# Patient Record
Sex: Male | Born: 1945
Health system: Southern US, Community
[De-identification: ages and names within clinical notes are randomized; demographics above are authoritative.]

## PROBLEM LIST (undated history)

## (undated) DIAGNOSIS — I2510 Atherosclerotic heart disease of native coronary artery without angina pectoris: Secondary | ICD-10-CM

## (undated) DIAGNOSIS — I10 Essential (primary) hypertension: Secondary | ICD-10-CM

## (undated) DIAGNOSIS — R079 Chest pain, unspecified: Secondary | ICD-10-CM

## (undated) DIAGNOSIS — N200 Calculus of kidney: Secondary | ICD-10-CM

## (undated) DIAGNOSIS — K760 Fatty (change of) liver, not elsewhere classified: Secondary | ICD-10-CM

## (undated) DIAGNOSIS — G629 Polyneuropathy, unspecified: Secondary | ICD-10-CM

## (undated) DIAGNOSIS — E785 Hyperlipidemia, unspecified: Secondary | ICD-10-CM

## (undated) HISTORY — DX: Essential (primary) hypertension: I10

## (undated) HISTORY — DX: Chest pain, unspecified: R07.9

## (undated) HISTORY — PX: CATARACT EXTRACTION: SUR2

## (undated) HISTORY — DX: Polyneuropathy, unspecified: G62.9

## (undated) HISTORY — PX: TONSILLECTOMY: SUR1361

## (undated) HISTORY — DX: Atherosclerotic heart disease of native coronary artery without angina pectoris: I25.10

## (undated) HISTORY — DX: Hyperlipidemia, unspecified: E78.5

## (undated) HISTORY — DX: Calculus of kidney: N20.0

---

## 2005-11-26 ENCOUNTER — Ambulatory Visit: Payer: Self-pay | Admitting: Gastroenterology

## 2007-03-16 DIAGNOSIS — G4733 Obstructive sleep apnea (adult) (pediatric): Secondary | ICD-10-CM | POA: Insufficient documentation

## 2007-03-16 HISTORY — DX: Obstructive sleep apnea (adult) (pediatric): G47.33

## 2010-02-12 HISTORY — PX: LEG SURGERY: SHX1003

## 2010-02-12 HISTORY — PX: FOOT SURGERY: SHX648

## 2019-03-07 DIAGNOSIS — Z20822 Contact with and (suspected) exposure to covid-19: Secondary | ICD-10-CM | POA: Diagnosis not present

## 2019-03-07 DIAGNOSIS — I10 Essential (primary) hypertension: Secondary | ICD-10-CM | POA: Diagnosis not present

## 2019-05-19 DIAGNOSIS — R7301 Impaired fasting glucose: Secondary | ICD-10-CM | POA: Diagnosis not present

## 2019-05-19 DIAGNOSIS — G4733 Obstructive sleep apnea (adult) (pediatric): Secondary | ICD-10-CM | POA: Diagnosis not present

## 2019-05-19 DIAGNOSIS — Z9989 Dependence on other enabling machines and devices: Secondary | ICD-10-CM | POA: Diagnosis not present

## 2019-05-19 DIAGNOSIS — I10 Essential (primary) hypertension: Secondary | ICD-10-CM | POA: Diagnosis not present

## 2019-05-19 DIAGNOSIS — Z125 Encounter for screening for malignant neoplasm of prostate: Secondary | ICD-10-CM | POA: Diagnosis not present

## 2019-05-19 DIAGNOSIS — E785 Hyperlipidemia, unspecified: Secondary | ICD-10-CM | POA: Diagnosis not present

## 2019-05-22 DIAGNOSIS — C44319 Basal cell carcinoma of skin of other parts of face: Secondary | ICD-10-CM | POA: Diagnosis not present

## 2019-06-23 DIAGNOSIS — M109 Gout, unspecified: Secondary | ICD-10-CM | POA: Diagnosis not present

## 2019-07-14 DIAGNOSIS — J301 Allergic rhinitis due to pollen: Secondary | ICD-10-CM | POA: Diagnosis not present

## 2019-08-06 DIAGNOSIS — N200 Calculus of kidney: Secondary | ICD-10-CM | POA: Diagnosis not present

## 2019-08-06 DIAGNOSIS — Z79899 Other long term (current) drug therapy: Secondary | ICD-10-CM | POA: Diagnosis not present

## 2019-08-06 DIAGNOSIS — I10 Essential (primary) hypertension: Secondary | ICD-10-CM | POA: Diagnosis not present

## 2019-08-06 DIAGNOSIS — R109 Unspecified abdominal pain: Secondary | ICD-10-CM | POA: Diagnosis not present

## 2019-08-06 DIAGNOSIS — E7800 Pure hypercholesterolemia, unspecified: Secondary | ICD-10-CM | POA: Diagnosis not present

## 2019-08-06 DIAGNOSIS — R5383 Other fatigue: Secondary | ICD-10-CM | POA: Diagnosis not present

## 2019-08-07 DIAGNOSIS — E860 Dehydration: Secondary | ICD-10-CM | POA: Diagnosis not present

## 2019-08-07 DIAGNOSIS — N201 Calculus of ureter: Secondary | ICD-10-CM | POA: Diagnosis not present

## 2019-08-07 DIAGNOSIS — R109 Unspecified abdominal pain: Secondary | ICD-10-CM | POA: Diagnosis not present

## 2019-08-07 DIAGNOSIS — R0600 Dyspnea, unspecified: Secondary | ICD-10-CM | POA: Diagnosis not present

## 2019-08-20 ENCOUNTER — Ambulatory Visit: Payer: Self-pay | Admitting: Cardiology

## 2019-08-25 ENCOUNTER — Telehealth: Payer: Self-pay | Admitting: Cardiology

## 2019-08-25 DIAGNOSIS — R079 Chest pain, unspecified: Secondary | ICD-10-CM | POA: Diagnosis not present

## 2019-08-25 DIAGNOSIS — I10 Essential (primary) hypertension: Secondary | ICD-10-CM | POA: Diagnosis not present

## 2019-08-25 DIAGNOSIS — N179 Acute kidney failure, unspecified: Secondary | ICD-10-CM | POA: Diagnosis not present

## 2019-08-25 DIAGNOSIS — E785 Hyperlipidemia, unspecified: Secondary | ICD-10-CM | POA: Diagnosis not present

## 2019-08-25 DIAGNOSIS — R0602 Shortness of breath: Secondary | ICD-10-CM | POA: Diagnosis not present

## 2019-08-25 DIAGNOSIS — Z7982 Long term (current) use of aspirin: Secondary | ICD-10-CM | POA: Diagnosis not present

## 2019-08-25 DIAGNOSIS — Z79899 Other long term (current) drug therapy: Secondary | ICD-10-CM | POA: Diagnosis not present

## 2019-08-25 DIAGNOSIS — I361 Nonrheumatic tricuspid (valve) insufficiency: Secondary | ICD-10-CM | POA: Diagnosis not present

## 2019-08-25 DIAGNOSIS — J8410 Pulmonary fibrosis, unspecified: Secondary | ICD-10-CM | POA: Diagnosis not present

## 2019-08-25 NOTE — Progress Notes (Deleted)
Cardiology Office Note:    Date:  08/25/2019   ID:  Phillip Terry, DOB 1945-04-28, MRN [id number]  PCP:  No primary care provider on file.  Cardiologist:  Norman Herrlich, MD   Referring MD: Paulina Fusi, MD  ASSESSMENT:    No diagnosis found. PLAN:    In order of problems listed above:  1. ***  Next appointment   Medication Adjustments/Labs and Tests Ordered: Current medicines are reviewed at length with the patient today.  Concerns regarding medicines are outlined above.  No orders of the defined types were placed in this encounter.  No orders of the defined types were placed in this encounter.    No chief complaint on file. ***  History of Present Illness:    Phillip Terry is a 74 y.o. male who is being seen today for the evaluation of shortness of breath at the request of Paulina Fusi, MD.   No past medical history on file.  *** The histories are not reviewed yet. Please review them in the "History" navigator section and refresh this SmartLink.  Current Medications: No outpatient medications have been marked as taking for the 08/26/19 encounter (Appointment) with Baldo Daub, MD.     Allergies:   Patient has no allergy information on record.   Social History   Socioeconomic History  . Marital status: Married    Spouse name: Not on file  . Number of children: Not on file  . Years of education: Not on file  . Highest education level: Not on file  Occupational History  . Not on file  Tobacco Use  . Smoking status: Not on file  Substance and Sexual Activity  . Alcohol use: Not on file  . Drug use: Not on file  . Sexual activity: Not on file  Other Topics Concern  . Not on file  Social History Narrative  . Not on file   Social Determinants of Health   Financial Resource Strain:   . Difficulty of Paying Living Expenses:   Food Insecurity:   . Worried About Programme researcher, broadcasting/film/video in the Last Year:   . Barista in the Last Year:     Transportation Needs:   . Freight forwarder (Medical):   [name] Lack of Transportation (Non-Medical):   Physical Activity:   . Days of Exercise per Week:   . Minutes of Exercise per Session:   Stress:   . Feeling of Stress :   Social Connections:   . Frequency of Communication with Friends and Family:   . Frequency of Social Gatherings with Friends and Family:   . Attends Religious Services:   . Active Member of Clubs or Organizations:   . Attends Banker Meetings:   [name] Marital Status:      Family History: The patient's ***family history is not on file.  ROS:   ROS Please see the history of present illness.    *** All other systems reviewed and are negative.  EKGs/Labs/Other Studies Reviewed:    The following studies were reviewed today: ***  EKG:  EKG is *** ordered today.  The ekg ordered today is personally reviewed and demonstrates ***  Recent Labs: No results found for requested labs within last 8760 hours.  Recent Lipid Panel No results found for: CHOL, TRIG, HDL, CHOLHDL, VLDL, LDLCALC, LDLDIRECT  Physical Exam:    VS:  There were no vitals taken for this visit.    Wt Readings from Last  3 Encounters:  No data found for Wt     GEN: *** Well nourished, well developed in no acute distress HEENT: Normal NECK: No JVD; No carotid bruits LYMPHATICS: No lymphadenopathy CARDIAC: ***RRR, no murmurs, rubs, gallops RESPIRATORY:  Clear to auscultation without rales, wheezing or rhonchi  ABDOMEN: Soft, non-tender, non-distended MUSCULOSKELETAL:  No edema; No deformity  SKIN: Warm and dry NEUROLOGIC:  Alert and oriented x 3 PSYCHIATRIC:  Normal affect     Signed, Norman Herrlich, MD  08/25/2019 10:07 AM    [facility]

## 2019-08-25 NOTE — Telephone Encounter (Signed)
New Message:     Wife called and wanted Dr Dulce Sellar know that pt have been admitted to [facility]. They are keeping him over night and will do a Stress Test tomorrow morning. She did not cancel his appt for tomorrow, in case he is discharged.

## 2019-08-26 ENCOUNTER — Ambulatory Visit: Payer: Self-pay | Admitting: Cardiology

## 2019-08-26 DIAGNOSIS — E785 Hyperlipidemia, unspecified: Secondary | ICD-10-CM | POA: Diagnosis not present

## 2019-08-26 DIAGNOSIS — I361 Nonrheumatic tricuspid (valve) insufficiency: Secondary | ICD-10-CM | POA: Diagnosis not present

## 2019-08-26 DIAGNOSIS — I10 Essential (primary) hypertension: Secondary | ICD-10-CM | POA: Diagnosis not present

## 2019-08-26 DIAGNOSIS — R0602 Shortness of breath: Secondary | ICD-10-CM | POA: Diagnosis not present

## 2019-08-26 DIAGNOSIS — R079 Chest pain, unspecified: Secondary | ICD-10-CM | POA: Diagnosis not present

## 2019-08-27 DIAGNOSIS — R079 Chest pain, unspecified: Secondary | ICD-10-CM | POA: Diagnosis not present

## 2019-08-27 DIAGNOSIS — R0600 Dyspnea, unspecified: Secondary | ICD-10-CM | POA: Diagnosis not present

## 2019-08-27 DIAGNOSIS — J8410 Pulmonary fibrosis, unspecified: Secondary | ICD-10-CM | POA: Diagnosis not present

## 2019-08-28 ENCOUNTER — Encounter: Payer: Self-pay | Admitting: *Deleted

## 2019-08-28 ENCOUNTER — Encounter: Payer: Self-pay | Admitting: Cardiology

## 2019-08-28 DIAGNOSIS — I10 Essential (primary) hypertension: Secondary | ICD-10-CM | POA: Insufficient documentation

## 2019-08-28 DIAGNOSIS — G629 Polyneuropathy, unspecified: Secondary | ICD-10-CM | POA: Insufficient documentation

## 2019-08-28 DIAGNOSIS — N200 Calculus of kidney: Secondary | ICD-10-CM | POA: Insufficient documentation

## 2019-08-28 DIAGNOSIS — C44311 Basal cell carcinoma of skin of nose: Secondary | ICD-10-CM | POA: Diagnosis not present

## 2019-08-28 DIAGNOSIS — E785 Hyperlipidemia, unspecified: Secondary | ICD-10-CM | POA: Insufficient documentation

## 2019-08-28 DIAGNOSIS — D171 Benign lipomatous neoplasm of skin and subcutaneous tissue of trunk: Secondary | ICD-10-CM | POA: Diagnosis not present

## 2019-09-03 DIAGNOSIS — N401 Enlarged prostate with lower urinary tract symptoms: Secondary | ICD-10-CM | POA: Diagnosis not present

## 2019-09-03 DIAGNOSIS — N200 Calculus of kidney: Secondary | ICD-10-CM | POA: Diagnosis not present

## 2019-09-15 NOTE — Progress Notes (Signed)
Cardiology Office Note:    Date:  09/16/2019   ID:  Phillip Terry, DOB 06/19/1945, MRN [id number]  PCP:  Paulina Fusi, MD  Cardiologist:  Norman Herrlich, MD   Referring MD: Paulina Fusi, MD  ASSESSMENT:    1. Pulmonary fibrosis (HCC)   2. Hypertension, unspecified type   3. Hyperlipidemia, unspecified hyperlipidemia type    PLAN:    In order of problems listed above:  1. He remains symptomatic with exertional shortness of breath evaluation showed pulmonary fibrosis strong exposure to asbestos in his life awaiting pulmonary evaluation and referred to pulmonary rehab to improve the quality of his life 2. Stable BP at target continue current ARB calcium channel blocker beta-blocker 3. Stable continue nonstatin  Next appointment I will see back in the office as needed   Medication Adjustments/Labs and Tests Ordered: Current medicines are reviewed at length with the patient today.  Concerns regarding medicines are outlined above.  Orders Placed This Encounter  Procedures  . EKG 12-Lead   No orders of the defined types were placed in this encounter.    Chief Complaint  Patient presents with  . Shortness of Breath    He had been at [facility] was seen by me had an evaluation showing no findings of heart failure and a normal myocardial perfusion test    History of Present Illness:    Phillip Terry is a 74 y.o. male who is being seen today for the evaluation of shortness of breath at the request of Paulina Fusi, MD.  Review of office records from his primary care physician 08/06/2009 had recent visit to [facility] ED leg pain.  At that time he is also complained of exertional shortness of breath.  It was noted that his proBNP level was normal and EKG in the physician's office was also read as normal.  There is also notation that he is being seen [facility] pulmonary consultation with a diagnosis of pulmonary fibrosis.  He has a history of obstructive  sleep apnea on CPAP.  He was seen by me at [facility] during admission 08/26/2019.  His problem was progressive severe exertional shortness of breath proBNP level was quite low at 62 echo showed normal left and right ventricular function no evidence of elevated pressures or pulmonary artery hypertension underwent a myocardial perfusion study showed an EF of 56% normal perfusion.  D-dimer was low.  CT of the chest showed pulmonary fibrosis also showed coronary artery calcification.  He is disturbed by his hospital experience and just is unsure what it is and what is not wrong.  I told him his cardiac testing was reassuring no evidence of heart failure or CAD.  He has an appointment to be seen by pulmonary at [facility] and when I take the opportunity refer him to pulmonary rehab to improve the quality of his life.  I do not think he had full PFTs at the hospital.  Past Medical History:  Diagnosis Date  . Calculus of kidney   . Hyperlipidemia   . Hypertension   . OSA on CPAP 03/16/2007   RDI 39 Sat 89%  . Peripheral neuropathy     Past Surgical History:  Procedure Laterality Date  . CATARACT EXTRACTION Bilateral   . FOOT SURGERY Left 2012   gastronemius slide procedure  . LEG SURGERY Left 2012   Gastronemius slide procedure  . TONSILLECTOMY      Current Medications: No outpatient medications have been marked  as taking for the 09/16/19 encounter (Office Visit) with Baldo Daub, MD.     Allergies:   Patient has no known allergies.   Social History   Socioeconomic History  . Marital status: Married    Spouse name: Not on file  . Number of children: Not on file  . Years of education: Not on file  . Highest education level: Not on file  Occupational History  . Not on file  Tobacco Use  . Smoking status: Never Smoker  . Smokeless tobacco: Never Used  Substance and Sexual Activity  . Alcohol use: Never  . Drug use: Never  . Sexual activity: Not on file  Other  Topics Concern  . Not on file  Social History Narrative  . Not on file   Social Determinants of Health   Financial Resource Strain:   . Difficulty of Paying Living Expenses:   Food Insecurity:   . Worried About Programme researcher, broadcasting/film/video in the Last Year:   . Barista in the Last Year:   Transportation Needs:   . Freight forwarder (Medical):   [name] Lack of Transportation (Non-Medical):   Physical Activity:   . Days of Exercise per Week:   . Minutes of Exercise per Session:   Stress:   . Feeling of Stress :   Social Connections:   . Frequency of Communication with Friends and Family:   . Frequency of Social Gatherings with Friends and Family:   . Attends Religious Services:   . Active Member of Clubs or Organizations:   . Attends Banker Meetings:   [name] Marital Status:      Family History: The patient's family history includes Breast cancer in his mother; Fibromyalgia in his sister.  ROS:   ROS Please see the history of present illness.     All other systems reviewed and are negative.  EKGs/Labs/Other Studies Reviewed:    The following studies were reviewed today:   EKG:  EKG is  ordered today.  The ekg ordered today is personally reviewed and demonstrates sinus rhythm normal    Physical Exam:    VS:  BP 122/72 (BP Location: Left Arm, Patient Position: Sitting, Cuff Size: Normal)   Pulse (!) 58   Ht 6\' 5"  (1.956 m)   Wt 249 lb (112.9 kg)   SpO2 95%   BMI 29.53 kg/m     Wt Readings from Last 3 Encounters:  09/16/19 249 lb (112.9 kg)  08/07/19 236 lb (107 kg)     GEN:  Well nourished, well developed in no acute distress HEENT: Normal NECK: No JVD; No carotid bruits LYMPHATICS: No lymphadenopathy CARDIAC: RRR, no murmurs, rubs, gallops RESPIRATORY:  Clear to auscultation without rales, wheezing or rhonchi  ABDOMEN: Soft, non-tender, non-distended MUSCULOSKELETAL:  No edema; No deformity  SKIN: Warm and dry NEUROLOGIC:  Alert and oriented x  3 PSYCHIATRIC:  Normal affect     Signed, 08/09/19, MD  09/16/2019 10:38 AM     [facility]

## 2019-09-16 ENCOUNTER — Encounter: Payer: Self-pay | Admitting: Cardiology

## 2019-09-16 ENCOUNTER — Ambulatory Visit: Payer: Medicare PPO | Admitting: Cardiology

## 2019-09-16 ENCOUNTER — Other Ambulatory Visit: Payer: Self-pay

## 2019-09-16 VITALS — BP 122/72 | HR 58 | Ht 77.0 in | Wt 249.0 lb

## 2019-09-16 DIAGNOSIS — J8410 Pulmonary fibrosis, unspecified: Secondary | ICD-10-CM | POA: Diagnosis not present

## 2019-09-16 DIAGNOSIS — E785 Hyperlipidemia, unspecified: Secondary | ICD-10-CM | POA: Diagnosis not present

## 2019-09-16 DIAGNOSIS — I10 Essential (primary) hypertension: Secondary | ICD-10-CM

## 2019-09-16 NOTE — Patient Instructions (Signed)

## 2019-09-29 DIAGNOSIS — J8410 Pulmonary fibrosis, unspecified: Secondary | ICD-10-CM | POA: Diagnosis not present

## 2019-09-30 DIAGNOSIS — J8410 Pulmonary fibrosis, unspecified: Secondary | ICD-10-CM | POA: Diagnosis not present

## 2019-10-02 DIAGNOSIS — J8410 Pulmonary fibrosis, unspecified: Secondary | ICD-10-CM | POA: Diagnosis not present

## 2019-10-05 DIAGNOSIS — J8410 Pulmonary fibrosis, unspecified: Secondary | ICD-10-CM | POA: Diagnosis not present

## 2019-10-07 DIAGNOSIS — J8410 Pulmonary fibrosis, unspecified: Secondary | ICD-10-CM | POA: Diagnosis not present

## 2019-10-09 DIAGNOSIS — J8410 Pulmonary fibrosis, unspecified: Secondary | ICD-10-CM | POA: Diagnosis not present

## 2019-10-12 DIAGNOSIS — J8410 Pulmonary fibrosis, unspecified: Secondary | ICD-10-CM | POA: Diagnosis not present

## 2019-10-14 DIAGNOSIS — J8410 Pulmonary fibrosis, unspecified: Secondary | ICD-10-CM | POA: Diagnosis not present

## 2019-10-16 DIAGNOSIS — J8410 Pulmonary fibrosis, unspecified: Secondary | ICD-10-CM | POA: Diagnosis not present

## 2019-10-20 DIAGNOSIS — J8410 Pulmonary fibrosis, unspecified: Secondary | ICD-10-CM | POA: Diagnosis not present

## 2019-10-21 DIAGNOSIS — J8410 Pulmonary fibrosis, unspecified: Secondary | ICD-10-CM | POA: Diagnosis not present

## 2019-10-23 DIAGNOSIS — J8410 Pulmonary fibrosis, unspecified: Secondary | ICD-10-CM | POA: Diagnosis not present

## 2019-10-26 DIAGNOSIS — J8410 Pulmonary fibrosis, unspecified: Secondary | ICD-10-CM | POA: Diagnosis not present

## 2019-10-27 DIAGNOSIS — Z9989 Dependence on other enabling machines and devices: Secondary | ICD-10-CM | POA: Diagnosis not present

## 2019-10-27 DIAGNOSIS — J8410 Pulmonary fibrosis, unspecified: Secondary | ICD-10-CM | POA: Diagnosis not present

## 2019-10-27 DIAGNOSIS — R0609 Other forms of dyspnea: Secondary | ICD-10-CM | POA: Insufficient documentation

## 2019-10-27 DIAGNOSIS — G4733 Obstructive sleep apnea (adult) (pediatric): Secondary | ICD-10-CM | POA: Diagnosis not present

## 2019-10-27 DIAGNOSIS — R0602 Shortness of breath: Secondary | ICD-10-CM | POA: Diagnosis not present

## 2019-10-27 HISTORY — DX: Other forms of dyspnea: R06.09

## 2019-10-27 HISTORY — DX: Pulmonary fibrosis, unspecified: J84.10

## 2019-10-28 DIAGNOSIS — J8410 Pulmonary fibrosis, unspecified: Secondary | ICD-10-CM | POA: Diagnosis not present

## 2019-10-30 DIAGNOSIS — J8410 Pulmonary fibrosis, unspecified: Secondary | ICD-10-CM | POA: Diagnosis not present

## 2019-11-02 DIAGNOSIS — J8410 Pulmonary fibrosis, unspecified: Secondary | ICD-10-CM | POA: Diagnosis not present

## 2019-11-03 DIAGNOSIS — K760 Fatty (change of) liver, not elsewhere classified: Secondary | ICD-10-CM | POA: Diagnosis not present

## 2019-11-03 DIAGNOSIS — J479 Bronchiectasis, uncomplicated: Secondary | ICD-10-CM | POA: Diagnosis not present

## 2019-11-03 DIAGNOSIS — R0600 Dyspnea, unspecified: Secondary | ICD-10-CM | POA: Diagnosis not present

## 2019-11-03 DIAGNOSIS — J8410 Pulmonary fibrosis, unspecified: Secondary | ICD-10-CM | POA: Diagnosis not present

## 2019-11-03 DIAGNOSIS — I700 Atherosclerosis of aorta: Secondary | ICD-10-CM | POA: Diagnosis not present

## 2019-11-03 DIAGNOSIS — I2510 Atherosclerotic heart disease of native coronary artery without angina pectoris: Secondary | ICD-10-CM | POA: Diagnosis not present

## 2019-11-03 DIAGNOSIS — J84112 Idiopathic pulmonary fibrosis: Secondary | ICD-10-CM | POA: Diagnosis not present

## 2019-11-04 DIAGNOSIS — J8410 Pulmonary fibrosis, unspecified: Secondary | ICD-10-CM | POA: Diagnosis not present

## 2019-11-09 DIAGNOSIS — J8410 Pulmonary fibrosis, unspecified: Secondary | ICD-10-CM | POA: Diagnosis not present

## 2019-11-11 DIAGNOSIS — J8410 Pulmonary fibrosis, unspecified: Secondary | ICD-10-CM | POA: Diagnosis not present

## 2019-11-13 DIAGNOSIS — J8410 Pulmonary fibrosis, unspecified: Secondary | ICD-10-CM | POA: Diagnosis not present

## 2019-11-16 DIAGNOSIS — J8410 Pulmonary fibrosis, unspecified: Secondary | ICD-10-CM | POA: Diagnosis not present

## 2019-11-18 DIAGNOSIS — J8410 Pulmonary fibrosis, unspecified: Secondary | ICD-10-CM | POA: Diagnosis not present

## 2019-11-20 DIAGNOSIS — J8410 Pulmonary fibrosis, unspecified: Secondary | ICD-10-CM | POA: Diagnosis not present

## 2019-11-23 DIAGNOSIS — J8410 Pulmonary fibrosis, unspecified: Secondary | ICD-10-CM | POA: Diagnosis not present

## 2019-11-25 DIAGNOSIS — J8410 Pulmonary fibrosis, unspecified: Secondary | ICD-10-CM | POA: Diagnosis not present

## 2019-11-27 DIAGNOSIS — J8410 Pulmonary fibrosis, unspecified: Secondary | ICD-10-CM | POA: Diagnosis not present

## 2019-12-07 DIAGNOSIS — J8410 Pulmonary fibrosis, unspecified: Secondary | ICD-10-CM | POA: Diagnosis not present

## 2019-12-09 DIAGNOSIS — J8410 Pulmonary fibrosis, unspecified: Secondary | ICD-10-CM | POA: Diagnosis not present

## 2019-12-11 DIAGNOSIS — J8410 Pulmonary fibrosis, unspecified: Secondary | ICD-10-CM | POA: Diagnosis not present

## 2019-12-14 DIAGNOSIS — J8410 Pulmonary fibrosis, unspecified: Secondary | ICD-10-CM | POA: Diagnosis not present

## 2019-12-16 DIAGNOSIS — J8410 Pulmonary fibrosis, unspecified: Secondary | ICD-10-CM | POA: Diagnosis not present

## 2019-12-18 DIAGNOSIS — J8410 Pulmonary fibrosis, unspecified: Secondary | ICD-10-CM | POA: Diagnosis not present

## 2019-12-20 NOTE — Progress Notes (Signed)
Cardiology Office Note:    Date:  12/21/2019   ID:  Phillip Terry, DOB June 07, 1945, MRN [id number]  PCP:  Paulina Fusi, MD  Cardiologist:  Norman Herrlich, MD    Referring MD: Paulina Fusi, MD    ASSESSMENT:    1. Pulmonary fibrosis (HCC)   2. Hyperlipidemia, unspecified hyperlipidemia type   3. Essential hypertension    PLAN:    In order of problems listed above:  1. He is improved after pulmonary rehab has pulmonary fibrosis on CT scan he had a marked exposure including time in the [employer] in the [employer].  I strongly encouraged him to continue his pulmonary rehab exercise which has had a marked increase in his quality of life 2. Stable BP at target continue treatment calcium channel blocker ARB beta-blocker 3. Continue fenofibrate recheck lipid profile CMP   Next appointment: 1 year   Medication Adjustments/Labs and Tests Ordered: Current medicines are reviewed at length with the patient today.  Concerns regarding medicines are outlined above.  Orders Placed This Encounter  Procedures   Comprehensive metabolic panel   Lipid panel   No orders of the defined types were placed in this encounter.   Chief Complaint  Patient presents with   Follow-up    After pulmonary rehab    History of Present Illness:    Phillip Terry is a 74 y.o. male with a hx of shortness of breath with pulmonary fibrosis hypertension and hyperlipidemia.  He was last seen 09/16/2019.. The patient called and requested this office visit.    He was seen by me at [facility] during admission 08/26/2019.  His problem was progressive severe exertional shortness of breath proBNP level was quite low at 62 echo showed normal left and right ventricular function no evidence of elevated pressures or pulmonary artery hypertension underwent a myocardial perfusion study showed an EF of 56% normal perfusion.  D-dimer was low.  CT of the chest showed pulmonary fibrosis also showed coronary artery  calcification.  He has been seen by pulmonary [facility] for his pulmonary fibrosis and obstructive sleep apnea.  He had noncontrast CT of the chest 11/03/2019 continued pulmonary fibrosis pattern of UIP coronary and aortic atherosclerosis and hepatic steatosis.  Compliance with diet, lifestyle and medications: Yes  He has come back and see me when he finished pulmonary rehab that there has been a marked improvement in quality of his life better exercise tolerance and less exertional shortness of breath think plans to continue supervised exercise.  No chest pain edema orthopnea palpitation.  Overdue for labs last performed 0 05/19/2019 cholesterol 141 LDL 77 triglycerides 177 LDL 34 A1c normal 5.9%. Past Medical History:  Diagnosis Date   Calculus of kidney    Hyperlipidemia    Hypertension    OSA on CPAP 03/16/2007   RDI 39 Sat 89%   Peripheral neuropathy     Past Surgical History:  Procedure Laterality Date   CATARACT EXTRACTION Bilateral    FOOT SURGERY Left 2012   gastronemius slide procedure   LEG SURGERY Left 2012   Gastronemius slide procedure   TONSILLECTOMY      Current Medications: Current Meds  Medication Sig   amLODipine-valsartan (EXFORGE) 10-320 MG tablet Take 1 tablet by mouth at bedtime.   aspirin 81 MG chewable tablet Chew 81 mg by mouth daily.   fenofibrate 160 MG tablet 160 mg daily.   metoprolol tartrate (LOPRESSOR) 50 MG tablet 50 mg in the morning and at bedtime.  Allergies:   Patient has no known allergies.   Social History   Socioeconomic History   Marital status: Married    Spouse name: Not on file   Number of children: Not on file   Years of education: Not on file   Highest education level: Not on file  Occupational History   Not on file  Tobacco Use   Smoking status: Never Smoker   Smokeless tobacco: Never Used  Substance and Sexual Activity   Alcohol use: Never   Drug use: Never   Sexual activity: Not  on file  Other Topics Concern   Not on file  Social History Narrative   Not on file   Social Determinants of Health   Financial Resource Strain:    Difficulty of Paying Living Expenses: Not on file  Food Insecurity:    Worried About Running Out of Food in the Last Year: Not on file   Ran Out of Food in the Last Year: Not on file  Transportation Needs:    Lack of Transportation (Medical): Not on file   Lack of Transportation (Non-Medical): Not on file  Physical Activity:    Days of Exercise per Week: Not on file   Minutes of Exercise per Session: Not on file  Stress:    Feeling of Stress : Not on file  Social Connections:    Frequency of Communication with Friends and Family: Not on file   Frequency of Social Gatherings with Friends and Family: Not on file   Attends Religious Services: Not on file   Active Member of Clubs or Organizations: Not on file   Attends Banker Meetings: Not on file   Marital Status: Not on file     Family History: The patient's family history includes Breast cancer in his mother; Fibromyalgia in his sister. ROS:   Please see the history of present illness.    All other systems reviewed and are negative.  EKGs/Labs/Other Studies Reviewed:    The following studies were reviewed today:   Physical Exam:    VS:  BP 135/74    Pulse 76    Ht 6\' 5"  (1.956 m)    Wt 249 lb 3.2 oz (113 kg)    SpO2 96%    BMI 29.55 kg/m     Wt Readings from Last 3 Encounters:  12/21/19 249 lb 3.2 oz (113 kg)  09/16/19 249 lb (112.9 kg)  08/07/19 236 lb (107 kg)     GEN: He appears much healthier well nourished, well developed in no acute distress HEENT: Normal NECK: No JVD; No carotid bruits LYMPHATICS: No lymphadenopathy CARDIAC: RRR, no murmurs, rubs, gallops RESPIRATORY:  Clear to auscultation without rales, wheezing or rhonchi  ABDOMEN: Soft, non-tender, non-distended MUSCULOSKELETAL:  No edema; No deformity  SKIN: Warm and  dry NEUROLOGIC:  Alert and oriented x 3 PSYCHIATRIC:  Normal affect    Signed, 08/09/19, MD  12/21/2019 2:38 PM    [facility]

## 2019-12-21 ENCOUNTER — Other Ambulatory Visit: Payer: Self-pay

## 2019-12-21 ENCOUNTER — Ambulatory Visit: Payer: Medicare PPO | Admitting: Cardiology

## 2019-12-21 ENCOUNTER — Encounter: Payer: Self-pay | Admitting: Cardiology

## 2019-12-21 VITALS — BP 135/74 | HR 76 | Ht 77.0 in | Wt 249.2 lb

## 2019-12-21 DIAGNOSIS — J8410 Pulmonary fibrosis, unspecified: Secondary | ICD-10-CM | POA: Diagnosis not present

## 2019-12-21 DIAGNOSIS — E785 Hyperlipidemia, unspecified: Secondary | ICD-10-CM

## 2019-12-21 DIAGNOSIS — I10 Essential (primary) hypertension: Secondary | ICD-10-CM

## 2019-12-21 NOTE — Patient Instructions (Signed)

## 2019-12-22 LAB — COMPREHENSIVE METABOLIC PANEL
ALT: 18 IU/L (ref 0–44)
AST: 20 IU/L (ref 0–40)
Albumin/Globulin Ratio: 1.8 (ref 1.2–2.2)
Albumin: 4.5 g/dL (ref 3.7–4.7)
Alkaline Phosphatase: 85 IU/L (ref 44–121)
BUN/Creatinine Ratio: 17 (ref 10–24)
BUN: 19 mg/dL (ref 8–27)
Bilirubin Total: 0.3 mg/dL (ref 0.0–1.2)
CO2: 21 mmol/L (ref 20–29)
Calcium: 9.6 mg/dL (ref 8.6–10.2)
Chloride: 101 mmol/L (ref 96–106)
Creatinine, Ser: 1.13 mg/dL (ref 0.76–1.27)
GFR calc Af Amer: 75 mL/min/{1.73_m2} (ref >59)
GFR calc non Af Amer: 65 mL/min/{1.73_m2} (ref >59)
Globulin, Total: 2.5 g/dL (ref 1.5–4.5)
Glucose: 138 mg/dL — ABNORMAL HIGH (ref 65–99)
Potassium: 4.4 mmol/L (ref 3.5–5.2)
Sodium: 137 mmol/L (ref 134–144)
Total Protein: 7 g/dL (ref 6.0–8.5)

## 2019-12-22 LAB — LIPID PANEL
Chol/HDL Ratio: 6.2 ratio — ABNORMAL HIGH (ref 0.0–5.0)
Cholesterol, Total: 156 mg/dL (ref 100–199)
HDL: 25 mg/dL — ABNORMAL LOW (ref >39)
LDL Chol Calc (NIH): 60 mg/dL (ref 0–99)
Triglycerides: 466 mg/dL — ABNORMAL HIGH (ref 0–149)
VLDL Cholesterol Cal: 71 mg/dL — ABNORMAL HIGH (ref 5–40)

## 2019-12-23 ENCOUNTER — Telehealth: Payer: Self-pay

## 2019-12-23 DIAGNOSIS — J8410 Pulmonary fibrosis, unspecified: Secondary | ICD-10-CM | POA: Diagnosis not present

## 2019-12-23 DIAGNOSIS — E785 Hyperlipidemia, unspecified: Secondary | ICD-10-CM

## 2019-12-23 MED ORDER — OMEGA-3-ACID ETHYL ESTERS 1 G PO CAPS
2.0000 g | ORAL_CAPSULE | Freq: Two times a day (BID) | ORAL | 3 refills | Status: DC
Start: 2019-12-23 — End: 2020-01-28

## 2019-12-23 NOTE — Telephone Encounter (Signed)
Left message on patients voicemail to please return our call.   

## 2019-12-23 NOTE — Telephone Encounter (Signed)
Spoke with patient regarding results and recommendation.  Patient verbalizes understanding and is agreeable to plan of care. Advised patient to call back with any issues or concerns.  

## 2019-12-23 NOTE — Telephone Encounter (Signed)
Corrie Dandy is returning [employer].

## 2019-12-23 NOTE — Addendum Note (Signed)
Addended by: Delorse Limber I on: 12/23/2019 04:17 PM   Modules accepted: Orders

## 2019-12-23 NOTE — Telephone Encounter (Signed)
-----   Message from Baldo Daub, MD sent at 12/22/2019  4:44 PM EST ----- His triglycerides are quite high  Asked him to take Lovaza 2 g twice daily and will recheck in about 6 weeks

## 2019-12-25 DIAGNOSIS — J8410 Pulmonary fibrosis, unspecified: Secondary | ICD-10-CM | POA: Diagnosis not present

## 2019-12-28 DIAGNOSIS — J8410 Pulmonary fibrosis, unspecified: Secondary | ICD-10-CM | POA: Diagnosis not present

## 2020-01-21 DIAGNOSIS — M109 Gout, unspecified: Secondary | ICD-10-CM | POA: Diagnosis not present

## 2020-01-28 ENCOUNTER — Other Ambulatory Visit: Payer: Self-pay | Admitting: Cardiology

## 2020-02-26 ENCOUNTER — Other Ambulatory Visit: Payer: Self-pay

## 2020-02-26 DIAGNOSIS — L821 Other seborrheic keratosis: Secondary | ICD-10-CM | POA: Diagnosis not present

## 2020-02-26 DIAGNOSIS — E785 Hyperlipidemia, unspecified: Secondary | ICD-10-CM | POA: Diagnosis not present

## 2020-02-26 DIAGNOSIS — C44319 Basal cell carcinoma of skin of other parts of face: Secondary | ICD-10-CM | POA: Diagnosis not present

## 2020-02-27 LAB — LIPID PANEL
Chol/HDL Ratio: 4 ratio (ref 0.0–5.0)
Cholesterol, Total: 155 mg/dL (ref 100–199)
HDL: 39 mg/dL — ABNORMAL LOW (ref >39)
LDL Chol Calc (NIH): 91 mg/dL (ref 0–99)
Triglycerides: 143 mg/dL (ref 0–149)
VLDL Cholesterol Cal: 25 mg/dL (ref 5–40)

## 2020-02-29 ENCOUNTER — Other Ambulatory Visit: Payer: Self-pay | Admitting: Cardiology

## 2020-02-29 ENCOUNTER — Telehealth: Payer: Self-pay

## 2020-02-29 NOTE — Telephone Encounter (Signed)
Left message on patients voicemail to please return our call.   

## 2020-02-29 NOTE — Telephone Encounter (Signed)
-----   Message from Baldo Daub, MD sent at 02/27/2020  1:39 PM EST ----- Randie Heinz result his lipids are ideal no change in treatment

## 2020-03-02 ENCOUNTER — Other Ambulatory Visit: Payer: Self-pay | Admitting: Cardiology

## 2020-03-02 NOTE — Telephone Encounter (Signed)
Refill sent to pharmacy.   

## 2020-04-14 DIAGNOSIS — G4733 Obstructive sleep apnea (adult) (pediatric): Secondary | ICD-10-CM | POA: Diagnosis not present

## 2020-05-14 DIAGNOSIS — R7301 Impaired fasting glucose: Secondary | ICD-10-CM | POA: Diagnosis not present

## 2020-05-14 DIAGNOSIS — E785 Hyperlipidemia, unspecified: Secondary | ICD-10-CM | POA: Diagnosis not present

## 2020-05-14 DIAGNOSIS — G4733 Obstructive sleep apnea (adult) (pediatric): Secondary | ICD-10-CM | POA: Diagnosis not present

## 2020-05-14 DIAGNOSIS — I10 Essential (primary) hypertension: Secondary | ICD-10-CM | POA: Diagnosis not present

## 2020-05-14 DIAGNOSIS — Z0000 Encounter for general adult medical examination without abnormal findings: Secondary | ICD-10-CM | POA: Diagnosis not present

## 2020-05-14 DIAGNOSIS — Z9989 Dependence on other enabling machines and devices: Secondary | ICD-10-CM | POA: Diagnosis not present

## 2020-05-14 DIAGNOSIS — Z125 Encounter for screening for malignant neoplasm of prostate: Secondary | ICD-10-CM | POA: Diagnosis not present

## 2020-05-14 DIAGNOSIS — J61 Pneumoconiosis due to asbestos and other mineral fibers: Secondary | ICD-10-CM | POA: Diagnosis not present

## 2020-05-23 ENCOUNTER — Telehealth: Payer: Self-pay | Admitting: Cardiology

## 2020-05-23 NOTE — Telephone Encounter (Signed)
Paitent called in requesting that Dr. Dulce Sellar can send a letter to [facility] stating that at least 50% of the patient medical issues come from the service

## 2020-05-23 NOTE — Telephone Encounter (Signed)
Left message for patient to return call.

## 2020-05-23 NOTE — Telephone Encounter (Signed)
Pt wants to know if Dr. Dulce Sellar thinks his Cystic Fibrosis was caused from being on a ship... please call to advise  [phone]  Thank you!

## 2020-05-23 NOTE — Telephone Encounter (Signed)
I prefer not to do this expert opinion I think it be best delivered by the pulmonary physician with seen him as an outpatient.

## 2020-05-24 NOTE — Telephone Encounter (Signed)
Called patient informed him of Dr. Hulen Shouts message below. He understood. No further questions.

## 2020-05-24 NOTE — Telephone Encounter (Signed)
Patient called and informed this should come from pulmonary doctor. No further questions.

## 2020-05-24 NOTE — Telephone Encounter (Signed)
Patient was on a ship for 15 months in 1970. He wants to know if Dr. Dulce Sellar thinks this caused his cystic fibrosis. Will check with Dr. Dulce Sellar.

## 2020-05-24 NOTE — Telephone Encounter (Signed)
This is a 2 part answer  First, I do not do expert witness reports with patients in my practice  The second is the issue if not cardiac and I am uncomfortable with what was asked of me from my field of practice  He has been seen by pulmonary physician I agree for him back I know that he has lung scarring has a long history lifelong asbestos exposure.

## 2020-05-30 ENCOUNTER — Other Ambulatory Visit: Payer: Self-pay | Admitting: Cardiology

## 2020-05-30 NOTE — Telephone Encounter (Signed)
Omega-3 acid ethyl esters ( Lovaza ) 1 g capsules # 360 x 3 additional refills to pharmacy

## 2020-07-22 DIAGNOSIS — M109 Gout, unspecified: Secondary | ICD-10-CM | POA: Diagnosis not present

## 2020-07-22 DIAGNOSIS — R079 Chest pain, unspecified: Secondary | ICD-10-CM | POA: Diagnosis not present

## 2020-07-25 ENCOUNTER — Encounter: Payer: Self-pay | Admitting: *Deleted

## 2020-07-25 ENCOUNTER — Encounter: Payer: Self-pay | Admitting: Cardiology

## 2020-08-16 DIAGNOSIS — M109 Gout, unspecified: Secondary | ICD-10-CM | POA: Diagnosis not present

## 2020-08-21 NOTE — Progress Notes (Signed)
Cardiology Office Note:    Date:  08/22/2020   ID:  Phillip Terry, DOB Jun 29, 1945, MRN [id number]  PCP:  Paulina Fusi, MD  Cardiologist:  Norman Herrlich, MD    Referring MD: Paulina Fusi, MD    ASSESSMENT:    1. Precordial pain   2. Pulmonary fibrosis (HCC)   3. Essential hypertension   4. Hyperlipidemia, unspecified hyperlipidemia type    PLAN:    In order of problems listed above:  He is having symptoms consistent with angina occurring frequently he has had a previous normal perfusion study and I think he is best served for further evaluation especially with his coronary calcification by cardiac CTA and be set up in a facilitated fashion.  He has no dye allergy and is resting heart rate is 58 bpm Stable he is functioning well has been evaluated by pulmonary and I do not think chest pain is related to his chronic lung disease Hypertension is stable blood pressure at target continue treatment calcium channel blocker ARB and beta-blocker Continue nonstatin lipids at target   Next appointment: 6 weeks   Medication Adjustments/Labs and Tests Ordered: Current medicines are reviewed at length with the patient today.  Concerns regarding medicines are outlined above.  No orders of the defined types were placed in this encounter.  No orders of the defined types were placed in this encounter.   No chief complaint on file.   History of Present Illness:    Phillip Terry is a 75 y.o. male with a hx of idiopathic pulmonary fibrosis hypertension and hyperlipidemia last seen 12/21/2019.He was seen by me at [facility] during admission 08/26/2019.  His problem was progressive severe exertional shortness of breath proBNP level was quite low at 62 echo showed normal left and right ventricular function no evidence of elevated pressures or pulmonary artery hypertension underwent a myocardial perfusion study showed an EF of 56% normal perfusion.  D-dimer was low.  CT of the chest showed  pulmonary fibrosis and also showed coronary artery calcification.  He has been seen by pulmonary [facility] for his sleep apnea and pulmonary fibrosis and noncontrast CT of the chest September 2021 showed continue pulmonary fibrosis pattern of UIP with coronary and aortic atherosclerosis and hepatic steatosis.  Compliance with diet, lifestyle and medications: Yes  He continues to farm. Recently has been having frequent episodes several times a week of substernal chest pain pressure radiates into both shoulders and arms and is relieved in a few minutes with rest.  He was seen by his PCP and referred back to my office.  He was given a prescription for nitroglycerin but has not taken it yet.  He had fortunately has not had episodes at night or at rest.  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  He is not coughing or wheezing no edema orthopnea or syncope.  He was seen by his PCP 07/22/2020 with exertional chest pain. Past Medical History:  Diagnosis Date   Calculus of kidney    Chest pain    DOE (dyspnea on exertion) 10/27/2019   Essential hypertension    Hyperlipidemia    OSA on CPAP 03/16/2007   RDI 39 Sat 89%   Peripheral neuropathy    Pulmonary fibrosis (HCC) 10/27/2019    Past Surgical History:  Procedure Laterality Date   CATARACT EXTRACTION Bilateral    FOOT SURGERY Left 2012  gastronemius slide procedure   LEG SURGERY Left 2012   Gastronemius slide procedure   TONSILLECTOMY      Current Medications: Current Meds  Medication Sig   albuterol (VENTOLIN HFA) 108 (90 Base) MCG/ACT inhaler Inhale 2 puffs into the lungs every 4 (four) hours as needed for shortness of breath.   amLODipine-valsartan (EXFORGE) 10-320 MG tablet Take 1 tablet by mouth at bedtime.   aspirin 81 MG chewable tablet Chew 81 mg by mouth daily.   febuxostat (ULORIC) 40 MG tablet Take 40 mg by mouth daily.    fenofibrate 160 MG tablet Take 160 mg by mouth daily.   metoprolol tartrate (LOPRESSOR) 50 MG tablet Take 50 mg by mouth in the morning and at bedtime.   omega-3 acid ethyl esters (LOVAZA) 1 g capsule Take 2 g by mouth 2 (two) times daily.     Allergies:   Patient has no known allergies.   Social History   Socioeconomic History   Marital status: Married    Spouse name: Not on file   Number of children: Not on file   Years of education: Not on file   Highest education level: Not on file  Occupational History   Not on file  Tobacco Use   Smoking status: Never   Smokeless tobacco: Never  Vaping Use   Vaping Use: Never used  Substance and Sexual Activity   Alcohol use: Never   Drug use: Never   Sexual activity: Not on file  Other Topics Concern   Not on file  Social History Narrative   Not on file   Social Determinants of Health   Financial Resource Strain: Not on file  Food Insecurity: Not on file  Transportation Needs: Not on file  Physical Activity: Not on file  Stress: Not on file  Social Connections: Not on file     Family History: The patient's family history includes Breast cancer in his mother; CAD in his father; Fibromyalgia in his sister. ROS:   Please see the history of present illness.    All other systems reviewed and are negative.  EKGs/Labs/Other Studies Reviewed:    The following studies were reviewed today:  EKG:  EKG ordered today and personally reviewed.  The ekg ordered today demonstrates sinus rhythm left anterior hemiblock 58 bpm  Recent Labs:  Most recent labs PCP 05/14/2020 Cholesterol 140 LDL 80 triglycerides 173 HDL 30 A1c 6.4% hemoglobin 16.2 creatinine 1.15  12/21/2019: ALT 18; BUN 19; Creatinine, Ser 1.13; Potassium 4.4; Sodium 137  Recent Lipid Panel    Component Value Date/Time   CHOL 155 02/26/2020 0808   TRIG 143 02/26/2020 0808   HDL 39 (L) 02/26/2020 0808   CHOLHDL 4.0 02/26/2020 0808   LDLCALC 91 02/26/2020 0808     Physical Exam:    VS:  BP 130/70 (BP Location: Right Arm, Patient Position: Sitting)   Pulse (!) 58   Ht 6' (1.829 m)   Wt 246 lb 6.4 oz (111.8 kg)   SpO2 97%   BMI 33.42 kg/m     Wt Readings from Last 3 Encounters:  08/22/20 246 lb 6.4 oz (111.8 kg)  07/25/20 256 lb (116.1 kg)  12/21/19 249 lb 3.2 oz (113 kg)     GEN:  Well nourished, well developed in no acute distress he does not look chronically ill or debilitated HEENT: Normal NECK: No JVD; No carotid bruits LYMPHATICS: No lymphadenopathy CARDIAC: RRR, no murmurs, rubs, gallops RESPIRATORY:  Clear to auscultation without rales, wheezing  or rhonchi  ABDOMEN: Soft, non-tender, non-distended MUSCULOSKELETAL:  No edema; No deformity  SKIN: Warm and dry NEUROLOGIC:  Alert and oriented x 3 PSYCHIATRIC:  Normal affect    Signed, Norman Herrlich, MD  08/22/2020 10:19 AM    [facility]

## 2020-08-22 ENCOUNTER — Ambulatory Visit: Payer: Medicare PPO | Admitting: Cardiology

## 2020-08-22 ENCOUNTER — Encounter: Payer: Self-pay | Admitting: Cardiology

## 2020-08-22 ENCOUNTER — Other Ambulatory Visit: Payer: Self-pay

## 2020-08-22 VITALS — BP 130/70 | HR 58 | Ht 72.0 in | Wt 246.4 lb

## 2020-08-22 DIAGNOSIS — R072 Precordial pain: Secondary | ICD-10-CM | POA: Diagnosis not present

## 2020-08-22 DIAGNOSIS — J8410 Pulmonary fibrosis, unspecified: Secondary | ICD-10-CM | POA: Diagnosis not present

## 2020-08-22 DIAGNOSIS — I10 Essential (primary) hypertension: Secondary | ICD-10-CM

## 2020-08-22 DIAGNOSIS — E785 Hyperlipidemia, unspecified: Secondary | ICD-10-CM | POA: Diagnosis not present

## 2020-08-22 DIAGNOSIS — R079 Chest pain, unspecified: Secondary | ICD-10-CM | POA: Insufficient documentation

## 2020-08-22 HISTORY — DX: Chest pain, unspecified: R07.9

## 2020-08-22 NOTE — Patient Instructions (Signed)
Medication Instructions:  No medication changes. *If you need a refill on your cardiac medications before your next appointment, please call your pharmacy*   Lab Work: Your physician recommends that you have a BMET done today in the office.  If you have labs (blood work) drawn today and your tests are completely normal, you will receive your results only by: [place] (if you have MyChart) OR A paper copy in the mail If you have any lab test that is abnormal or we need to change your treatment, we will call you to review the results.   Testing/Procedures: Your cardiac CT will be scheduled at:   [facility] [place], [place] [address] [phone]   If scheduled at [facility], please arrive at the [facility] main entrance of [facility] 30 minutes prior to test start time. Proceed to the [facility] Radiology Department (first floor) to check-in and test prep.  Please follow these instructions carefully (unless otherwise directed):   On the Night Before the Test: Be sure to Drink plenty of water. Do not consume any caffeinated/decaffeinated beverages or chocolate 12 hours prior to your test. Do not take any antihistamines 12 hours prior to your test.   On the Day of the Test: Drink plenty of water. Do not drink any water within one hour of the test. Do not eat any food 4 hours prior to the test. You may take your regular medications prior to the test.  Take metoprolol (Lopressor) two hours prior to test.      After the Test: Drink plenty of water. After receiving IV contrast, you may experience a mild flushed feeling. This is normal. On occasion, you may experience a mild rash up to 24 hours after the test. This is not dangerous. If this occurs, you can take Benadryl 25 mg and increase your fluid intake. If you experience trouble breathing, this can be serious. If it is severe call 911 IMMEDIATELY. If it is mild, please  call our office. If you take any of these medications: Glipizide/Metformin, Avandament, Glucavance, please do not take 48 hours after completing test unless otherwise instructed.   Once we have confirmed authorization from your insurance company, we will call you to set up a date and time for your test. Based on how quickly your insurance processes prior authorizations requests, please allow up to 4 weeks to be contacted for scheduling your Cardiac CT appointment. Be advised that routine Cardiac CT appointments could be scheduled as many as 8 weeks after your provider has ordered it.  For non-scheduling related questions, please contact the cardiac imaging nurse navigator should you have any questions/concerns: Marchia Bond, Cardiac Imaging Nurse Navigator Burley Saver, Interim Cardiac Imaging Nurse [place] [facility] Direct Office Dial: [phone]   For scheduling needs, including cancellations and rescheduling, please call Vivien Rota at [phone].     Follow-Up: At [facility], you and your health needs are our priority.  As part of our continuing mission to provide you with exceptional heart care, we have created designated Provider Care Teams.  These Care Teams include your primary Cardiologist (physician) and Advanced Practice Providers (APPs -  Physician Assistants and Nurse Practitioners) who all work together to provide you with the care you need, when you need it.  We recommend signing up for the patient portal called "MyChart".  Sign up information is provided on this After Visit Summary.  MyChart is used to connect with  patients for Virtual Visits (Telemedicine).  Patients are able to view lab/test results, encounter notes, upcoming appointments, etc.  Non-urgent messages can be sent to your provider as well.   To learn more about what you can do with MyChart, go to [email or web address].    Your next appointment:   6 week(s)  The format for your next  appointment:   In Person  Provider:   Shirlee More, MD   Other Instructions Cardiac CT Angiogram A cardiac CT angiogram is a procedure to look at the heart and the area around the heart. It may be done to help find the cause of chest pains or other symptoms of heart disease. During this procedure, a substance called contrast dye is injected into the blood vessels in the area to be checked. A large X-ray machine, called a CT scanner, then takes detailed pictures of the heart and the surrounding area. The procedure is also sometimes called a coronary CT angiogram, coronary artery scanning, or CTA. A cardiac CT angiogram allows the health care provider to see how well blood is flowing to and from the heart. The health care provider will be able to see if there are any problems, such as: Blockage or narrowing of the coronary arteries in the heart. Fluid around the heart. Signs of weakness or disease in the muscles, valves, and tissues of the heart. Tell a health care provider about: Any allergies you have. This is especially important if you have had a previous allergic reaction to contrast dye. All medicines you are taking, including vitamins, herbs, eye drops, creams, and over-the-counter medicines. Any blood disorders you have. Any surgeries you have had. Any medical conditions you have. Whether you are pregnant or may be pregnant. Any anxiety disorders, chronic pain, or other conditions you have that may increase your stress or prevent you from lying still. What are the risks? Generally, this is a safe procedure. However, problems may occur, including: Bleeding. Infection. Allergic reactions to medicines or dyes. Damage to other structures or organs. Kidney damage from the contrast dye that is used. Increased risk of cancer from radiation exposure. This risk is low. Talk with your health care provider about: The risks and benefits of testing. How you can receive the lowest dose of  radiation. What happens before the procedure? Wear comfortable clothing and remove any jewelry, glasses, dentures, and hearing aids. Follow instructions from your health care provider about eating and drinking. This may include: For 12 hours before the procedure -- avoid caffeine. This includes tea, coffee, soda, energy drinks, and diet pills. Drink plenty of water or other fluids that do not have caffeine in them. Being well hydrated can prevent complications. For 4-6 hours before the procedure -- stop eating and drinking. The contrast dye can cause nausea, but this is less likely if your stomach is empty. Ask your health care provider about changing or stopping your regular medicines. This is especially important if you are taking diabetes medicines, blood thinners, or medicines to treat problems with erections (erectile dysfunction). What happens during the procedure?  Hair on your chest may need to be removed so that small sticky patches called electrodes can be placed on your chest. These will transmit information that helps to monitor your heart during the procedure. An IV will be inserted into one of your veins. You might be given a medicine to control your heart rate during the procedure. This will help to ensure that good images are obtained. You will be asked to  lie on an exam table. This table will slide in and out of the CT machine during the procedure. Contrast dye will be injected into the IV. You might feel warm, or you may get a metallic taste in your mouth. You will be given a medicine called nitroglycerin. This will relax or dilate the arteries in your heart. The table that you are lying on will move into the CT machine tunnel for the scan. The person running the machine will give you instructions while the scans are being done. You may be asked to: Keep your arms above your head. Hold your breath. Stay very still, even if the table is moving. When the scanning is complete, you  will be moved out of the machine. The IV will be removed. The procedure may vary among health care providers and hospitals. What can I expect after the procedure? After your procedure, it is common to have: A metallic taste in your mouth from the contrast dye. A feeling of warmth. A headache from the nitroglycerin. Follow these instructions at home: Take over-the-counter and prescription medicines only as told by your health care provider. If you are told, drink enough fluid to keep your urine pale yellow. This will help to flush the contrast dye out of your body. Most people can return to their normal activities right after the procedure. Ask your health care provider what activities are safe for you. It is up to you to get the results of your procedure. Ask your health care provider, or the department that is doing the procedure, when your results will be ready. Keep all follow-up visits as told by your health care provider. This is important. Contact a health care provider if: You have any symptoms of allergy to the contrast dye. These include: Shortness of breath. Rash or hives. A racing heartbeat. Summary A cardiac CT angiogram is a procedure to look at the heart and the area around the heart. It may be done to help find the cause of chest pains or other symptoms of heart disease. During this procedure, a large X-ray machine, called a CT scanner, takes detailed pictures of the heart and the surrounding area after a contrast dye has been injected into blood vessels in the area. Ask your health care provider about changing or stopping your regular medicines before the procedure. This is especially important if you are taking diabetes medicines, blood thinners, or medicines to treat erectile dysfunction. If you are told, drink enough fluid to keep your urine pale yellow. This will help to flush the contrast dye out of your body. This information is not intended to replace advice given to  you by your health care provider. Make sure you discuss any questions you have with your health care provider. Document Revised: 09/24/2018 Document Reviewed: 09/24/2018 Elsevier Patient Education  [place].

## 2020-08-23 LAB — BASIC METABOLIC PANEL
BUN/Creatinine Ratio: 19 (ref 10–24)
BUN: 22 mg/dL (ref 8–27)
CO2: 19 mmol/L — ABNORMAL LOW (ref 20–29)
Calcium: 9.5 mg/dL (ref 8.6–10.2)
Chloride: 100 mmol/L (ref 96–106)
Creatinine, Ser: 1.14 mg/dL (ref 0.76–1.27)
Glucose: 90 mg/dL (ref 65–99)
Potassium: 4.5 mmol/L (ref 3.5–5.2)
Sodium: 134 mmol/L (ref 134–144)
eGFR: 68 mL/min/{1.73_m2} (ref >59)

## 2020-08-25 DIAGNOSIS — D125 Benign neoplasm of sigmoid colon: Secondary | ICD-10-CM | POA: Diagnosis not present

## 2020-08-25 DIAGNOSIS — K635 Polyp of colon: Secondary | ICD-10-CM | POA: Diagnosis not present

## 2020-08-25 DIAGNOSIS — D122 Benign neoplasm of ascending colon: Secondary | ICD-10-CM | POA: Diagnosis not present

## 2020-08-25 DIAGNOSIS — Z86010 Personal history of colonic polyps: Secondary | ICD-10-CM | POA: Diagnosis not present

## 2020-08-25 DIAGNOSIS — G4730 Sleep apnea, unspecified: Secondary | ICD-10-CM | POA: Diagnosis not present

## 2020-08-25 DIAGNOSIS — D123 Benign neoplasm of transverse colon: Secondary | ICD-10-CM | POA: Diagnosis not present

## 2020-08-25 DIAGNOSIS — K5730 Diverticulosis of large intestine without perforation or abscess without bleeding: Secondary | ICD-10-CM | POA: Diagnosis not present

## 2020-08-25 DIAGNOSIS — Z1211 Encounter for screening for malignant neoplasm of colon: Secondary | ICD-10-CM | POA: Diagnosis not present

## 2020-08-25 DIAGNOSIS — D124 Benign neoplasm of descending colon: Secondary | ICD-10-CM | POA: Diagnosis not present

## 2020-09-03 ENCOUNTER — Other Ambulatory Visit: Payer: Self-pay | Admitting: Cardiology

## 2020-09-09 ENCOUNTER — Telehealth (HOSPITAL_COMMUNITY): Payer: Self-pay | Admitting: Emergency Medicine

## 2020-09-09 NOTE — Telephone Encounter (Signed)
Reaching out to patient to offer assistance regarding upcoming cardiac imaging study; pt verbalizes understanding of appt date/time, parking situation and where to check in, pre-test NPO status and medications ordered, and verified current allergies; name and call back number provided for further questions should they arise Phillip Alexandria RN Navigator Cardiac Imaging [facility] [phone] office [phone] cell  Difficult IV start  Denies claustro 50mg  metoprolol tart BID

## 2020-09-13 ENCOUNTER — Ambulatory Visit (HOSPITAL_COMMUNITY)
Admission: RE | Admit: 2020-09-13 | Discharge: 2020-09-13 | Disposition: A | Discharge status: Home / Self Care | Payer: Medicare PPO | Source: Ambulatory Visit | Attending: Cardiology | Admitting: Cardiology

## 2020-09-13 ENCOUNTER — Other Ambulatory Visit: Payer: Self-pay

## 2020-09-13 ENCOUNTER — Telehealth: Payer: Self-pay

## 2020-09-13 DIAGNOSIS — R072 Precordial pain: Secondary | ICD-10-CM | POA: Insufficient documentation

## 2020-09-13 MED ORDER — NITROGLYCERIN 0.4 MG SL SUBL
SUBLINGUAL_TABLET | SUBLINGUAL | Status: AC
  Filled 2020-09-13: qty 2

## 2020-09-13 MED ORDER — IOHEXOL 300 MG/ML  SOLN
95.0000 mL | Freq: Once | INTRAMUSCULAR | Status: AC | PRN
  Administered 2020-09-13: 95 mL via INTRAVENOUS

## 2020-09-13 MED ORDER — NITROGLYCERIN 0.4 MG SL SUBL
0.8000 mg | SUBLINGUAL_TABLET | Freq: Once | SUBLINGUAL | Status: AC
  Administered 2020-09-13: 0.8 mg via SUBLINGUAL

## 2020-09-13 NOTE — Telephone Encounter (Signed)
-----   Message from Brian J Munley, MD sent at 09/13/2020  2:41 PM EDT ----- Cardiac CTA is very abnormal see if we get him in the office to see me this week or come in as I think you need further testing heart catheterization would like to talk to him about it. 

## 2020-09-13 NOTE — Telephone Encounter (Signed)
Left message on patients voicemail to please return our call.   

## 2020-09-14 ENCOUNTER — Telehealth: Payer: Self-pay

## 2020-09-14 NOTE — Telephone Encounter (Signed)
Spoke with patient regarding results and recommendation.  Patient verbalizes understanding and is agreeable to plan of care. Advised patient to call back with any issues or concerns.    Patient coming in on Friday to see Dr. Dulce Sellar

## 2020-09-14 NOTE — Telephone Encounter (Signed)
-----   Message from Baldo Daub, MD sent at 09/13/2020  2:41 PM EDT ----- Cardiac CTA is very abnormal see if we get him in the office to see me this week or come in as I think you need further testing heart catheterization would like to talk to him about it.

## 2020-09-16 ENCOUNTER — Other Ambulatory Visit: Payer: Self-pay

## 2020-09-16 ENCOUNTER — Encounter: Payer: Self-pay | Admitting: Cardiology

## 2020-09-16 ENCOUNTER — Ambulatory Visit: Payer: Medicare PPO | Admitting: Cardiology

## 2020-09-16 VITALS — BP 130/70 | HR 64 | Ht 72.0 in | Wt 243.0 lb

## 2020-09-16 DIAGNOSIS — E785 Hyperlipidemia, unspecified: Secondary | ICD-10-CM

## 2020-09-16 DIAGNOSIS — R931 Abnormal findings on diagnostic imaging of heart and coronary circulation: Secondary | ICD-10-CM | POA: Diagnosis not present

## 2020-09-16 DIAGNOSIS — I10 Essential (primary) hypertension: Secondary | ICD-10-CM

## 2020-09-16 DIAGNOSIS — J8410 Pulmonary fibrosis, unspecified: Secondary | ICD-10-CM | POA: Diagnosis not present

## 2020-09-16 DIAGNOSIS — I25118 Atherosclerotic heart disease of native coronary artery with other forms of angina pectoris: Secondary | ICD-10-CM

## 2020-09-16 MED ORDER — ROSUVASTATIN CALCIUM 20 MG PO TABS: 20.0000 mg | ORAL_TABLET | Freq: Every day | ORAL | 3 refills | Status: DC

## 2020-09-16 NOTE — H&P (View-Only) (Signed)
As Cardiology Office Note:    Date:  09/16/2020   ID:  Phillip Terry, DOB 1945/11/17, MRN [id number]  PCP:  Paulina Fusi, MD  Cardiologist:  Norman Herrlich, MD    Referring MD: Paulina Fusi, MD    ASSESSMENT:    1. Coronary artery disease of native artery of native heart with stable angina pectoris (HCC)   2. High coronary artery calcium score   3. Essential hypertension   4. Hyperlipidemia, unspecified hyperlipidemia type   5. Pulmonary fibrosis (HCC)    PLAN:    In order of problems listed above:  Is a very abnormal high risk cardiac CTA with calcium score exceeding 2000 and multivessel CAD he is having a pattern typical exertional as well as recumbent angina on appropriate medical therapy referred to coronary angiography and likely need revascularization. 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  Concerns regarding medicines are outlined above.  No orders of the defined types were placed in this encounter.  No orders of the defined types were placed in this encounter.   Chief Complaint  Patient presents with   Follow-up   Coronary Artery Disease    History of Present Illness:    Phillip Terry is a 75 y.o. male with a hx of idiopathic pulmonary fibrosis hypertension and hyperlipidemia last seen 08/22/2020 with complaints of exertional anginal discomfort substernal chest pain and pressure rating to both shoulders and arms.He was seen by me at [facility] during admission 08/26/2019.  His problem was progressive severe exertional shortness of breath proBNP level was quite low at 62 echo showed normal left and right ventricular function no evidence of elevated pressures or pulmonary artery hypertension underwent a myocardial  perfusion study showed an EF of 56% normal perfusion.  D-dimer was low.  CT of the chest showed pulmonary fibrosis and also showed coronary artery calcification.  He has been seen by pulmonary [facility] for his sleep apnea and pulmonary fibrosis and noncontrast CT of the chest September 2021 showed continue pulmonary fibrosis pattern of UIP with coronary and aortic atherosclerosis and hepatic steatosis.   Compliance with diet, lifestyle and medications: Yes  His daughter occupational therapist is here today to participate in evaluation decision making. He has had typical angina both with physical activity and occurs when he first goes to bed at night recumbent angina.  He is only needed to use nitroglycerin on 1 occasion. He is not on a statin and does not think he has intolerance and will start lipid-lowering therapy.  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 I reviewed the high risk markers of a very high calcium score greater than 2000 and multivessel CAD and advised him to undergo coronary angiography options risk and benefits detailed consent obtained and will be arranged for next week.  He obviously has no dye allergy.  He underwent cardiac CTA reported 09/13/2020 with a very high calcium score 2605 and multivessel coronary calcification and plaque unable to evaluate the lumen and degree of stenosis and recommended to undergo coronary angiography. Past Medical History:  Diagnosis Date   Calculus of kidney    Chest pain    DOE (dyspnea on exertion) 10/27/2019   Essential hypertension    Hyperlipidemia  OSA on CPAP 03/16/2007   RDI 39 Sat 89%   Peripheral neuropathy    Pulmonary fibrosis (HCC) 10/27/2019    Past Surgical History:  Procedure Laterality Date   CATARACT EXTRACTION Bilateral    FOOT SURGERY Left 2012   gastronemius slide procedure   LEG SURGERY Left 2012   Gastronemius slide procedure    TONSILLECTOMY      Current Medications: Current Meds  Medication Sig   albuterol (VENTOLIN HFA) 108 (90 Base) MCG/ACT inhaler Inhale 2 puffs into the lungs every 4 (four) hours as needed for shortness of breath.   amLODipine-valsartan (EXFORGE) 10-320 MG tablet Take 1 tablet by mouth at bedtime.   aspirin 81 MG chewable tablet Chew 81 mg by mouth daily.   febuxostat (ULORIC) 40 MG tablet Take 40 mg by mouth daily.   fenofibrate 160 MG tablet Take 160 mg by mouth daily.   metoprolol tartrate (LOPRESSOR) 50 MG tablet Take 50 mg by mouth in the morning and at bedtime.   omega-3 acid ethyl esters (LOVAZA) 1 g capsule TAKE 2 CAPSULES BY MOUTH TWICE DAILY     Allergies:   Patient has no known allergies.   Social History   Socioeconomic History   Marital status: Married    Spouse name: Not on file   Number of children: Not on file   Years of education: Not on file   Highest education level: Not on file  Occupational History   Not on file  Tobacco Use   Smoking status: Never   Smokeless tobacco: Never  Vaping Use   Vaping Use: Never used  Substance and Sexual Activity   Alcohol use: Never   Drug use: Never   Sexual activity: Not on file  Other Topics Concern   Not on file  Social History Narrative   Not on file   Social Determinants of Health   Financial Resource Strain: Not on file  Food Insecurity: Not on file  Transportation Needs: Not on file  Physical Activity: Not on file  Stress: Not on file  Social Connections: Not on file     Family History: The patient's family history includes Breast cancer in his mother; CAD in his father; Fibromyalgia in his sister. ROS:   Please see the history of present illness.    All other systems reviewed and are negative.  EKGs/Labs/Other Studies Reviewed:    The following studies were reviewed today:   Recent Labs: 12/21/2019: ALT 18 08/22/2020: BUN 22; Creatinine, Ser 1.14; Potassium 4.5; Sodium 134  Recent Lipid Panel     Component Value Date/Time   CHOL 155 02/26/2020 0808   TRIG 143 02/26/2020 0808   HDL 39 (L) 02/26/2020 0808   CHOLHDL 4.0 02/26/2020 0808   LDLCALC 91 02/26/2020 0808    Physical Exam:    VS:  BP 130/70 (BP Location: Right Arm, Patient Position: Sitting, Cuff Size: Normal)   Pulse 64   Ht 6' (1.829 m)   Wt 243 lb (110.2 kg)   SpO2 95%   BMI 32.96 kg/m     Wt Readings from Last 3 Encounters:  09/16/20 243 lb (110.2 kg)  08/22/20 246 lb 6.4 oz (111.8 kg)  07/25/20 256 lb (116.1 kg)     GEN:  Well nourished, well developed in no acute distress HEENT: Normal NECK: No JVD; No carotid bruits LYMPHATICS: No lymphadenopathy CARDIAC: RRR, no murmurs, rubs, gallops RESPIRATORY:  Clear to auscultation without rales, wheezing or rhonchi  ABDOMEN: Soft, non-tender, non-distended MUSCULOSKELETAL:    No edema; No deformity  SKIN: Warm and dry NEUROLOGIC:  Alert and oriented x 3 PSYCHIATRIC:  Normal affect    Signed, Norman Herrlich, MD  09/16/2020 2:32 PM    [facility]

## 2020-09-16 NOTE — Patient Instructions (Signed)
Medication Instructions:  Your physician has recommended you make the following change in your medication:  START: Rosuvastatin 20 mg take one tablet by mouth daily.  *If you need a refill on your cardiac medications before your next appointment, please call your pharmacy*   Lab Work: Your physician recommends that you return for lab work in: TODAY BMP, CBC If you have labs (blood work) drawn today and your tests are completely normal, you will receive your results only by: MyChart Message (if you have MyChart) OR A paper copy in the mail If you have any lab test that is abnormal or we need to change your treatment, we will call you to review the results.   Testing/Procedures: [facility]ARDIOVASC[facility] DIVISION [facility] AT [place] [address] [place] [address] Dept: [phone] Loc: [phone]  Phillip Terry  09/16/2020  You are scheduled for a Cardiac Catheterization on Friday, August 12 with Dr. Lance Muss.  1. Please arrive at the [facility] (Main Entrance A) at [facility]: [address] [place], [place] [address] at 5:30 AM (This time is two hours before your procedure to ensure your preparation). Free valet parking service is available.   Special note: Every effort is made to have your procedure done on time. Please understand that emergencies sometimes delay scheduled procedures.  2. Diet: Do not eat solid foods after midnight.  The patient may have clear liquids until 5am upon the day of the procedure.  3. Labs: YOU HAD YOUR LABS DRAWN TODAY 09/16/2020  4. Medication instructions in preparation for your procedure:   Contrast Allergy: No  On the morning of your procedure, take your Aspirin and any morning medicines NOT listed above.  You may use sips of water.  5. Plan for one night stay--bring personal belongings. 6. Bring a current list of your medications and current insurance cards. 7. You MUST have a  responsible person to drive you home. 8. Someone MUST be with you the first 24 hours after you arrive home or your discharge will be delayed. 9. Please wear clothes that are easy to get on and off and wear slip-on shoes.  Thank you for allowing [place] to care for you!   -- [name]    Follow-Up: At [facility], you and your health needs are our priority.  As part of our continuing mission to provide you with exceptional heart care, we have created designated Provider Care Teams.  These Care Teams include your primary Cardiologist (physician) and Advanced Practice Providers (APPs -  Physician Assistants and Nurse Practitioners) who all work together to provide you with the care you need, when you need it.  We recommend signing up for the patient portal called "MyChart".  Sign up information is provided on this After Visit Summary.  MyChart is used to connect with patients for Virtual Visits (Telemedicine).  Patients are able to view lab/test results, encounter notes, upcoming appointments, etc.  Non-urgent messages can be sent to your provider as well.   To learn more about what you can do with MyChart, go to [email or web address].    Your next appointment:   8 week(s)  The format for your next appointment:   In Person  Provider:   Norman Herrlich, MD   Other Instructions

## 2020-09-16 NOTE — Addendum Note (Signed)
Addended by: Delorse Limber I on: 09/16/2020 02:50 PM   Modules accepted: Orders

## 2020-09-16 NOTE — Progress Notes (Signed)
As Cardiology Office Note:    Date:  09/16/2020   ID:  Phillip Terry, DOB 1945/11/17, MRN [id number]  PCP:  Paulina Fusi, MD  Cardiologist:  Norman Herrlich, MD    Referring MD: Paulina Fusi, MD    ASSESSMENT:    1. Coronary artery disease of native artery of native heart with stable angina pectoris (HCC)   2. High coronary artery calcium score   3. Essential hypertension   4. Hyperlipidemia, unspecified hyperlipidemia type   5. Pulmonary fibrosis (HCC)    PLAN:    In order of problems listed above:  Is a very abnormal high risk cardiac CTA with calcium score exceeding 2000 and multivessel CAD he is having a pattern typical exertional as well as recumbent angina on appropriate medical therapy referred to coronary angiography and likely need revascularization. 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  Concerns regarding medicines are outlined above.  No orders of the defined types were placed in this encounter.  No orders of the defined types were placed in this encounter.   Chief Complaint  Patient presents with   Follow-up   Coronary Artery Disease    History of Present Illness:    Phillip Terry is a 75 y.o. male with a hx of idiopathic pulmonary fibrosis hypertension and hyperlipidemia last seen 08/22/2020 with complaints of exertional anginal discomfort substernal chest pain and pressure rating to both shoulders and arms.He was seen by me at [facility] during admission 08/26/2019.  His problem was progressive severe exertional shortness of breath proBNP level was quite low at 62 echo showed normal left and right ventricular function no evidence of elevated pressures or pulmonary artery hypertension underwent a myocardial  perfusion study showed an EF of 56% normal perfusion.  D-dimer was low.  CT of the chest showed pulmonary fibrosis and also showed coronary artery calcification.  He has been seen by pulmonary [facility] for his sleep apnea and pulmonary fibrosis and noncontrast CT of the chest September 2021 showed continue pulmonary fibrosis pattern of UIP with coronary and aortic atherosclerosis and hepatic steatosis.   Compliance with diet, lifestyle and medications: Yes  His daughter occupational therapist is here today to participate in evaluation decision making. He has had typical angina both with physical activity and occurs when he first goes to bed at night recumbent angina.  He is only needed to use nitroglycerin on 1 occasion. He is not on a statin and does not think he has intolerance and will start lipid-lowering therapy.  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 I reviewed the high risk markers of a very high calcium score greater than 2000 and multivessel CAD and advised him to undergo coronary angiography options risk and benefits detailed consent obtained and will be arranged for next week.  He obviously has no dye allergy.  He underwent cardiac CTA reported 09/13/2020 with a very high calcium score 2605 and multivessel coronary calcification and plaque unable to evaluate the lumen and degree of stenosis and recommended to undergo coronary angiography. Past Medical History:  Diagnosis Date   Calculus of kidney    Chest pain    DOE (dyspnea on exertion) 10/27/2019   Essential hypertension    Hyperlipidemia  OSA on CPAP 03/16/2007   RDI 39 Sat 89%   Peripheral neuropathy    Pulmonary fibrosis (HCC) 10/27/2019    Past Surgical History:  Procedure Laterality Date   CATARACT EXTRACTION Bilateral    FOOT SURGERY Left 2012   gastronemius slide procedure   LEG SURGERY Left 2012   Gastronemius slide procedure    TONSILLECTOMY      Current Medications: Current Meds  Medication Sig   albuterol (VENTOLIN HFA) 108 (90 Base) MCG/ACT inhaler Inhale 2 puffs into the lungs every 4 (four) hours as needed for shortness of breath.   amLODipine-valsartan (EXFORGE) 10-320 MG tablet Take 1 tablet by mouth at bedtime.   aspirin 81 MG chewable tablet Chew 81 mg by mouth daily.   febuxostat (ULORIC) 40 MG tablet Take 40 mg by mouth daily.   fenofibrate 160 MG tablet Take 160 mg by mouth daily.   metoprolol tartrate (LOPRESSOR) 50 MG tablet Take 50 mg by mouth in the morning and at bedtime.   omega-3 acid ethyl esters (LOVAZA) 1 g capsule TAKE 2 CAPSULES BY MOUTH TWICE DAILY     Allergies:   Patient has no known allergies.   Social History   Socioeconomic History   Marital status: Married    Spouse name: Not on file   Number of children: Not on file   Years of education: Not on file   Highest education level: Not on file  Occupational History   Not on file  Tobacco Use   Smoking status: Never   Smokeless tobacco: Never  Vaping Use   Vaping Use: Never used  Substance and Sexual Activity   Alcohol use: Never   Drug use: Never   Sexual activity: Not on file  Other Topics Concern   Not on file  Social History Narrative   Not on file   Social Determinants of Health   Financial Resource Strain: Not on file  Food Insecurity: Not on file  Transportation Needs: Not on file  Physical Activity: Not on file  Stress: Not on file  Social Connections: Not on file     Family History: The patient's family history includes Breast cancer in his mother; CAD in his father; Fibromyalgia in his sister. ROS:   Please see the history of present illness.    All other systems reviewed and are negative.  EKGs/Labs/Other Studies Reviewed:    The following studies were reviewed today:   Recent Labs: 12/21/2019: ALT 18 08/22/2020: BUN 22; Creatinine, Ser 1.14; Potassium 4.5; Sodium 134  Recent Lipid Panel     Component Value Date/Time   CHOL 155 02/26/2020 0808   TRIG 143 02/26/2020 0808   HDL 39 (L) 02/26/2020 0808   CHOLHDL 4.0 02/26/2020 0808   LDLCALC 91 02/26/2020 0808    Physical Exam:    VS:  BP 130/70 (BP Location: Right Arm, Patient Position: Sitting, Cuff Size: Normal)   Pulse 64   Ht 6' (1.829 m)   Wt 243 lb (110.2 kg)   SpO2 95%   BMI 32.96 kg/m     Wt Readings from Last 3 Encounters:  09/16/20 243 lb (110.2 kg)  08/22/20 246 lb 6.4 oz (111.8 kg)  07/25/20 256 lb (116.1 kg)     GEN:  Well nourished, well developed in no acute distress HEENT: Normal NECK: No JVD; No carotid bruits LYMPHATICS: No lymphadenopathy CARDIAC: RRR, no murmurs, rubs, gallops RESPIRATORY:  Clear to auscultation without rales, wheezing or rhonchi  ABDOMEN: Soft, non-tender, non-distended MUSCULOSKELETAL:  No edema; No deformity  SKIN: Warm and dry NEUROLOGIC:  Alert and oriented x 3 PSYCHIATRIC:  Normal affect    Signed, Norman Herrlich, MD  09/16/2020 2:32 PM    [facility]

## 2020-09-17 LAB — BASIC METABOLIC PANEL
BUN/Creatinine Ratio: 22 (ref 10–24)
BUN: 26 mg/dL (ref 8–27)
CO2: 19 mmol/L — ABNORMAL LOW (ref 20–29)
Calcium: 9.8 mg/dL (ref 8.6–10.2)
Chloride: 105 mmol/L (ref 96–106)
Creatinine, Ser: 1.18 mg/dL (ref 0.76–1.27)
Glucose: 105 mg/dL — ABNORMAL HIGH (ref 65–99)
Potassium: 4.4 mmol/L (ref 3.5–5.2)
Sodium: 140 mmol/L (ref 134–144)
eGFR: 65 mL/min/{1.73_m2} (ref >59)

## 2020-09-17 LAB — CBC
Hematocrit: 47.8 % (ref 37.5–51.0)
Hemoglobin: 16.2 g/dL (ref 13.0–17.7)
MCH: 29.9 pg (ref 26.6–33.0)
MCHC: 33.9 g/dL (ref 31.5–35.7)
MCV: 88 fL (ref 79–97)
Platelets: 247 10*3/uL (ref 150–450)
RBC: 5.42 x10E6/uL (ref 4.14–5.80)
RDW: 13.6 % (ref 11.6–15.4)
WBC: 7.8 10*3/uL (ref 3.4–10.8)

## 2020-09-19 ENCOUNTER — Telehealth: Payer: Self-pay

## 2020-09-19 NOTE — Telephone Encounter (Signed)
-----   Message from Baldo Daub, MD sent at 09/18/2020 12:50 PM EDT ----- Normal or stable result  Good results he is scheduled for coronary angiography

## 2020-09-19 NOTE — Telephone Encounter (Signed)
Spoke with patient regarding results and recommendation.  Patient verbalizes understanding and is agreeable to plan of care. Advised patient to call back with any issues or concerns.  

## 2020-09-22 ENCOUNTER — Telehealth: Payer: Self-pay | Admitting: *Deleted

## 2020-09-22 NOTE — Telephone Encounter (Signed)
Cardiac catheterization scheduled at [facility] for: Friday September 23, 2020 7:30 AM [facility] Main Entrance A [facility]) at: 5:30 AM   No solid food after midnight prior to cath, clear liquids until 5 AM day of procedure.   AM medications can be taken pre-cath with sips of water including aspirin 81 mg.    Confirmed patient has responsible adult to drive home post procedure and be with patient first 24 hours after arriving home.  Patients are allowed one visitor in the waiting room during the time they are at the hospital for their procedure. Both patient and visitor must wear a mask once they enter the hospital.   Patient reports does not currently have any symptoms concerning for COVID-19 and no household members with COVID-19 like illness.      Reviewed procedure/mask/visitor instructions with patient.

## 2020-09-23 ENCOUNTER — Other Ambulatory Visit: Payer: Self-pay

## 2020-09-23 ENCOUNTER — Encounter (HOSPITAL_COMMUNITY)
Admission: RE | Disposition: A | Discharge status: Home / Self Care | Payer: Self-pay | Source: Home / Self Care | Attending: Interventional Cardiology

## 2020-09-23 ENCOUNTER — Encounter (HOSPITAL_COMMUNITY): Payer: Self-pay | Admitting: Interventional Cardiology

## 2020-09-23 ENCOUNTER — Other Ambulatory Visit: Payer: Self-pay | Admitting: Home Health

## 2020-09-23 ENCOUNTER — Ambulatory Visit (HOSPITAL_COMMUNITY)
Admission: RE | Admit: 2020-09-23 | Discharge: 2020-09-23 | Disposition: A | Discharge status: Home / Self Care | Payer: Medicare PPO | Attending: Interventional Cardiology | Admitting: Interventional Cardiology

## 2020-09-23 DIAGNOSIS — R079 Chest pain, unspecified: Secondary | ICD-10-CM

## 2020-09-23 DIAGNOSIS — Z79899 Other long term (current) drug therapy: Secondary | ICD-10-CM | POA: Insufficient documentation

## 2020-09-23 DIAGNOSIS — J84112 Idiopathic pulmonary fibrosis: Secondary | ICD-10-CM | POA: Insufficient documentation

## 2020-09-23 DIAGNOSIS — E785 Hyperlipidemia, unspecified: Secondary | ICD-10-CM | POA: Diagnosis not present

## 2020-09-23 DIAGNOSIS — I25118 Atherosclerotic heart disease of native coronary artery with other forms of angina pectoris: Secondary | ICD-10-CM | POA: Insufficient documentation

## 2020-09-23 DIAGNOSIS — Z7982 Long term (current) use of aspirin: Secondary | ICD-10-CM | POA: Insufficient documentation

## 2020-09-23 DIAGNOSIS — I10 Essential (primary) hypertension: Secondary | ICD-10-CM | POA: Insufficient documentation

## 2020-09-23 DIAGNOSIS — I2510 Atherosclerotic heart disease of native coronary artery without angina pectoris: Secondary | ICD-10-CM

## 2020-09-23 HISTORY — PX: LEFT HEART CATH AND CORONARY ANGIOGRAPHY: CATH118249

## 2020-09-23 SURGERY — LEFT HEART CATH AND CORONARY ANGIOGRAPHY
Anesthesia: LOCAL

## 2020-09-23 MED ORDER — LIDOCAINE HCL (PF) 1 % IJ SOLN
INTRAMUSCULAR | Status: AC
  Filled 2020-09-23: qty 30

## 2020-09-23 MED ORDER — MIDAZOLAM HCL 2 MG/2ML IJ SOLN
INTRAMUSCULAR | Status: AC
  Filled 2020-09-23: qty 2

## 2020-09-23 MED ORDER — SODIUM CHLORIDE 0.9 % IV SOLN: INTRAVENOUS | Status: DC

## 2020-09-23 MED ORDER — HEPARIN SODIUM (PORCINE) 1000 UNIT/ML IJ SOLN
INTRAMUSCULAR | Status: DC | PRN
  Administered 2020-09-23: 5500 [IU] via INTRAVENOUS

## 2020-09-23 MED ORDER — ROSUVASTATIN CALCIUM 20 MG PO TABS: 20.0000 mg | ORAL_TABLET | Freq: Every day | ORAL | Status: DC

## 2020-09-23 MED ORDER — SODIUM CHLORIDE 0.9 % IV SOLN: 250.0000 mL | INTRAVENOUS | Status: DC | PRN

## 2020-09-23 MED ORDER — SODIUM CHLORIDE 0.9% FLUSH: 3.0000 mL | INTRAVENOUS | Status: DC | PRN

## 2020-09-23 MED ORDER — LIDOCAINE HCL (PF) 1 % IJ SOLN
INTRAMUSCULAR | Status: DC | PRN
  Administered 2020-09-23: 2 mL

## 2020-09-23 MED ORDER — LABETALOL HCL 5 MG/ML IV SOLN: 10.0000 mg | INTRAVENOUS | Status: DC | PRN

## 2020-09-23 MED ORDER — HYDRALAZINE HCL 20 MG/ML IJ SOLN: 10.0000 mg | INTRAMUSCULAR | Status: DC | PRN

## 2020-09-23 MED ORDER — ASPIRIN 81 MG PO CHEW: 81.0000 mg | CHEWABLE_TABLET | ORAL | Status: DC

## 2020-09-23 MED ORDER — FENTANYL CITRATE (PF) 100 MCG/2ML IJ SOLN
INTRAMUSCULAR | Status: AC
  Filled 2020-09-23: qty 2

## 2020-09-23 MED ORDER — HEPARIN SODIUM (PORCINE) 1000 UNIT/ML IJ SOLN
INTRAMUSCULAR | Status: AC
  Filled 2020-09-23: qty 1

## 2020-09-23 MED ORDER — IOHEXOL 350 MG/ML SOLN
INTRAVENOUS | Status: DC | PRN
  Administered 2020-09-23: 75 mL

## 2020-09-23 MED ORDER — ACETAMINOPHEN 325 MG PO TABS: 650.0000 mg | ORAL_TABLET | ORAL | Status: DC | PRN

## 2020-09-23 MED ORDER — SODIUM CHLORIDE 0.9 % WEIGHT BASED INFUSION
3.0000 mL/kg/h | INTRAVENOUS | Status: AC
  Administered 2020-09-23: 3 mL/kg/h via INTRAVENOUS

## 2020-09-23 MED ORDER — VERAPAMIL HCL 2.5 MG/ML IV SOLN
INTRAVENOUS | Status: DC | PRN
  Administered 2020-09-23: 10 mL via INTRA_ARTERIAL

## 2020-09-23 MED ORDER — ONDANSETRON HCL 4 MG/2ML IJ SOLN: 4.0000 mg | Freq: Four times a day (QID) | INTRAMUSCULAR | Status: DC | PRN

## 2020-09-23 MED ORDER — HEPARIN (PORCINE) IN NACL 1000-0.9 UT/500ML-% IV SOLN
INTRAVENOUS | Status: DC | PRN
  Administered 2020-09-23 (×2): 500 mL

## 2020-09-23 MED ORDER — SODIUM CHLORIDE 0.9% FLUSH: 3.0000 mL | Freq: Two times a day (BID) | INTRAVENOUS | Status: DC

## 2020-09-23 MED ORDER — FENTANYL CITRATE (PF) 100 MCG/2ML IJ SOLN
INTRAMUSCULAR | Status: DC | PRN
  Administered 2020-09-23: 25 ug via INTRAVENOUS

## 2020-09-23 MED ORDER — MIDAZOLAM HCL 2 MG/2ML IJ SOLN
INTRAMUSCULAR | Status: DC | PRN
  Administered 2020-09-23: 2 mg via INTRAVENOUS

## 2020-09-23 MED ORDER — SODIUM CHLORIDE 0.9 % WEIGHT BASED INFUSION: 1.0000 mL/kg/h | INTRAVENOUS | Status: DC

## 2020-09-23 MED ORDER — VERAPAMIL HCL 2.5 MG/ML IV SOLN
INTRAVENOUS | Status: AC
  Filled 2020-09-23: qty 2

## 2020-09-23 SURGICAL SUPPLY — 10 items
CATH 5FR JL3.5 JR4 ANG PIG MP (CATHETERS) ×2 IMPLANT
CATH INFINITI 5FR AL1 (CATHETERS) ×2 IMPLANT
DEVICE RAD COMP TR BAND LRG (VASCULAR PRODUCTS) ×2 IMPLANT
GLIDESHEATH SLEND SS 6F .021 (SHEATH) ×2 IMPLANT
GUIDEWIRE INQWIRE 1.5J.035X260 (WIRE) ×1 IMPLANT
INQWIRE 1.5J .035X260CM (WIRE) ×2
KIT HEART LEFT (KITS) ×2 IMPLANT
PACK CARDIAC CATHETERIZATION (CUSTOM PROCEDURE TRAY) ×2 IMPLANT
TRANSDUCER W/STOPCOCK (MISCELLANEOUS) ×2 IMPLANT
TUBING CIL FLEX 10 FLL-RA (TUBING) ×2 IMPLANT

## 2020-09-23 NOTE — Research (Signed)
IDENTIFY Informed Consent   Subject Name: Phillip Terry  Subject met inclusion and exclusion criteria.  The informed consent form, study requirements and expectations were reviewed with the subject and questions and concerns were addressed prior to the signing of the consent form.  The subject verbalized understanding of the trail requirements.  The subject agreed to participate in the IDENTIFY trial and signed the informed consent.  The informed consent was obtained prior to performance of any protocol-specific procedures for the subject.  A copy of the signed informed consent was given to the subject and a copy was placed in the subject's medical record.  Mena Goes. 09/23/2020, 06:45 am

## 2020-09-23 NOTE — Progress Notes (Signed)
Echo ordered, follow up with Dr Dulce Sellar.

## 2020-09-23 NOTE — Progress Notes (Signed)
[facility] was consulted for CABG evaluation with Dr. Cliffton Asters. [facility] office will call the patient with an appointment.

## 2020-09-23 NOTE — Interval H&P Note (Signed)
Cath Lab Visit (complete for each Cath Lab visit)  Clinical Evaluation Leading to the Procedure:   ACS: No.  Non-ACS:    Anginal Classification: CCS III  Anti-ischemic medical therapy: Minimal Therapy (1 class of medications)  Non-Invasive Test Results: No non-invasive testing performed  Prior CABG: No previous CABG      History and Physical Interval Note:  09/23/2020 7:38 AM  Phillip Terry  has presented today for surgery, with the diagnosis of CAD, chest pain.  The various methods of treatment have been discussed with the patient and family. After consideration of risks, benefits and other options for treatment, the patient has consented to  Procedure(s): LEFT HEART CATH AND CORONARY ANGIOGRAPHY (N/A) as a surgical intervention.  The patient's history has been reviewed, patient examined, no change in status, stable for surgery.  I have reviewed the patient's chart and labs.  Questions were answered to the patient's satisfaction.     Lance Muss

## 2020-09-29 ENCOUNTER — Institutional Professional Consult (permissible substitution): Payer: Medicare PPO | Admitting: Thoracic Surgery (Cardiothoracic Vascular Surgery)

## 2020-09-29 ENCOUNTER — Other Ambulatory Visit: Payer: Self-pay

## 2020-09-29 ENCOUNTER — Encounter: Payer: Self-pay | Admitting: Thoracic Surgery (Cardiothoracic Vascular Surgery)

## 2020-09-29 VITALS — BP 149/80 | HR 72 | Resp 20 | Ht 72.0 in | Wt 245.0 lb

## 2020-09-29 DIAGNOSIS — R0782 Intercostal pain: Secondary | ICD-10-CM

## 2020-09-29 DIAGNOSIS — I25119 Atherosclerotic heart disease of native coronary artery with unspecified angina pectoris: Secondary | ICD-10-CM

## 2020-09-29 DIAGNOSIS — I2510 Atherosclerotic heart disease of native coronary artery without angina pectoris: Secondary | ICD-10-CM | POA: Diagnosis not present

## 2020-09-29 NOTE — Progress Notes (Signed)
[address]       [place] [address]             [phone]        Phillip Terry [facility] Medical Record #[id number] Date of Birth: May 10, 1945  Referring: Corky Crafts, MD Primary Care: Paulina Fusi, MD Primary Cardiologist:None  Chief Complaint:    Chief Complaint  Patient presents with   Coronary Artery Disease    Surgical consult, Cardiac Cath and ECHO 09/23/20    History of Present Illness:     75 year old male presents for surgical evaluation of two-vessel coronary artery disease.  He began having symptoms of shortness of breath over a year ago.  He was initially diagnosed with pulmonary fibrosis but after being placed on inhaler his symptoms persisted.  He subsequently underwent a coronary CT which was concerning for coronary disease.  He underwent a left heart cath which showed severe LAD disease, and moderate's RCA disease.  In regards to her symptoms she does endorse occasional chest pain.  The most recent was last night but this alleviated without requiring any medication.  He also has some shortness of breath as well.   Past Medical and Surgical History: Previous Chest Surgery: No Previous Chest Radiation: No Diabetes Mellitus: No.  HbA1C pending Creatinine: 1.18  Past Medical History:  Diagnosis Date   Calculus of kidney    Chest pain    DOE (dyspnea on exertion) 10/27/2019   Essential hypertension    Hyperlipidemia    OSA on CPAP 03/16/2007   RDI 39 Sat 89%   Peripheral neuropathy    Pulmonary fibrosis (HCC) 10/27/2019    Past Surgical History:  Procedure Laterality Date   CATARACT EXTRACTION Bilateral    FOOT SURGERY Left 2012   gastronemius slide procedure   LEFT HEART CATH AND CORONARY ANGIOGRAPHY N/A 09/23/2020   Procedure: LEFT HEART CATH AND CORONARY ANGIOGRAPHY;  Surgeon: Corky Crafts, MD;  Location: [facility] INVAS[facility];  Service: Cardiovascular;  Laterality: N/A;   LEG SURGERY Left 2012    Gastronemius slide procedure   TONSILLECTOMY      Social History: Support: Strong family support.  He presents with his wife and 2 daughters.  Social History   Tobacco Use  Smoking Status Never  Smokeless Tobacco Never    Social History   Substance and Sexual Activity  Alcohol Use Never     No Known Allergies  Medications: Asprin: Yes Statin: Yes Beta Blocker: Yes Ace Inhibitor: No Anti-Coagulation: No  Current Outpatient Medications  Medication Sig Dispense Refill   albuterol (VENTOLIN HFA) 108 (90 Base) MCG/ACT inhaler Inhale 2 puffs into the lungs every 4 (four) hours as needed for shortness of breath.     amLODipine-valsartan (EXFORGE) 10-320 MG tablet Take 1 tablet by mouth at bedtime.     aspirin 81 MG chewable tablet Chew 81 mg by mouth at bedtime.     febuxostat (ULORIC) 40 MG tablet Take 40 mg by mouth at bedtime.     fenofibrate 160 MG tablet Take 160 mg by mouth at bedtime.     metoprolol tartrate (LOPRESSOR) 50 MG tablet Take 50 mg by mouth in the morning and at bedtime.     nitroGLYCERIN (NITROSTAT) 0.4 MG SL tablet Place 0.4 mg under the tongue every 5 (five) minutes x 3 doses as needed for chest pain.     omega-3 acid ethyl esters (LOVAZA) 1 g capsule TAKE 2 CAPSULES BY MOUTH  TWICE DAILY 360 capsule 3   rosuvastatin (CRESTOR) 20 MG tablet Take 1 tablet (20 mg total) by mouth at bedtime.     No current facility-administered medications for this visit.    (Not in a hospital admission)   Family History  Problem Relation Age of Onset   Breast cancer Mother    CAD Father    Fibromyalgia Sister      Review of Systems:   Review of Systems  Constitutional:  Positive for malaise/fatigue.  Respiratory:  Positive for shortness of breath.   Cardiovascular:  Positive for chest pain. Negative for palpitations and leg swelling.  Musculoskeletal:  Positive for joint pain and myalgias.  Neurological: Negative.      Physical Exam: BP (!) 149/80   Pulse  72   Resp 20   Ht 6' (1.829 m)   Wt 245 lb (111.1 kg)   SpO2 93% Comment: RA  BMI 33.23 kg/m  Physical Exam Constitutional:      General: He is not in acute distress.    Appearance: Normal appearance. He is not ill-appearing.  HENT:     Head: Normocephalic and atraumatic.  Eyes:     Extraocular Movements: Extraocular movements intact.  Cardiovascular:     Rate and Rhythm: Normal rate.  Pulmonary:     Effort: Pulmonary effort is normal.  Abdominal:     General: There is no distension.  Musculoskeletal:        General: Normal range of motion.  Skin:    General: Skin is warm and dry.  Neurological:     General: No focal deficit present.     Mental Status: He is alert and oriented to person, place, and time.      Diagnostic Studies & Laboratory data:    Left Heart Catherization: Left heart cath shows significant coronary disease.  The LAD appears to be a good target distally.  He also has some moderate right coronary disease, as well as some diagonal disease. Echo: Pending   I have independently reviewed the above radiologic studies and discussed with the patient   Recent Lab Findings: Lab Results  Component Value Date   WBC 7.8 09/16/2020   HGB 16.2 09/16/2020   HCT 47.8 09/16/2020   PLT 247 09/16/2020   GLUCOSE 105 (H) 09/16/2020   CHOL 155 02/26/2020   TRIG 143 02/26/2020   HDL 39 (L) 02/26/2020   LDLCALC 91 02/26/2020   ALT 18 12/21/2019   AST 20 12/21/2019   NA 140 09/16/2020   K 4.4 09/16/2020   CL 105 09/16/2020   CREATININE 1.18 09/16/2020   BUN 26 09/16/2020   CO2 19 (L) 09/16/2020      Assessment / Plan:   75 year old male with two-vessel coronary disease.  RCA disease is moderate, but the LAD disease is quite significant.  I personally reviewed the cath and discussed it with Dr. Eldridge Dace.  We both think that he would be a good candidate for robotic assisted MIDCAB, and potential PCI treatment to RCA and diagonal.  On review of his CT scan he  does have a large heart, but his chest wall is thin.  Most of his obesity is abdominal.  He does carry the history of pulmonary fibrosis, thus I will obtain a dedicated CT chest for further evaluation along with pulmonary function testing.  I will also obtain new echocardiogram.  The report from the echocardiogram in July 2021 did not show any significant valvular disease.  He would like to  proceed with robotic assisted MIDCAB on October 24, 2020.  I instructed him to return to the emergency department if he has any worsening or progression in symptoms.     I  spent 40 minutes counseling the patient face to face.   Corliss Skains 09/29/2020 3:53 PM

## 2020-10-03 ENCOUNTER — Other Ambulatory Visit: Payer: Self-pay

## 2020-10-03 DIAGNOSIS — I25119 Atherosclerotic heart disease of native coronary artery with unspecified angina pectoris: Secondary | ICD-10-CM

## 2020-10-04 ENCOUNTER — Ambulatory Visit: Payer: Medicare PPO | Admitting: Cardiology

## 2020-10-07 ENCOUNTER — Other Ambulatory Visit (HOSPITAL_COMMUNITY): Payer: Medicare PPO

## 2020-10-07 ENCOUNTER — Inpatient Hospital Stay (HOSPITAL_COMMUNITY)
Admission: EM | Admit: 2020-10-07 | Discharge: 2020-10-15 | DRG: 236 | Disposition: A | Discharge status: Home / Self Care | Payer: Medicare PPO | Attending: Thoracic Surgery (Cardiothoracic Vascular Surgery) | Admitting: Thoracic Surgery (Cardiothoracic Vascular Surgery)

## 2020-10-07 ENCOUNTER — Other Ambulatory Visit: Payer: Self-pay

## 2020-10-07 ENCOUNTER — Emergency Department (HOSPITAL_COMMUNITY): Payer: Medicare PPO

## 2020-10-07 ENCOUNTER — Encounter (HOSPITAL_COMMUNITY): Payer: Self-pay

## 2020-10-07 ENCOUNTER — Inpatient Hospital Stay (HOSPITAL_COMMUNITY): Payer: Medicare PPO

## 2020-10-07 DIAGNOSIS — Z4682 Encounter for fitting and adjustment of non-vascular catheter: Secondary | ICD-10-CM | POA: Diagnosis not present

## 2020-10-07 DIAGNOSIS — R0789 Other chest pain: Secondary | ICD-10-CM | POA: Diagnosis not present

## 2020-10-07 DIAGNOSIS — I25810 Atherosclerosis of coronary artery bypass graft(s) without angina pectoris: Secondary | ICD-10-CM | POA: Diagnosis not present

## 2020-10-07 DIAGNOSIS — I517 Cardiomegaly: Secondary | ICD-10-CM | POA: Diagnosis not present

## 2020-10-07 DIAGNOSIS — R739 Hyperglycemia, unspecified: Secondary | ICD-10-CM

## 2020-10-07 DIAGNOSIS — Z9989 Dependence on other enabling machines and devices: Secondary | ICD-10-CM | POA: Diagnosis not present

## 2020-10-07 DIAGNOSIS — Z79899 Other long term (current) drug therapy: Secondary | ICD-10-CM | POA: Diagnosis not present

## 2020-10-07 DIAGNOSIS — Z20822 Contact with and (suspected) exposure to covid-19: Secondary | ICD-10-CM | POA: Diagnosis present

## 2020-10-07 DIAGNOSIS — Z9842 Cataract extraction status, left eye: Secondary | ICD-10-CM | POA: Diagnosis not present

## 2020-10-07 DIAGNOSIS — Z7982 Long term (current) use of aspirin: Secondary | ICD-10-CM

## 2020-10-07 DIAGNOSIS — I200 Unstable angina: Secondary | ICD-10-CM | POA: Diagnosis not present

## 2020-10-07 DIAGNOSIS — I97190 Other postprocedural cardiac functional disturbances following cardiac surgery: Secondary | ICD-10-CM | POA: Diagnosis not present

## 2020-10-07 DIAGNOSIS — R0902 Hypoxemia: Secondary | ICD-10-CM | POA: Diagnosis not present

## 2020-10-07 DIAGNOSIS — J8410 Pulmonary fibrosis, unspecified: Secondary | ICD-10-CM | POA: Diagnosis present

## 2020-10-07 DIAGNOSIS — D62 Acute posthemorrhagic anemia: Secondary | ICD-10-CM | POA: Diagnosis not present

## 2020-10-07 DIAGNOSIS — Z951 Presence of aortocoronary bypass graft: Secondary | ICD-10-CM | POA: Diagnosis not present

## 2020-10-07 DIAGNOSIS — Z8249 Family history of ischemic heart disease and other diseases of the circulatory system: Secondary | ICD-10-CM | POA: Diagnosis not present

## 2020-10-07 DIAGNOSIS — Z6833 Body mass index (BMI) 33.0-33.9, adult: Secondary | ICD-10-CM | POA: Diagnosis not present

## 2020-10-07 DIAGNOSIS — E785 Hyperlipidemia, unspecified: Secondary | ICD-10-CM | POA: Diagnosis present

## 2020-10-07 DIAGNOSIS — I10 Essential (primary) hypertension: Secondary | ICD-10-CM | POA: Diagnosis present

## 2020-10-07 DIAGNOSIS — J9811 Atelectasis: Secondary | ICD-10-CM | POA: Diagnosis present

## 2020-10-07 DIAGNOSIS — R079 Chest pain, unspecified: Secondary | ICD-10-CM | POA: Diagnosis not present

## 2020-10-07 DIAGNOSIS — Z9841 Cataract extraction status, right eye: Secondary | ICD-10-CM | POA: Diagnosis not present

## 2020-10-07 DIAGNOSIS — I25110 Atherosclerotic heart disease of native coronary artery with unstable angina pectoris: Secondary | ICD-10-CM | POA: Diagnosis not present

## 2020-10-07 DIAGNOSIS — Z0189 Encounter for other specified special examinations: Secondary | ICD-10-CM

## 2020-10-07 DIAGNOSIS — J939 Pneumothorax, unspecified: Secondary | ICD-10-CM

## 2020-10-07 DIAGNOSIS — K760 Fatty (change of) liver, not elsewhere classified: Secondary | ICD-10-CM | POA: Diagnosis not present

## 2020-10-07 DIAGNOSIS — G4733 Obstructive sleep apnea (adult) (pediatric): Secondary | ICD-10-CM | POA: Diagnosis not present

## 2020-10-07 DIAGNOSIS — I081 Rheumatic disorders of both mitral and tricuspid valves: Secondary | ICD-10-CM | POA: Diagnosis not present

## 2020-10-07 DIAGNOSIS — I2510 Atherosclerotic heart disease of native coronary artery without angina pectoris: Secondary | ICD-10-CM | POA: Diagnosis present

## 2020-10-07 DIAGNOSIS — E669 Obesity, unspecified: Secondary | ICD-10-CM | POA: Diagnosis present

## 2020-10-07 DIAGNOSIS — Z01810 Encounter for preprocedural cardiovascular examination: Secondary | ICD-10-CM | POA: Diagnosis not present

## 2020-10-07 HISTORY — DX: Hyperglycemia, unspecified: R73.9

## 2020-10-07 HISTORY — DX: Unstable angina: I20.0

## 2020-10-07 HISTORY — DX: Fatty (change of) liver, not elsewhere classified: K76.0

## 2020-10-07 HISTORY — DX: Atherosclerotic heart disease of native coronary artery without angina pectoris: I25.10

## 2020-10-07 LAB — CBC WITH DIFFERENTIAL/PLATELET
Abs Immature Granulocytes: 0.06 10*3/uL (ref 0.00–0.07)
Basophils Absolute: 0.1 10*3/uL (ref 0.0–0.1)
Basophils Relative: 1 %
Eosinophils Absolute: 0.1 10*3/uL (ref 0.0–0.5)
Eosinophils Relative: 1 %
HCT: 44.2 % (ref 39.0–52.0)
Hemoglobin: 15.4 g/dL (ref 13.0–17.0)
Immature Granulocytes: 1 %
Lymphocytes Relative: 23 %
Lymphs Abs: 2 10*3/uL (ref 0.7–4.0)
MCH: 30.3 pg (ref 26.0–34.0)
MCHC: 34.8 g/dL (ref 30.0–36.0)
MCV: 86.8 fL (ref 80.0–100.0)
Monocytes Absolute: 0.6 10*3/uL (ref 0.1–1.0)
Monocytes Relative: 7 %
Neutro Abs: 5.9 10*3/uL (ref 1.7–7.7)
Neutrophils Relative %: 67 %
Platelets: 177 10*3/uL (ref 150–400)
RBC: 5.09 MIL/uL (ref 4.22–5.81)
RDW: 12.7 % (ref 11.5–15.5)
WBC: 8.7 10*3/uL (ref 4.0–10.5)
nRBC: 0 % (ref 0.0–0.2)

## 2020-10-07 LAB — COMPREHENSIVE METABOLIC PANEL
ALT: 28 U/L (ref 0–44)
AST: 26 U/L (ref 15–41)
Albumin: 3.6 g/dL (ref 3.5–5.0)
Alkaline Phosphatase: 67 U/L (ref 38–126)
Anion gap: 8 (ref 5–15)
BUN: 18 mg/dL (ref 8–23)
CO2: 23 mmol/L (ref 22–32)
Calcium: 9.5 mg/dL (ref 8.9–10.3)
Chloride: 106 mmol/L (ref 98–111)
Creatinine, Ser: 1.13 mg/dL (ref 0.61–1.24)
GFR, Estimated: >60 mL/min (ref >60)
Glucose, Bld: 121 mg/dL — ABNORMAL HIGH (ref 70–99)
Potassium: 4.2 mmol/L (ref 3.5–5.1)
Sodium: 137 mmol/L (ref 135–145)
Total Bilirubin: 0.9 mg/dL (ref 0.3–1.2)
Total Protein: 6.8 g/dL (ref 6.5–8.1)

## 2020-10-07 LAB — ECHOCARDIOGRAM COMPLETE
Area-P 1/2: 2.69 cm2
Height: 72 in
S' Lateral: 2.9 cm
Weight: 3918.9 oz

## 2020-10-07 LAB — HEPARIN LEVEL (UNFRACTIONATED): Heparin Unfractionated: <0.1 IU/mL — ABNORMAL LOW (ref 0.30–0.70)

## 2020-10-07 LAB — PROTIME-INR
INR: 1 (ref 0.8–1.2)
Prothrombin Time: 13.6 seconds (ref 11.4–15.2)

## 2020-10-07 LAB — RESP PANEL BY RT-PCR (FLU A&B, COVID) ARPGX2
Influenza A by PCR: NEGATIVE
Influenza B by PCR: NEGATIVE
SARS Coronavirus 2 by RT PCR: NEGATIVE

## 2020-10-07 LAB — TROPONIN I (HIGH SENSITIVITY)
Troponin I (High Sensitivity): 16 ng/L (ref <18)
Troponin I (High Sensitivity): 15 ng/L (ref <18)

## 2020-10-07 MED ORDER — ONDANSETRON HCL 4 MG/2ML IJ SOLN: 4.0000 mg | Freq: Four times a day (QID) | INTRAMUSCULAR | Status: DC | PRN

## 2020-10-07 MED ORDER — NITROGLYCERIN IN D5W 200-5 MCG/ML-% IV SOLN: 5.0000 ug/min | INTRAVENOUS | Status: DC

## 2020-10-07 MED ORDER — METOPROLOL TARTRATE 50 MG PO TABS
50.0000 mg | ORAL_TABLET | Freq: Two times a day (BID) | ORAL | Status: DC
  Administered 2020-10-07 – 2020-10-08 (×3): 50 mg via ORAL
  Filled 2020-10-07 (×3): qty 1

## 2020-10-07 MED ORDER — ACETAMINOPHEN 325 MG PO TABS: 650.0000 mg | ORAL_TABLET | ORAL | Status: DC | PRN

## 2020-10-07 MED ORDER — SODIUM CHLORIDE 0.9% FLUSH
3.0000 mL | Freq: Two times a day (BID) | INTRAVENOUS | Status: DC
  Administered 2020-10-07 – 2020-10-10 (×6): 3 mL via INTRAVENOUS

## 2020-10-07 MED ORDER — FENOFIBRATE 160 MG PO TABS
160.0000 mg | ORAL_TABLET | Freq: Every day | ORAL | Status: DC
  Administered 2020-10-07 – 2020-10-14 (×7): 160 mg via ORAL
  Filled 2020-10-07 (×7): qty 1

## 2020-10-07 MED ORDER — HEPARIN (PORCINE) 25000 UT/250ML-% IV SOLN
1900.0000 [IU]/h | INTRAVENOUS | Status: DC
  Administered 2020-10-07: 1300 [IU]/h via INTRAVENOUS
  Administered 2020-10-08: 1650 [IU]/h via INTRAVENOUS
  Administered 2020-10-08: 1850 [IU]/h via INTRAVENOUS
  Administered 2020-10-09 – 2020-10-11 (×4): 1900 [IU]/h via INTRAVENOUS
  Filled 2020-10-07 (×7): qty 250

## 2020-10-07 MED ORDER — HEPARIN BOLUS VIA INFUSION
3000.0000 [IU] | Freq: Once | INTRAVENOUS | Status: AC
  Administered 2020-10-07: 3000 [IU] via INTRAVENOUS
  Filled 2020-10-07: qty 3000

## 2020-10-07 MED ORDER — OMEGA-3-ACID ETHYL ESTERS 1 G PO CAPS
2.0000 g | ORAL_CAPSULE | Freq: Two times a day (BID) | ORAL | Status: DC
  Administered 2020-10-07 – 2020-10-15 (×14): 2 g via ORAL
  Filled 2020-10-07 (×15): qty 2

## 2020-10-07 MED ORDER — SODIUM CHLORIDE 0.9 % IV SOLN: 250.0000 mL | INTRAVENOUS | Status: DC | PRN

## 2020-10-07 MED ORDER — HEPARIN BOLUS VIA INFUSION
4000.0000 [IU] | Freq: Once | INTRAVENOUS | Status: AC
  Administered 2020-10-07: 4000 [IU] via INTRAVENOUS
  Filled 2020-10-07: qty 4000

## 2020-10-07 MED ORDER — NITROGLYCERIN 0.4 MG SL SUBL
0.4000 mg | SUBLINGUAL_TABLET | SUBLINGUAL | Status: DC | PRN
  Administered 2020-10-07 – 2020-10-11 (×3): 0.4 mg via SUBLINGUAL
  Filled 2020-10-07 (×2): qty 1

## 2020-10-07 MED ORDER — AMLODIPINE BESYLATE 10 MG PO TABS
10.0000 mg | ORAL_TABLET | Freq: Every day | ORAL | Status: DC
  Administered 2020-10-07 – 2020-10-10 (×4): 10 mg via ORAL
  Filled 2020-10-07 (×4): qty 1

## 2020-10-07 MED ORDER — ASPIRIN EC 81 MG PO TBEC
81.0000 mg | DELAYED_RELEASE_TABLET | Freq: Every day | ORAL | Status: DC
  Administered 2020-10-07 – 2020-10-10 (×4): 81 mg via ORAL
  Filled 2020-10-07 (×4): qty 1

## 2020-10-07 MED ORDER — SODIUM CHLORIDE 0.9% FLUSH: 3.0000 mL | INTRAVENOUS | Status: DC | PRN

## 2020-10-07 MED ORDER — ROSUVASTATIN CALCIUM 20 MG PO TABS
20.0000 mg | ORAL_TABLET | Freq: Every day | ORAL | Status: DC
  Administered 2020-10-07 – 2020-10-14 (×7): 20 mg via ORAL
  Filled 2020-10-07 (×7): qty 1

## 2020-10-07 MED ORDER — FEBUXOSTAT 40 MG PO TABS
40.0000 mg | ORAL_TABLET | Freq: Every day | ORAL | Status: DC
  Administered 2020-10-07 – 2020-10-13 (×6): 40 mg via ORAL
  Filled 2020-10-07 (×10): qty 1

## 2020-10-07 NOTE — ED Provider Notes (Addendum)
[facility] EMERGENCY DEPARTMENT Provider Note   CSN: [id number] Arrival date & time: 10/07/20  3295     History Chief Complaint  Patient presents with   Chest Pain    Phillip Terry is a 75 y.o. male.  Patient with history of obesity, high blood pressure, lipids, significant CAD, pulmonary fibrosis, followed with cardiology and cardiothoracic surgery presents with midsternal chest pain rating down left arm similar to previous.  Patient's had worsening episodes the past 3 evenings.  Patient notified cardiothoracic surgeon and was told to come in if this recurs.  Patient has CABG scheduled for September 12 Dr. Cliffton Asters.  Patient denies any fevers, cough, headaches or other symptoms.  Patient has had angina in the past and last 3 nights he is taken nitro and it helps however it is becoming more significant and lasting longer more severe.  Patient had aspirin on route.  No anticoagulant use.      Past Medical History:  Diagnosis Date   Calculus of kidney    Chest pain    DOE (dyspnea on exertion) 10/27/2019   Essential hypertension    Hyperlipidemia    OSA on CPAP 03/16/2007   RDI 39 Sat 89%   Peripheral neuropathy    Pulmonary fibrosis (HCC) 10/27/2019    Patient Active Problem List   Diagnosis Date Noted   Coronary artery disease of native artery of native heart with stable angina pectoris (HCC)    Chest pain 08/22/2020   DOE (dyspnea on exertion) 10/27/2019   Pulmonary fibrosis (HCC) 10/27/2019   Essential hypertension    Hyperlipidemia    Peripheral neuropathy    Calculus of kidney    OSA on CPAP 03/16/2007    Past Surgical History:  Procedure Laterality Date   CATARACT EXTRACTION Bilateral    FOOT SURGERY Left 2012   gastronemius slide procedure   LEFT HEART CATH AND CORONARY ANGIOGRAPHY N/A 09/23/2020   Procedure: LEFT HEART CATH AND CORONARY ANGIOGRAPHY;  Surgeon: Corky Crafts, MD;  Location: [facility];  Service: Cardiovascular;   Laterality: N/A;   LEG SURGERY Left 2012   Gastronemius slide procedure   TONSILLECTOMY         Family History  Problem Relation Age of Onset   Breast cancer Mother    CAD Father    Fibromyalgia Sister     Social History   Tobacco Use   Smoking status: Never   Smokeless tobacco: Never  Vaping Use   Vaping Use: Never used  Substance Use Topics   Alcohol use: Never   Drug use: Never    Home Medications Prior to Admission medications   Medication Sig Start Date End Date Taking? Authorizing Provider  albuterol (VENTOLIN HFA) 108 (90 Base) MCG/ACT inhaler Inhale 2 puffs into the lungs every 4 (four) hours as needed for shortness of breath. 08/27/19   [provider]  amLODipine-valsartan (EXFORGE) 10-320 MG tablet Take 1 tablet by mouth at bedtime. 08/18/19   [provider]  aspirin 81 MG chewable tablet Chew 81 mg by mouth at bedtime.    [provider]  febuxostat (ULORIC) 40 MG tablet Take 40 mg by mouth at bedtime.    [provider]  fenofibrate 160 MG tablet Take 160 mg by mouth at bedtime. 07/24/19   [provider]  metoprolol tartrate (LOPRESSOR) 50 MG tablet Take 50 mg by mouth in the morning and at bedtime. 08/10/19   [provider]  nitroGLYCERIN (NITROSTAT) 0.4  MG SL tablet Place 0.4 mg under the tongue every 5 (five) minutes x 3 doses as needed for chest pain. 07/22/20   [provider]  omega-3 acid ethyl esters (LOVAZA) 1 g capsule TAKE 2 CAPSULES BY MOUTH TWICE DAILY 09/05/20   Baldo Daub, MD  rosuvastatin (CRESTOR) 20 MG tablet Take 1 tablet (20 mg total) by mouth at bedtime. 09/23/20 12/22/20  Corky Crafts, MD    Allergies    Patient has no known allergies.  Review of Systems   Review of Systems  Constitutional:  Negative for chills and fever.  HENT:  Negative for congestion.   Eyes:  Negative for visual disturbance.  Respiratory:  Negative for shortness of breath.   Cardiovascular:   Positive for chest pain.  Gastrointestinal:  Negative for abdominal pain and vomiting.  Genitourinary:  Negative for dysuria and flank pain.  Musculoskeletal:  Negative for back pain, neck pain and neck stiffness.  Skin:  Negative for rash.  Neurological:  Negative for light-headedness and headaches.   Physical Exam Updated Vital Signs BP 127/75   Pulse (!) 54   Temp 98.3 F (36.8 C) (Oral)   Resp 15   SpO2 95%   Physical Exam Vitals and nursing note reviewed.  Constitutional:      General: He is not in acute distress.    Appearance: He is well-developed.  HENT:     Head: Normocephalic and atraumatic.     Mouth/Throat:     Mouth: Mucous membranes are moist.  Eyes:     General:        Right eye: No discharge.        Left eye: No discharge.     Conjunctiva/sclera: Conjunctivae normal.  Neck:     Trachea: No tracheal deviation.  Cardiovascular:     Rate and Rhythm: Normal rate and regular rhythm.     Heart sounds: No murmur heard. Pulmonary:     Effort: Pulmonary effort is normal.     Breath sounds: Normal breath sounds.  Abdominal:     General: There is no distension.     Palpations: Abdomen is soft.     Tenderness: There is no abdominal tenderness. There is no guarding.  Musculoskeletal:        General: Normal range of motion.     Cervical back: Normal range of motion and neck supple. No rigidity.     Right lower leg: No edema.     Left lower leg: No edema.  Skin:    General: Skin is warm.     Capillary Refill: Capillary refill takes less than 2 seconds.     Findings: No rash.  Neurological:     General: No focal deficit present.     Mental Status: He is alert.     Cranial Nerves: No cranial nerve deficit.  Psychiatric:        Mood and Affect: Mood normal.    ED Results / Procedures / Treatments   Labs (all labs ordered are listed, but only abnormal results are displayed) Labs Reviewed  COMPREHENSIVE METABOLIC PANEL - Abnormal; Notable for the following  components:      Result Value   Glucose, Bld 121 (*)    All other components within normal limits  RESP PANEL BY RT-PCR (FLU A&B, COVID) ARPGX2  CBC WITH DIFFERENTIAL/PLATELET  CBC WITH DIFFERENTIAL/PLATELET  TROPONIN I (HIGH SENSITIVITY)    EKG None  Radiology DG Chest Portable 1 View  Result Date: 10/07/2020 CLINICAL  DATA:  Chest pain EXAM: PORTABLE CHEST 1 VIEW COMPARISON:  Chest radiograph 10/27/2019 FINDINGS: The heart is at the upper limits of normal for size. The mediastinal contours are normal. There is no focal consolidation or pulmonary edema. There is no pleural effusion or pneumothorax. Coarsened interstitial markings are again seen, unchanged. There is no acute osseous abnormality. IMPRESSION: No radiographic evidence of acute cardiopulmonary process. Electronically Signed   By: Lesia Hausen M.D.   On: 10/07/2020 08:26    Procedures Procedures  CRITICAL CARE Performed by: Enid Skeens   Total critical care time: 35 minutes  Critical care time was exclusive of separately billable procedures and treating other patients.  Critical care was necessary to treat or prevent imminent or life-threatening deterioration.  Critical care was time spent personally by me on the following activities: development of treatment plan with patient and/or surrogate as well as nursing, discussions with consultants, evaluation of patient's response to treatment, examination of patient, obtaining history from patient or surrogate, ordering and performing treatments and interventions, ordering and review of laboratory studies, ordering and review of radiographic studies, pulse oximetry and re-evaluation of patient's condition.  Medications Ordered in ED Medications - No data to display  ED Course  I have reviewed the triage vital signs and the nursing notes.  Pertinent labs & imaging results that were available during my care of the patient were reviewed by me and considered in my medical  decision making (see chart for details).    MDM Rules/Calculators/A&P                           Patient presents with clinical concern for unstable angina.  Patient has known significant coronary artery disease and follows up with cardiothoracic surgery and cardiology outpatient.  Patient's had worsening angina the past 3 mornings around 2:00 in the morning.  Currently chest pain-free.  EKG reviewed no STEMI.  Plan for blood work, troponin, paged cardiothoracic surgeon.  Blood work reviewed electrolytes unremarkable, troponin negative.  CBC pending.  Discussed with Dr. Cliffton Asters who agrees with admission, recommended cardiology admit.  Updated cardiology for admission.  Cardiology will determine if heparin needed. Final Clinical Impression(s) / ED Diagnoses Final diagnoses:  Unstable angina [facility])    Rx / DC Orders ED Discharge Orders     None        Blane Ohara, MD 10/07/20 [id number]    Blane Ohara, MD 11/16/20 [phone]

## 2020-10-07 NOTE — Progress Notes (Signed)
ANTICOAGULATION CONSULT NOTE - Initial Consult  Pharmacy Consult for Heparin Indication: chest pain/ACS  No Known Allergies  Patient Measurements:   Heparin Dosing Weight: 101.2 kg  Vital Signs: Temp: 98.3 F (36.8 C) (08/26 1215) Temp Source: Oral (08/26 1215) BP: 117/68 (08/26 1215) Pulse Rate: 56 (08/26 1215)  Labs: Recent Labs    10/07/20 0740 10/07/20 0856 10/07/20 0958  HGB  --  15.4  --   HCT  --  44.2  --   PLT  --  177  --   CREATININE 1.13  --   --   TROPONINIHS 16  --  15   Estimated Creatinine Clearance: 74.9 mL/min (by C-G formula based on SCr of 1.13 mg/dL).  Medical History: Past Medical History:  Diagnosis Date   CAD in native artery    Calculus of kidney    Essential hypertension    Hepatic steatosis    Hyperlipidemia    OSA on CPAP 03/16/2007   RDI 39 Sat 89%   Peripheral neuropathy    Pulmonary fibrosis (HCC) 10/27/2019   Medications:  (Not in a hospital admission)  Scheduled:  Infusions:   Assessment: 46 yom with history of multivessel coronary artery disease pending OP hybrid CABG/PCI 10/24/20, idiopathic pulmonary fibrosis, HTN, HLD, OSA compliant with CPAP . Patient presenting with chest pain. Heparin per pharmacy consult placed for unstable angina.  Patient is not on anticoagulation prior to arrival. Hgb 15.4; PLT 177  Goal of Therapy:  Heparin level 0.3-0.7 units/ml Monitor platelets by anticoagulation protocol: Yes   Plan:  Give 4000 units bolus x 1 Start heparin infusion at 1300 units/hr Check anti-Xa level in 6-8 hours and daily while on heparin Continue to monitor H&H and platelets  Delmar Landau, PharmD, BCPS 10/07/2020 2:09 PM ED Clinical Pharmacist -  [phone]

## 2020-10-07 NOTE — ED Triage Notes (Signed)
Pt from home BIB EMS for c/o midsternal CP radiating down left arm since around 0300 this am. Pt reports some associated SHOB. Pt took nitro SL x3. 324 ASA given by EMS in route. Pt states he has a CABG scheduled for 10/24/20. BGL 121.

## 2020-10-07 NOTE — Progress Notes (Signed)
     [address]       [name] [address]             [phone]       Mr. Dunn is well-known to our service.  He was originally evaluated for a robotic assisted hybrid MIDCAB as an outpatient.  He represents to the emergency department with nocturnal symptoms.  He is currently chest pain-free.  Given his presentation and timing, we will plan for a traditional CABG this hospitalization.  Patient still requires an echocardiogram as well as other studies.  We will continue to follow.  Elody Kleinsasser Keane Scrape

## 2020-10-07 NOTE — H&P (Addendum)
Cardiology Admission History and Physical:   Patient ID: Phillip Terry MRN: [id number]; DOB: 12-Jun-1945   Admission date: 10/07/2020  PCP:  Paulina FusiSchultz, Douglas E, MD   [facility] Providers Cardiologist:  Norman HerrlichBrian Munley, MD        Chief Complaint:  chest pain  Patient Profile:   Phillip Terry is a 75 y.o. male with recently diagnosed multivessel coronary artery disease pending OP hybrid CABG/PCI 10/24/20, idiopathic pulmonary fibrosis, HTN, HLD, OSA compliant with CPAP who is being seen 10/07/2020 for the evaluation of chest pain.  History of Present Illness:   Phillip Terry was previously evaluated at [facility] for severe progressive exertional dyspnea in 08/2019. At that time 2D Echo showed EF low-normal at 50%, impaired diastolic relaxation, trace mitral regurgitation, mild tricuspid regurgitation, RVSP 27mmHg. Lexiscan stress testing showed no ischemia or infarction; mild inferior septal hypokinesis, wall motion otherwise normal. Chest CT demonstrated pulmonary fibrosis. [facility]. He had a reported f/u CT of the chest 10/2019 which showed continued pulmonary fibrosis pattern of UIP as well as coronary and aortic atherosclerosis and hepatic steatosis.   He initially was referred to pulmonary rehab and had significant improvement in his exercise tolerance and dyspnea. However, in July 2022, he began to develop episodes of chest pain/pressure so coronary CTA was performed which was very abnormal with concern for obstructive CAD. This also showed suspicion for interstitial lung disease as outlined on prior CT as well as hepatic steatosis. He underwent cardiac cath 09/23/20 with findings outlined below with multivessel coronary artery disease and EF 55-60%. The patient was referred to CVTS as outpatient and saw Dr. Cliffton AstersLightfoot 09/29/20. Per consult note, he discussed the patient with Dr. Eldridge DaceVaranasi and they felt he was a good candidate for robotic assisted MIDCAB and potential PCI  treatment to RCA and diagonal. This was arranged for 10/24/20 with return precautions. Dr.Lightfoot ordered a f/u echocardiogram, PFTs, and repeat CT of his chest (to re-eval pulm fibrosis) which are pending at this time.    Prior to completion of these, he now presents with increasing angina. Over the past 3 nights he has been awakened by chest pain going down both arms between 2-4 AM. The first night it resolved on its own without intervention within minutes. The second night it resolved with 1 SL NTG. This morning however, he had 3 episodes between 3-6am requiring multiple NTG so he called EMS. By the time they arrived he was pain free. Here in the ED he recalls having 2 additional brief episodes so got SL NTG around 11:30am. He is asymptomatic currently. He took his metoprolol and fish oil this AM, otherwise takes all other meds including ASA at night. Labs are stable wit normal CMET except glucose 121, normal CBC, negative troponins x 2, CXR NAD, negative Covid. EKG shows NSR with TWI III, NSIVCD, LAD, similar to prior without acute changes.  Past Medical History:  Diagnosis Date   CAD in native artery    Calculus of kidney    Essential hypertension    Hepatic steatosis    Hyperlipidemia    OSA on CPAP 03/16/2007   RDI 39 Sat 89%   Peripheral neuropathy    Pulmonary fibrosis (HCC) 10/27/2019    Past Surgical History:  Procedure Laterality Date   CATARACT EXTRACTION Bilateral    FOOT SURGERY Left 2012   gastronemius slide procedure   LEFT HEART CATH AND CORONARY ANGIOGRAPHY N/A 09/23/2020   Procedure: LEFT HEART CATH AND CORONARY  ANGIOGRAPHY;  Surgeon: Corky Crafts, MD;  Location: [facility] INVASIVE CV LAB;  Service: Cardiovascular;  Laterality: N/A;   LEG SURGERY Left 2012   Gastronemius slide procedure   TONSILLECTOMY       Medications Prior to Admission: Prior to Admission medications   Medication Sig Start Date End Date Taking? Authorizing Provider  amLODipine-valsartan  (EXFORGE) 10-320 MG tablet Take 1 tablet by mouth at bedtime. 08/18/19  Yes [provider]  aspirin EC 81 MG tablet Take 81 mg by mouth at bedtime. Swallow whole.   Yes [provider]  febuxostat (ULORIC) 40 MG tablet Take 40 mg by mouth at bedtime.   Yes [provider]  fenofibrate 160 MG tablet Take 160 mg by mouth at bedtime. 07/24/19  Yes [provider]  metoprolol tartrate (LOPRESSOR) 50 MG tablet Take 50 mg by mouth in the morning and at bedtime. 08/10/19  Yes [provider]  nitroGLYCERIN (NITROSTAT) 0.4 MG SL tablet Place 0.4 mg under the tongue every 5 (five) minutes x 3 doses as needed for chest pain. 07/22/20  Yes [provider]  omega-3 acid ethyl esters (LOVAZA) 1 g capsule TAKE 2 CAPSULES BY MOUTH TWICE DAILY Patient taking differently: Take 2 g by mouth 2 (two) times daily. 09/05/20  Yes Baldo Daub, MD  rosuvastatin (CRESTOR) 20 MG tablet Take 1 tablet (20 mg total) by mouth at bedtime. 09/23/20 12/22/20 Yes Corky Crafts, MD     Allergies:   No Known Allergies  Social History:   Social History   Socioeconomic History   Marital status: Married    Spouse name: Not on file   Number of children: Not on file   Years of education: Not on file   Highest education level: Not on file  Occupational History   Not on file  Tobacco Use   Smoking status: Never   Smokeless tobacco: Never  Vaping Use   Vaping Use: Never used  Substance and Sexual Activity   Alcohol use: Never   Drug use: Never   Sexual activity: Not on file  Other Topics Concern   Not on file  Social History Narrative   Not on file   Social Determinants of Health   Financial Resource Strain: Not on file  Food Insecurity: Not on file  Transportation Needs: Not on file  Physical Activity: Not on file  Stress: Not on file  Social Connections: Not on file  Intimate Partner Violence: Not on file    Family History:   The patient's family  history includes Breast cancer in his mother; CAD in his father; Fibromyalgia in his sister.    ROS:  Please see the history of present illness.  All other ROS reviewed and negative.     Physical Exam/Data:   Vitals:   10/07/20 1100 10/07/20 1115 10/07/20 1130 10/07/20 1215  BP: 132/72 136/76 138/75 117/68  Pulse: (!) 55 (!) 56 (!) 56 (!) 56  Resp: 18  16 16   Temp:    98.3 F (36.8 C)  TempSrc:    Oral  SpO2: 97% 98% 99% 98%   No intake or output data in the 24 hours ending 10/07/20 1346 Last 3 Weights 09/29/2020 09/23/2020 09/16/2020  Weight (lbs) 245 lb 247 lb 243 lb  Weight (kg) 111.131 kg 112.038 kg 110.224 kg     There is no height or weight on file to calculate BMI.  General: Well developed, well nourished obese WM, in no acute distress. Head:  Normocephalic, atraumatic, sclera non-icteric, no xanthomas, nares are without discharge. Neck: Negative for carotid bruits. JVP not elevated. Lungs: Clear bilaterally to auscultation without wheezes, rales, or rhonchi. Breathing is unlabored. Heart: RRR S1 S2 without murmurs, rubs, or gallops.  Abdomen: Soft, non-tender, non-distended with normoactive bowel sounds. No rebound/guarding. Extremities: No clubbing or cyanosis. No edema. Distal pedal pulses are 2+ and equal bilaterally.  Neuro: Alert and oriented X 3. Moves all extremities spontaneously. Mildly hard of hearing - wears hearing aids and did not have them with him today Psych:  Responds to questions appropriately with a normal affect.  EKG:  The ECG that was done today was personally reviewed and demonstrates NSR 60bpm, poor R wave progression, left axis deviation, NSIVCD (QRS ), TWI III (Multiple tracings reviewed similarly)  Relevant CV Studies: Lexiscan and Echo 08/2019 - see scan  Cath 09/23/20 Prox RCA to Mid RCA lesion is 60-70% stenosed.   Dist Cx lesion is 50% stenosed.   Mid LAD lesion is 75% stenosed.  This is a long segment of disease starting immediately  after the large diagonal branch.   Prox LAD lesion is 90% stenosed.   2nd Diag lesion is 80% stenosed.  This is a small vessel which likely could be treated medically.   RPAV lesion is 50% stenosed.   The left ventricular systolic function is normal.   LV end diastolic pressure is normal.   The left ventricular ejection fraction is 55-65% by visual estimate.   There is no aortic valve stenosis.   Severe, calcific disease most notably in the proximal and mid LAD.  The long segment of disease in the mid LAD involves several diagonal vessel ostia.  Moderate disease in the RCA.  We will plan for cardiac surgery referral for consideration of robotic mid CAB procedure with LIMA to LAD.  I suspect his symptoms would resolve with just that revascularization.  If symptoms persisted, the RCA is certainly treatable percutaneously and a hybrid approach to his revascularization could be considered.  Severe tortuosity of the right subclavian.  If intervention was needed, would consider using long destination sheath from the right radial to engage the RCA.  Engaging the left main was much easier.   Results discussed with the patient's daughter.  Echocardiogram will be obtained.  Referral made to Dr. Cliffton Asters.  Laboratory Data:  High Sensitivity Troponin:   Recent Labs  Lab 10/07/20 0740 10/07/20 0958  TROPONINIHS 16 15      Chemistry Recent Labs  Lab 10/07/20 0740  NA 137  K 4.2  CL 106  CO2 23  GLUCOSE 121*  BUN 18  CREATININE 1.13  CALCIUM 9.5  GFRNONAA >60  ANIONGAP 8    Recent Labs  Lab 10/07/20 0740  PROT 6.8  ALBUMIN 3.6  AST 26  ALT 28  ALKPHOS 67  BILITOT 0.9   Hematology Recent Labs  Lab 10/07/20 0856  WBC 8.7  RBC 5.09  HGB 15.4  HCT 44.2  MCV 86.8  MCH 30.3  MCHC 34.8  RDW 12.7  PLT 177   BNPNo results for input(s): BNP, PROBNP in the last 168 hours.  DDimer No results for input(s): DDIMER in the last 168 hours.   Radiology/Studies:  DG Chest  Portable 1 View  Result Date: 10/07/2020 CLINICAL DATA:  Chest pain EXAM: PORTABLE CHEST 1 VIEW COMPARISON:  Chest radiograph 10/27/2019 FINDINGS: The heart is at the upper limits of normal for size. The mediastinal contours are normal. There is no focal  consolidation or pulmonary edema. There is no pleural effusion or pneumothorax. Coarsened interstitial markings are again seen, unchanged. There is no acute osseous abnormality. IMPRESSION: No radiographic evidence of acute cardiopulmonary process. Electronically Signed   By: Lesia Hausen M.D.   On: 10/07/2020 08:26     Assessment and Plan:   1. Unstable angina with known multivessel CAD - symptoms are consistent with accelerating angina - per d/w Dr. Excell Seltzer, will admit, place on IV heparin per pharmacy, place on IV NTG - continue ASA - continue statin - continue BB - Dr. Excell Seltzer will review with Dr. Marily Lente. Eldridge Dace - will go ahead and update echo while he is here  2. Essential HTN - BP controlled on home regimen - we'll plan to hold his valsartan component of ARB to have BP room to add IV NTG, but otherwise continue metoprolol and amlodipine  (takes all meds at night except metoprolol and fish oil which are BID)  3. Hyperlipidemia - last LDL 02/2020 91 - rosuvastatin just recently prescribed 09/16/20 - will need recheck liver/lipids in early October - continue home fenofibrate, Lovaza  4. Pulmonary fibrosis - pulse ox stable - was recently pending f/u OP CT chest without contrast per CVTS, but will inquire with Dr. Excell Seltzer whether the CT overread from 09/2020 study will suffice  5. OSA - continue CPAP QHS  6. Hepatic steatosis - incidental finding noted on CT 09/2020, LFTs normal - will need f/u primary care for this  7. Mild hyperglycemia - check A1C with labs  Risk Assessment/Risk Scores:    TIMI Risk Score for Unstable Angina or Non-ST Elevation MI:   The patient's TIMI risk score is 5, which indicates a 26% risk of all  cause mortality, new or recurrent myocardial infarction or need for urgent revascularization in the next 14 days.   Severity of Illness: The appropriate patient status for this patient is INPATIENT. Inpatient status is judged to be reasonable and necessary in order to provide the required intensity of service to ensure the patient's safety. The patient's presenting symptoms, physical exam findings, and initial radiographic and laboratory data in the context of their chronic comorbidities is felt to place them at high risk for further clinical deterioration. Furthermore, it is not anticipated that the patient will be medically stable for discharge from the hospital within 2 midnights of admission. The following factors support the patient status of inpatient.   " The patient's presenting symptoms include chest pain. " The worrisome physical exam findings include complaints of chest pain. " The initial radiographic and laboratory data are worrisome because of abnormal cardiac cath. " The chronic co-morbidities include multivessel CAD, HTN, HLD, OSA.   * I certify that at the point of admission it is my clinical judgment that the patient will require inpatient hospital care spanning beyond 2 midnights from the point of admission due to high intensity of service, high risk for further deterioration and high frequency of surveillance required.*   For questions or updates, please contact [facility] Please consult [email or web address] for contact info under     Signed, Laurann Montana, PA-C  10/07/2020 1:46 PM   Patient seen, examined. Available data reviewed. Agree with findings, assessment, and plan as outlined by Ronie Spies, PA-C.  The patient is alert, oriented, in no distress.  HEENT is normal, JVP is normal, carotid upstrokes are normal without bruits, lungs are clear to auscultation bilaterally, heart is regular rate and rhythm no murmur gallop, abdomen soft nontender, no organomegaly,  extremities  have no edema, skin is warm dry with no rash, neurologic is grossly intact with strength 5/5 bilaterally.  EKG shows sinus bradycardia 55 bpm, nonspecific T wave abnormality.  High-sensitivity troponin is essentially negative x2 with values of 16 and 15.  Other labs are unremarkable.  Chest x-ray shows no acute process.  I reviewed the patient's cardiac catheterization films.  He has severe calcific proximal LAD stenosis, severe mid LAD stenosis, and moderately severe mid RCA stenosis.  Revascularization options include MIDCAB surgery versus traditional multivessel CABG.  I reviewed the pros and cons of these options with the patient and his wife who is present at the bedside.  They will discuss further with Dr. Cliffton Asters.  In the meantime, we will treat his crescendo angina with IV heparin, IV nitroglycerin, and hospital monitoring/management.  Cardiac surgery to see.  He will undergo echo and CT scans while hospitalized.  Preoperative testing will be completed per the cardiac surgical team.  Tonny Bollman, M.D. 10/07/2020 2:54 PM

## 2020-10-07 NOTE — ED Notes (Signed)
Sent repeat CBC to lab due to first one clotting.

## 2020-10-07 NOTE — Progress Notes (Signed)
ANTICOAGULATION CONSULT NOTE  Pharmacy Consult for Heparin Indication: chest pain/ACS  No Known Allergies  Patient Measurements: Height: 6' (182.9 cm) Weight: 109.9 kg (242 lb 4.6 oz) IBW/kg (Calculated) : 77.6 Heparin Dosing Weight: 101.2 kg  Vital Signs: Temp: 98.3 F (36.8 C) (08/26 2004) Temp Source: Oral (08/26 2004) BP: 123/66 (08/26 2004) Pulse Rate: 70 (08/26 2004)  Labs: Recent Labs    10/07/20 0740 10/07/20 0856 10/07/20 0958 10/07/20 1411 10/07/20 2143  HGB  --  15.4  --   --   --   HCT  --  44.2  --   --   --   PLT  --  177  --   --   --   LABPROT  --   --   --  13.6  --   INR  --   --   --  1.0  --   HEPARINUNFRC  --   --   --   --  <0.10*  CREATININE 1.13  --   --   --   --   TROPONINIHS 16  --  15  --   --     Estimated Creatinine Clearance: 74.5 mL/min (by C-G formula based on SCr of 1.13 mg/dL).  Assessment: 26 yom with history of multivessel coronary artery disease pending OP hybrid CABG/PCI 10/24/20, idiopathic pulmonary fibrosis, HTN, HLD, OSA compliant with CPAP. Patient presenting with chest pain. Heparin per pharmacy consult placed for unstable angina.  Initial heparin level is sub-therapeutic.  No complication with infusion nor bleeding per RN.  Goal of Therapy:  Heparin level 0.3-0.7 units/ml Monitor platelets by anticoagulation protocol: Yes   Plan:  Heparin 3000 units IV bolus, then Increase heparin infusion to 1650 units/hr Check 8 hr heparin level  Sanai Frick D. Laney Potash, PharmD, BCPS, BCCCP 10/07/2020, 10:35 PM

## 2020-10-07 NOTE — ED Notes (Signed)
A/A/Ox4, spouse at bedside.

## 2020-10-07 NOTE — ED Notes (Signed)
Procedure in process

## 2020-10-07 NOTE — Progress Notes (Signed)
  Echocardiogram 2D Echocardiogram has been performed.  Phillip Terry 10/07/2020, 5:33 PM

## 2020-10-08 DIAGNOSIS — I200 Unstable angina: Secondary | ICD-10-CM | POA: Diagnosis not present

## 2020-10-08 LAB — CBC
HCT: 44.5 % (ref 39.0–52.0)
Hemoglobin: 15.1 g/dL (ref 13.0–17.0)
MCH: 29.8 pg (ref 26.0–34.0)
MCHC: 33.9 g/dL (ref 30.0–36.0)
MCV: 87.9 fL (ref 80.0–100.0)
Platelets: 181 10*3/uL (ref 150–400)
RBC: 5.06 MIL/uL (ref 4.22–5.81)
RDW: 12.8 % (ref 11.5–15.5)
WBC: 7 10*3/uL (ref 4.0–10.5)
nRBC: 0 % (ref 0.0–0.2)

## 2020-10-08 LAB — BASIC METABOLIC PANEL
Anion gap: 7 (ref 5–15)
BUN: 16 mg/dL (ref 8–23)
CO2: 24 mmol/L (ref 22–32)
Calcium: 9.3 mg/dL (ref 8.9–10.3)
Chloride: 106 mmol/L (ref 98–111)
Creatinine, Ser: 1.18 mg/dL (ref 0.61–1.24)
GFR, Estimated: >60 mL/min (ref >60)
Glucose, Bld: 117 mg/dL — ABNORMAL HIGH (ref 70–99)
Potassium: 4.5 mmol/L (ref 3.5–5.1)
Sodium: 137 mmol/L (ref 135–145)

## 2020-10-08 LAB — HEPARIN LEVEL (UNFRACTIONATED)
Heparin Unfractionated: 0.29 IU/mL — ABNORMAL LOW (ref 0.30–0.70)
Heparin Unfractionated: 0.31 IU/mL (ref 0.30–0.70)

## 2020-10-08 LAB — HEMOGLOBIN A1C
Hgb A1c MFr Bld: 6.1 % — ABNORMAL HIGH (ref 4.8–5.6)
Mean Plasma Glucose: 128.37 mg/dL

## 2020-10-08 NOTE — Progress Notes (Signed)
ANTICOAGULATION CONSULT NOTE  Pharmacy Consult for Heparin Indication: chest pain/ACS  No Known Allergies  Patient Measurements: Height: 6' (182.9 cm) Weight: 109.3 kg (240 lb 14.4 oz) IBW/kg (Calculated) : 77.6 Heparin Dosing Weight: 101.2 kg  Vital Signs: Temp: 98.3 F (36.8 C) (08/27 0417) Temp Source: Oral (08/27 0417) BP: 119/78 (08/27 0417) Pulse Rate: 50 (08/27 0417)  Labs: Recent Labs    10/07/20 0740 10/07/20 0856 10/07/20 0958 10/07/20 1411 10/07/20 2143  HGB  --  15.4  --   --   --   HCT  --  44.2  --   --   --   PLT  --  177  --   --   --   LABPROT  --   --   --  13.6  --   INR  --   --   --  1.0  --   HEPARINUNFRC  --   --   --   --  <0.10*  CREATININE 1.13  --   --   --   --   TROPONINIHS 16  --  15  --   --     Estimated Creatinine Clearance: 74.4 mL/min (by C-G formula based on SCr of 1.13 mg/dL).  Assessment: 35 yom with history of multivessel coronary artery disease pending OP hybrid CABG/PCI 10/24/20, idiopathic pulmonary fibrosis, HTN, HLD, OSA compliant with CPAP. Patient presenting with chest pain. Heparin per pharmacy consult placed for unstable angina.  Heparin level returned just under therapeutic range. Will omit bolus and increase heparin drip rate.  No complication with infusion nor bleeding per chart review.  Goal of Therapy:  Heparin level 0.3-0.7 units/ml Monitor platelets by anticoagulation protocol: Yes   Plan:  Increase heparin infusion to 1850 units/hr Check 8 hr heparin level  Thank you for allowing pharmacy to participate in this patient's care.  Enos Fling, PharmD PGY1 Pharmacy Resident 10/08/2020 8:26 AM Check [email or web address] for unit specific pharmacy number

## 2020-10-08 NOTE — Plan of Care (Signed)
  Problem: Education: Goal: Knowledge of General Education information will improve Description: Including pain rating scale, medication(s)/side effects and non-pharmacologic comfort measures Outcome: Progressing   Problem: Health Behavior/Discharge Planning: Goal: Ability to manage health-related needs will improve Outcome: Progressing   Problem: Clinical Measurements: Goal: Will remain free from infection Outcome: Progressing Goal: Respiratory complications will improve Outcome: Not Applicable

## 2020-10-08 NOTE — Progress Notes (Signed)
Progress Note  Patient Name: Phillip Terry Date of Encounter: 10/08/2020  [facility] Cardiologist: Norman Herrlich, MD   Subjective   No complaints  Inpatient Medications    Scheduled Meds:  amLODipine  10 mg Oral QHS   aspirin EC  81 mg Oral QHS   febuxostat  40 mg Oral QHS   fenofibrate  160 mg Oral QHS   metoprolol tartrate  50 mg Oral BID   omega-3 acid ethyl esters  2 g Oral BID   rosuvastatin  20 mg Oral QHS   sodium chloride flush  3 mL Intravenous Q12H   Continuous Infusions:  sodium chloride     heparin 1,650 Units/hr (10/08/20 0358)   nitroGLYCERIN     PRN Meds: sodium chloride, acetaminophen, nitroGLYCERIN, ondansetron (ZOFRAN) IV, sodium chloride flush   Vital Signs    Vitals:   10/07/20 1755 10/07/20 2004 10/08/20 0005 10/08/20 0417  BP: (!) 145/77 123/66 110/72 119/78  Pulse: 61 70 61 (!) 50  Resp:  19 18 18   Temp: 98 F (36.7 C) 98.3 F (36.8 C) 98.3 F (36.8 C) 98.3 F (36.8 C)  TempSrc: Oral Oral Oral Oral  SpO2: 96% 96% 97% 95%  Weight:    109.3 kg  Height:        Intake/Output Summary (Last 24 hours) at 10/08/2020 0714 Last data filed at 10/08/2020 0600 Gross per 24 hour  Intake 437.02 ml  Output 1100 ml  Net -662.98 ml   Last 3 Weights 10/08/2020 10/07/2020 10/07/2020  Weight (lbs) 240 lb 14.4 oz 242 lb 4.6 oz 244 lb 14.9 oz  Weight (kg) 109.272 kg 109.9 kg 111.1 kg      Telemetry    Sinus brady - Personally Reviewed  ECG    N/a - Personally Reviewed  Physical Exam   GEN: No acute distress.   Neck: No JVD Cardiac: RRR, no murmurs, rubs, or gallops.  Respiratory: Clear to auscultation bilaterally. GI: Soft, nontender, non-distended  MS: No edema; No deformity. Neuro:  Nonfocal  Psych: Normal affect   Labs    High Sensitivity Troponin:   Recent Labs  Lab 10/07/20 0740 10/07/20 0958  TROPONINIHS 16 15      Chemistry Recent Labs  Lab 10/07/20 0740  NA 137  K 4.2  CL 106  CO2 23  GLUCOSE 121*  BUN 18   CREATININE 1.13  CALCIUM 9.5  PROT 6.8  ALBUMIN 3.6  AST 26  ALT 28  ALKPHOS 67  BILITOT 0.9  GFRNONAA >60  ANIONGAP 8     Hematology Recent Labs  Lab 10/07/20 0856  WBC 8.7  RBC 5.09  HGB 15.4  HCT 44.2  MCV 86.8  MCH 30.3  MCHC 34.8  RDW 12.7  PLT 177    BNPNo results for input(s): BNP, PROBNP in the last 168 hours.   DDimer No results for input(s): DDIMER in the last 168 hours.   Radiology    DG Chest Portable 1 View  Result Date: 10/07/2020 CLINICAL DATA:  Chest pain EXAM: PORTABLE CHEST 1 VIEW COMPARISON:  Chest radiograph 10/27/2019 FINDINGS: The heart is at the upper limits of normal for size. The mediastinal contours are normal. There is no focal consolidation or pulmonary edema. There is no pleural effusion or pneumothorax. Coarsened interstitial markings are again seen, unchanged. There is no acute osseous abnormality. IMPRESSION: No radiographic evidence of acute cardiopulmonary process. Electronically Signed   By: 10/29/2019 M.D.   On: 10/07/2020 08:26  ECHOCARDIOGRAM COMPLETE  Result Date: 10/07/2020    ECHOCARDIOGRAM REPORT   Patient Name:   Phillip Terry Date of Exam: 10/07/2020 Medical Rec #:  [id number]  Height:       72.0 in Accession #:    [id number] Weight:       244.9 lb Date of Birth:  1945-07-09  BSA:          2.322 m Patient Age:    75 years   BP:           136/77 mmHg Patient Gender: M          HR:           65 bpm. Exam Location:  Inpatient Procedure: 2D Echo Indications:    unstable angina  History:        Patient has prior history of Echocardiogram examinations, most                 recent 08/25/2019. CAD; Risk Factors:Hypertension, Dyslipidemia                 and Sleep Apnea.  Sonographer:    Delcie Roch RDCS Referring Phys: 19 DAYNA N DUNN  Sonographer Comments: Suboptimal subcostal window. IMPRESSIONS  1. Left ventricular ejection fraction, by estimation, is 55 to 60%. The left ventricle has normal function. Left ventricular endocardial  border not optimally defined to evaluate regional wall motion, but appears grossly normal. There is mild left ventricular hypertrophy. Left ventricular diastolic parameters are consistent with Grade I diastolic dysfunction (impaired relaxation).  2. Right ventricular systolic function is normal. The right ventricular size is normal.  3. The mitral valve is normal in structure. Trivial mitral valve regurgitation. No evidence of mitral stenosis.  4. The aortic valve is grossly normal. There is mild calcification of the aortic valve. Aortic valve regurgitation is not visualized. No aortic stenosis is present. FINDINGS  Left Ventricle: Left ventricular ejection fraction, by estimation, is 55 to 60%. The left ventricle has normal function. Left ventricular endocardial border not optimally defined to evaluate regional wall motion. The left ventricular internal cavity size was normal in size. There is mild left ventricular hypertrophy. Left ventricular diastolic parameters are consistent with Grade I diastolic dysfunction (impaired relaxation). Right Ventricle: The right ventricular size is normal. No increase in right ventricular wall thickness. Right ventricular systolic function is normal. Left Atrium: Left atrial size was normal in size. Right Atrium: Right atrial size was normal in size. Pericardium: There is no evidence of pericardial effusion. Presence of pericardial fat pad. Mitral Valve: The mitral valve is normal in structure. Trivial mitral valve regurgitation. No evidence of mitral valve stenosis. Tricuspid Valve: The tricuspid valve is normal in structure. Tricuspid valve regurgitation is trivial. No evidence of tricuspid stenosis. Aortic Valve: The aortic valve is grossly normal. There is mild calcification of the aortic valve. Aortic valve regurgitation is not visualized. No aortic stenosis is present. Pulmonic Valve: The pulmonic valve was normal in structure. Pulmonic valve regurgitation is trivial. No  evidence of pulmonic stenosis. Aorta: The aortic root is normal in size and structure. Venous: The inferior vena cava was not well visualized. IAS/Shunts: The interatrial septum was not well visualized.  LEFT VENTRICLE PLAX 2D LVIDd:         4.40 cm  Diastology LVIDs:         2.90 cm  LV e' medial:    6.20 cm/s LV PW:         1.10 cm  LV  E/e' medial:  11.2 LV IVS:        1.10 cm  LV e' lateral:   11.30 cm/s LVOT diam:     2.20 cm  LV E/e' lateral: 6.2 LV SV:         102 LV SV Index:   44 LVOT Area:     3.80 cm  RIGHT VENTRICLE RV S prime:     10.90 cm/s TAPSE (M-mode): 2.0 cm LEFT ATRIUM             Index       RIGHT ATRIUM           Index LA diam:        4.00 cm 1.72 cm/m  RA Area:     11.70 cm LA Vol (A2C):   53.0 ml 22.82 ml/m RA Volume:   23.70 ml  10.21 ml/m LA Vol (A4C):   60.1 ml 25.88 ml/m LA Biplane Vol: 60.0 ml 25.84 ml/m  AORTIC VALVE LVOT Vmax:   121.00 cm/s LVOT Vmean:  73.300 cm/s LVOT VTI:    0.268 m  AORTA Ao Root diam: 3.70 cm Ao Asc diam:  3.50 cm MITRAL VALVE               TRICUSPID VALVE MV Area (PHT): 2.69 cm    TR Peak grad:   26.4 mmHg MV Decel Time: 282 msec    TR Vmax:        257.00 cm/s MV E velocity: 69.70 cm/s MV A velocity: 79.20 cm/s  SHUNTS MV E/A ratio:  0.88        Systemic VTI:  0.27 m                            Systemic Diam: 2.20 cm Weston Brass MD Electronically signed by Weston Brass MD Signature Date/Time: 10/07/2020/5:43:49 PM    Final     Cardiac Studies     Patient Profile     Phillip Terry is a 75 y.o. male with recently diagnosed multivessel coronary artery disease pending OP hybrid CABG/PCI 10/24/20, idiopathic pulmonary fibrosis, HTN, HLD, OSA compliant with CPAP who is being seen 10/07/2020 for the evaluation of chest pain.  Assessment & Plan   1.CAD/Unstable angina - 09/2020 cath: prox LAD 90%, mid LAD 75%, D2 80%, LCX distal 50%, prox RCA to mid 60%, RPAV 50% - 09/2020 echo LVEF 55-60%, grade Idd - medical therapy with hep gtt, ASA 81, lopressor  50mg  bid, crestor 20, nitro gtt written for but has not required. Consider ACE/ARB after revasc  - disucssion with patient between interventional cards and CT surgery about best revasc options, from CT surg notes plans for traditional CABG this admission. - no current chest pains  2. HTN -at goal  3. Hyperlipidemia - lipid panel pending  4. Pulmonary fibrosis    For questions or updates, please contact [facility] Please consult [email or web address] for contact info under        Signed, , MD  10/08/2020, 7:14 AM

## 2020-10-08 NOTE — Progress Notes (Signed)
ANTICOAGULATION CONSULT NOTE  Pharmacy Consult for Heparin Indication: chest pain/ACS  No Known Allergies  Patient Measurements: Height: 6' (182.9 cm) Weight: 109.3 kg (240 lb 14.4 oz) IBW/kg (Calculated) : 77.6 Heparin Dosing Weight: 101.2 kg  Vital Signs: Temp: 98.1 F (36.7 C) (08/27 1704) Temp Source: Oral (08/27 1704) BP: 143/77 (08/27 1704) Pulse Rate: 71 (08/27 1704)  Labs: Recent Labs    10/07/20 0740 10/07/20 0856 10/07/20 0958 10/07/20 1411 10/07/20 2143 10/08/20 0711 10/08/20 1631  HGB  --  15.4  --   --   --  15.1  --   HCT  --  44.2  --   --   --  44.5  --   PLT  --  177  --   --   --  181  --   LABPROT  --   --   --  13.6  --   --   --   INR  --   --   --  1.0  --   --   --   HEPARINUNFRC  --   --   --   --  <0.10* 0.29* 0.31  CREATININE 1.13  --   --   --   --  1.18  --   TROPONINIHS 16  --  15  --   --   --   --     Estimated Creatinine Clearance: 71.2 mL/min (by C-G formula based on SCr of 1.18 mg/dL).  Assessment: 37 yom with history of multivessel coronary artery disease pending OP hybrid CABG/PCI 10/24/20, idiopathic pulmonary fibrosis, HTN, HLD, OSA compliant with CPAP. Patient presenting with chest pain. Heparin per pharmacy consult placed for unstable angina.  Repeat heparin level therapeutic at 0.31 - will increase rate slightly to keep in therapeutic range.  Goal of Therapy:  Heparin level 0.3-0.7 units/ml Monitor platelets by anticoagulation protocol: Yes   Plan:  Increase heparin infusion to 1900 units/hr Daily heparin level and CBC  Fredonia Highland, PharmD, [place], [facility] Clinical Pharmacist [phone] Please check AMION for all [facility] numbers 10/08/2020

## 2020-10-09 ENCOUNTER — Other Ambulatory Visit: Payer: Self-pay | Admitting: *Deleted

## 2020-10-09 ENCOUNTER — Other Ambulatory Visit: Payer: Self-pay

## 2020-10-09 DIAGNOSIS — I200 Unstable angina: Secondary | ICD-10-CM | POA: Diagnosis not present

## 2020-10-09 LAB — CBC
HCT: 42.2 % (ref 39.0–52.0)
Hemoglobin: 14.3 g/dL (ref 13.0–17.0)
MCH: 30 pg (ref 26.0–34.0)
MCHC: 33.9 g/dL (ref 30.0–36.0)
MCV: 88.7 fL (ref 80.0–100.0)
Platelets: 171 10*3/uL (ref 150–400)
RBC: 4.76 MIL/uL (ref 4.22–5.81)
RDW: 12.8 % (ref 11.5–15.5)
WBC: 7.4 10*3/uL (ref 4.0–10.5)
nRBC: 0 % (ref 0.0–0.2)

## 2020-10-09 LAB — LIPID PANEL
Cholesterol: 84 mg/dL (ref 0–200)
HDL: 32 mg/dL — ABNORMAL LOW (ref >40)
LDL Cholesterol: 29 mg/dL (ref 0–99)
Total CHOL/HDL Ratio: 2.6 RATIO
Triglycerides: 117 mg/dL (ref <150)
VLDL: 23 mg/dL (ref 0–40)

## 2020-10-09 LAB — HEPARIN LEVEL (UNFRACTIONATED): Heparin Unfractionated: 0.53 IU/mL (ref 0.30–0.70)

## 2020-10-09 MED ORDER — METOPROLOL TARTRATE 25 MG PO TABS
25.0000 mg | ORAL_TABLET | Freq: Two times a day (BID) | ORAL | Status: DC
  Administered 2020-10-09 – 2020-10-11 (×5): 25 mg via ORAL
  Filled 2020-10-09 (×5): qty 1

## 2020-10-09 NOTE — Progress Notes (Signed)
Progress Note  Patient Name: Phillip Terry Date of Encounter: 10/09/2020  [facility] Cardiologist: Norman Herrlich, MD   Subjective   No complaints  Inpatient Medications    Scheduled Meds:  amLODipine  10 mg Oral QHS   aspirin EC  81 mg Oral QHS   febuxostat  40 mg Oral QHS   fenofibrate  160 mg Oral QHS   metoprolol tartrate  50 mg Oral BID   omega-3 acid ethyl esters  2 g Oral BID   rosuvastatin  20 mg Oral QHS   sodium chloride flush  3 mL Intravenous Q12H   Continuous Infusions:  sodium chloride     heparin 1,900 Units/hr (10/08/20 1858)   nitroGLYCERIN     PRN Meds: sodium chloride, acetaminophen, nitroGLYCERIN, ondansetron (ZOFRAN) IV, sodium chloride flush   Vital Signs    Vitals:   10/08/20 1200 10/08/20 1704 10/08/20 1956 10/09/20 0426  BP: (!) 147/74 (!) 143/77 (!) 148/86 116/70  Pulse: 61 71 71 (!) 49  Resp: 18 18 18 18   Temp: 97.6 F (36.4 C) 98.1 F (36.7 C) 98.4 F (36.9 C) 98 F (36.7 C)  TempSrc: Oral Oral Oral Oral  SpO2: 99% 98% 95% 99%  Weight:    108.7 kg  Height:        Intake/Output Summary (Last 24 hours) at 10/09/2020 0719 Last data filed at 10/09/2020 0432 Gross per 24 hour  Intake 963 ml  Output 2200 ml  Net -1237 ml   Last 3 Weights 10/09/2020 10/08/2020 10/07/2020  Weight (lbs) 239 lb 11.2 oz 240 lb 14.4 oz 242 lb 4.6 oz  Weight (kg) 108.727 kg 109.272 kg 109.9 kg      Telemetry    SR and sinus brady - Personally Reviewed  ECG    N/a - Personally Reviewed  Physical Exam   GEN: No acute distress.   Neck: No JVD Cardiac: RRR, no murmurs, rubs, or gallops.  Respiratory: Clear to auscultation bilaterally. GI: Soft, nontender, non-distended  MS: No edema; No deformity. Neuro:  Nonfocal  Psych: Normal affect   Labs    High Sensitivity Troponin:   Recent Labs  Lab 10/07/20 0740 10/07/20 0958  TROPONINIHS 16 15      Chemistry Recent Labs  Lab 10/07/20 0740 10/08/20 0711  NA 137 137  K 4.2 4.5  CL 106 106   CO2 23 24  GLUCOSE 121* 117*  BUN 18 16  CREATININE 1.13 1.18  CALCIUM 9.5 9.3  PROT 6.8  --   ALBUMIN 3.6  --   AST 26  --   ALT 28  --   ALKPHOS 67  --   BILITOT 0.9  --   GFRNONAA >60 >60  ANIONGAP 8 7     Hematology Recent Labs  Lab 10/07/20 0856 10/08/20 0711 10/09/20 0325  WBC 8.7 7.0 7.4  RBC 5.09 5.06 4.76  HGB 15.4 15.1 14.3  HCT 44.2 44.5 42.2  MCV 86.8 87.9 88.7  MCH 30.3 29.8 30.0  MCHC 34.8 33.9 33.9  RDW 12.7 12.8 12.8  PLT 177 181 171    BNPNo results for input(s): BNP, PROBNP in the last 168 hours.   DDimer No results for input(s): DDIMER in the last 168 hours.   Radiology    DG Chest Portable 1 View  Result Date: 10/07/2020 CLINICAL DATA:  Chest pain EXAM: PORTABLE CHEST 1 VIEW COMPARISON:  Chest radiograph 10/27/2019 FINDINGS: The heart is at the upper limits of normal for size. The  mediastinal contours are normal. There is no focal consolidation or pulmonary edema. There is no pleural effusion or pneumothorax. Coarsened interstitial markings are again seen, unchanged. There is no acute osseous abnormality. IMPRESSION: No radiographic evidence of acute cardiopulmonary process. Electronically Signed   By: Lesia Hausen M.D.   On: 10/07/2020 08:26   ECHOCARDIOGRAM COMPLETE  Result Date: 10/07/2020    ECHOCARDIOGRAM REPORT   Patient Name:   Phillip Terry Date of Exam: 10/07/2020 Medical Rec #:  [id number]  Height:       72.0 in Accession #:    [id number] Weight:       244.9 lb Date of Birth:  04/27/1945  BSA:          2.322 m Patient Age:    75 years   BP:           136/77 mmHg Patient Gender: M          HR:           65 bpm. Exam Location:  Inpatient Procedure: 2D Echo Indications:    unstable angina  History:        Patient has prior history of Echocardiogram examinations, most                 recent 08/25/2019. CAD; Risk Factors:Hypertension, Dyslipidemia                 and Sleep Apnea.  Sonographer:    Delcie Roch RDCS Referring Phys: 99 DAYNA N DUNN   Sonographer Comments: Suboptimal subcostal window. IMPRESSIONS  1. Left ventricular ejection fraction, by estimation, is 55 to 60%. The left ventricle has normal function. Left ventricular endocardial border not optimally defined to evaluate regional wall motion, but appears grossly normal. There is mild left ventricular hypertrophy. Left ventricular diastolic parameters are consistent with Grade I diastolic dysfunction (impaired relaxation).  2. Right ventricular systolic function is normal. The right ventricular size is normal.  3. The mitral valve is normal in structure. Trivial mitral valve regurgitation. No evidence of mitral stenosis.  4. The aortic valve is grossly normal. There is mild calcification of the aortic valve. Aortic valve regurgitation is not visualized. No aortic stenosis is present. FINDINGS  Left Ventricle: Left ventricular ejection fraction, by estimation, is 55 to 60%. The left ventricle has normal function. Left ventricular endocardial border not optimally defined to evaluate regional wall motion. The left ventricular internal cavity size was normal in size. There is mild left ventricular hypertrophy. Left ventricular diastolic parameters are consistent with Grade I diastolic dysfunction (impaired relaxation). Right Ventricle: The right ventricular size is normal. No increase in right ventricular wall thickness. Right ventricular systolic function is normal. Left Atrium: Left atrial size was normal in size. Right Atrium: Right atrial size was normal in size. Pericardium: There is no evidence of pericardial effusion. Presence of pericardial fat pad. Mitral Valve: The mitral valve is normal in structure. Trivial mitral valve regurgitation. No evidence of mitral valve stenosis. Tricuspid Valve: The tricuspid valve is normal in structure. Tricuspid valve regurgitation is trivial. No evidence of tricuspid stenosis. Aortic Valve: The aortic valve is grossly normal. There is mild calcification of  the aortic valve. Aortic valve regurgitation is not visualized. No aortic stenosis is present. Pulmonic Valve: The pulmonic valve was normal in structure. Pulmonic valve regurgitation is trivial. No evidence of pulmonic stenosis. Aorta: The aortic root is normal in size and structure. Venous: The inferior vena cava was not well visualized. IAS/Shunts: The interatrial septum  was not well visualized.  LEFT VENTRICLE PLAX 2D LVIDd:         4.40 cm  Diastology LVIDs:         2.90 cm  LV e' medial:    6.20 cm/s LV PW:         1.10 cm  LV E/e' medial:  11.2 LV IVS:        1.10 cm  LV e' lateral:   11.30 cm/s LVOT diam:     2.20 cm  LV E/e' lateral: 6.2 LV SV:         102 LV SV Index:   44 LVOT Area:     3.80 cm  RIGHT VENTRICLE RV S prime:     10.90 cm/s TAPSE (M-mode): 2.0 cm LEFT ATRIUM             Index       RIGHT ATRIUM           Index LA diam:        4.00 cm 1.72 cm/m  RA Area:     11.70 cm LA Vol (A2C):   53.0 ml 22.82 ml/m RA Volume:   23.70 ml  10.21 ml/m LA Vol (A4C):   60.1 ml 25.88 ml/m LA Biplane Vol: 60.0 ml 25.84 ml/m  AORTIC VALVE LVOT Vmax:   121.00 cm/s LVOT Vmean:  73.300 cm/s LVOT VTI:    0.268 m  AORTA Ao Root diam: 3.70 cm Ao Asc diam:  3.50 cm MITRAL VALVE               TRICUSPID VALVE MV Area (PHT): 2.69 cm    TR Peak grad:   26.4 mmHg MV Decel Time: 282 msec    TR Vmax:        257.00 cm/s MV E velocity: 69.70 cm/s MV A velocity: 79.20 cm/s  SHUNTS MV E/A ratio:  0.88        Systemic VTI:  0.27 m                            Systemic Diam: 2.20 cm Phillip Brass MD Electronically signed by Phillip Brass MD Signature Date/Time: 10/07/2020/5:43:49 PM    Final     Cardiac Studies    Patient Profile     Phillip Terry is a 75 y.o. Phillip with recently diagnosed multivessel coronary artery disease pending OP hybrid CABG/PCI 10/24/20, idiopathic pulmonary fibrosis, HTN, HLD, OSA compliant with CPAP who is being seen 10/07/2020 for the evaluation of chest pain.  Assessment & Plan     1.CAD/Unstable angina - 09/2020 cath: prox LAD 90%, mid LAD 75%, D2 80%, LCX distal 50%, prox RCA to mid 60%, RPAV 50% - 09/2020 echo LVEF 55-60%, grade Idd - medical therapy with hep gtt, ASA 81, lopressor 50mg  bid, crestor 20, nitro gtt written for but has not required. Consider ACE/ARB after revasc   - disucssion with patient between interventional cards and CT surgery about best revasc options, from CT surg notes plans for traditional CABG this admission. -continue medical therapy over the weekend. No current symptoms, await to hear CABG timing from CT surgery   2. HTN -at goal   3. Hyperlipidemia - lipid panel pending   4. Pulmonary fibrosis  5. Sinus bradycardia - lower lopressor to 25 mg bid  For questions or updates, please contact [facility] Please consult [email or web address] for contact info under        Signed,  Dina RichBranch, Kymia Simi, MD  10/09/2020, 7:19 AM

## 2020-10-09 NOTE — Plan of Care (Signed)
  Problem: Education: Goal: Knowledge of General Education information will improve Description: Including pain rating scale, medication(s)/side effects and non-pharmacologic comfort measures Outcome: Progressing   Problem: Health Behavior/Discharge Planning: Goal: Ability to manage health-related needs will improve 10/09/2020 0158 by Antonieta Pert, RN Outcome: Progressing 10/08/2020 1935 by Antonieta Pert, RN Outcome: Progressing   Problem: Clinical Measurements: Goal: Ability to maintain clinical measurements within normal limits will improve Outcome: Progressing Goal: Will remain free from infection 10/09/2020 0158 by Antonieta Pert, RN Outcome: Progressing 10/08/2020 1935 by Antonieta Pert, RN Outcome: Progressing Goal: Respiratory complications will improve Outcome: Not Applicable

## 2020-10-09 NOTE — Progress Notes (Addendum)
ANTICOAGULATION CONSULT NOTE  Pharmacy Consult for Heparin Indication: chest pain/ACS  No Known Allergies  Patient Measurements: Height: 6' (182.9 cm) Weight: 108.7 kg (239 lb 11.2 oz) IBW/kg (Calculated) : 77.6 Heparin Dosing Weight: 101.2 kg  Vital Signs: Temp: 97.7 F (36.5 C) (08/28 0815) Temp Source: Oral (08/28 0815) BP: 136/80 (08/28 0815) Pulse Rate: 67 (08/28 0815)  Labs: Recent Labs     0000 10/07/20 0740 10/07/20 0856 10/07/20 0958 10/07/20 1411 10/07/20 2143 10/08/20 0711 10/08/20 1631 10/09/20 0325  HGB   < >  --  15.4  --   --   --  15.1  --  14.3  HCT  --   --  44.2  --   --   --  44.5  --  42.2  PLT  --   --  177  --   --   --  181  --  171  LABPROT  --   --   --   --  13.6  --   --   --   --   INR  --   --   --   --  1.0  --   --   --   --   HEPARINUNFRC  --   --   --   --   --    < > 0.29* 0.31 0.53  CREATININE  --  1.13  --   --   --   --  1.18  --   --   TROPONINIHS  --  16  --  15  --   --   --   --   --    < > = values in this interval not displayed.    Estimated Creatinine Clearance: 71 mL/min (by C-G formula based on SCr of 1.18 mg/dL).  Assessment: 43 yom with history of multivessel coronary artery disease pending OP hybrid CABG/PCI 10/24/20, idiopathic pulmonary fibrosis, HTN, HLD, OSA compliant with CPAP. Patient presenting with chest pain. Heparin per pharmacy consult placed for unstable angina.  Heparin level returned in therapeutic range at 0.53. Will continue heparin drip rate.  No complication with infusion nor bleeding per chart review. H/H and platelets are stable.  Goal of Therapy:  Heparin level 0.3-0.7 units/ml Monitor platelets by anticoagulation protocol: Yes   Plan:  Continue heparin infusion to 1900 units/hr Check daily heparin level/CBC  Thank you for allowing pharmacy to participate in this patient's care.  Enos Fling, PharmD PGY1 Pharmacy Resident 10/09/2020 9:22 AM Check [email or web address] for unit specific pharmacy  number

## 2020-10-10 ENCOUNTER — Inpatient Hospital Stay (HOSPITAL_COMMUNITY): Payer: Medicare PPO

## 2020-10-10 DIAGNOSIS — E785 Hyperlipidemia, unspecified: Secondary | ICD-10-CM | POA: Diagnosis not present

## 2020-10-10 DIAGNOSIS — G4733 Obstructive sleep apnea (adult) (pediatric): Secondary | ICD-10-CM

## 2020-10-10 DIAGNOSIS — J8410 Pulmonary fibrosis, unspecified: Secondary | ICD-10-CM

## 2020-10-10 DIAGNOSIS — Z9989 Dependence on other enabling machines and devices: Secondary | ICD-10-CM

## 2020-10-10 DIAGNOSIS — I10 Essential (primary) hypertension: Secondary | ICD-10-CM | POA: Diagnosis not present

## 2020-10-10 DIAGNOSIS — Z01810 Encounter for preprocedural cardiovascular examination: Secondary | ICD-10-CM | POA: Diagnosis not present

## 2020-10-10 DIAGNOSIS — I200 Unstable angina: Secondary | ICD-10-CM | POA: Diagnosis not present

## 2020-10-10 DIAGNOSIS — I25110 Atherosclerotic heart disease of native coronary artery with unstable angina pectoris: Principal | ICD-10-CM

## 2020-10-10 LAB — CBC
HCT: 41.5 % (ref 39.0–52.0)
Hemoglobin: 14.4 g/dL (ref 13.0–17.0)
MCH: 30.1 pg (ref 26.0–34.0)
MCHC: 34.7 g/dL (ref 30.0–36.0)
MCV: 86.6 fL (ref 80.0–100.0)
Platelets: 171 10*3/uL (ref 150–400)
RBC: 4.79 MIL/uL (ref 4.22–5.81)
RDW: 12.5 % (ref 11.5–15.5)
WBC: 7.3 10*3/uL (ref 4.0–10.5)
nRBC: 0 % (ref 0.0–0.2)

## 2020-10-10 LAB — ABO/RH: ABO/RH(D): O POS

## 2020-10-10 LAB — HEPARIN LEVEL (UNFRACTIONATED): Heparin Unfractionated: 0.51 IU/mL (ref 0.30–0.70)

## 2020-10-10 LAB — PREPARE RBC (CROSSMATCH)

## 2020-10-10 MED ORDER — MILRINONE LACTATE IN DEXTROSE 20-5 MG/100ML-% IV SOLN
0.3000 ug/kg/min | INTRAVENOUS | Status: DC
  Filled 2020-10-10: qty 100

## 2020-10-10 MED ORDER — PHENYLEPHRINE HCL-NACL 20-0.9 MG/250ML-% IV SOLN
30.0000 ug/min | INTRAVENOUS | Status: AC
  Administered 2020-10-11: 35 ug/min via INTRAVENOUS
  Filled 2020-10-10: qty 250

## 2020-10-10 MED ORDER — NOREPINEPHRINE 4 MG/250ML-% IV SOLN
0.0000 ug/min | INTRAVENOUS | Status: DC
  Filled 2020-10-10: qty 250

## 2020-10-10 MED ORDER — PLASMA-LYTE A IV SOLN
INTRAVENOUS | Status: DC
  Filled 2020-10-10: qty 5

## 2020-10-10 MED ORDER — CHLORHEXIDINE GLUCONATE CLOTH 2 % EX PADS
6.0000 | MEDICATED_PAD | Freq: Once | CUTANEOUS | Status: AC
  Administered 2020-10-10: 6 via TOPICAL

## 2020-10-10 MED ORDER — DEXMEDETOMIDINE HCL IN NACL 400 MCG/100ML IV SOLN
0.1000 ug/kg/h | INTRAVENOUS | Status: AC
  Administered 2020-10-11: .5 ug/kg/h via INTRAVENOUS
  Filled 2020-10-10: qty 100

## 2020-10-10 MED ORDER — VANCOMYCIN HCL 1500 MG/300ML IV SOLN
1500.0000 mg | INTRAVENOUS | Status: AC
  Administered 2020-10-11: 1500 mg via INTRAVENOUS
  Filled 2020-10-10: qty 300

## 2020-10-10 MED ORDER — SODIUM CHLORIDE 0.9 % IV SOLN
INTRAVENOUS | Status: DC
  Filled 2020-10-10: qty 30

## 2020-10-10 MED ORDER — POTASSIUM CHLORIDE 2 MEQ/ML IV SOLN
80.0000 meq | INTRAVENOUS | Status: DC
  Filled 2020-10-10: qty 40

## 2020-10-10 MED ORDER — TRANEXAMIC ACID 1000 MG/10ML IV SOLN
1.5000 mg/kg/h | INTRAVENOUS | Status: AC
  Administered 2020-10-11: 1.5 mg/kg/h via INTRAVENOUS
  Filled 2020-10-10: qty 25

## 2020-10-10 MED ORDER — CEFAZOLIN SODIUM-DEXTROSE 2-4 GM/100ML-% IV SOLN
2.0000 g | INTRAVENOUS | Status: AC
  Administered 2020-10-11 (×2): 2 g via INTRAVENOUS
  Filled 2020-10-10: qty 100

## 2020-10-10 MED ORDER — EPINEPHRINE HCL 5 MG/250ML IV SOLN IN NS
0.0000 ug/min | INTRAVENOUS | Status: DC
  Filled 2020-10-10: qty 250

## 2020-10-10 MED ORDER — NITROGLYCERIN IN D5W 200-5 MCG/ML-% IV SOLN
0.0000 ug/min | INTRAVENOUS | Status: DC
  Administered 2020-10-10: 5 ug/min via INTRAVENOUS
  Filled 2020-10-10: qty 250

## 2020-10-10 MED ORDER — TRANEXAMIC ACID (OHS) BOLUS VIA INFUSION
15.0000 mg/kg | INTRAVENOUS | Status: AC
  Administered 2020-10-11: 1641 mg via INTRAVENOUS
  Filled 2020-10-10: qty 1641

## 2020-10-10 MED ORDER — MANNITOL 20 % IV SOLN
Freq: Once | INTRAVENOUS | Status: DC
  Filled 2020-10-10: qty 13

## 2020-10-10 MED ORDER — BISACODYL 5 MG PO TBEC
5.0000 mg | DELAYED_RELEASE_TABLET | Freq: Once | ORAL | Status: DC
  Filled 2020-10-10: qty 1

## 2020-10-10 MED ORDER — NITROGLYCERIN IN D5W 200-5 MCG/ML-% IV SOLN
2.0000 ug/min | INTRAVENOUS | Status: DC
Start: 2020-10-11 — End: 2020-10-11
  Filled 2020-10-10: qty 250

## 2020-10-10 MED ORDER — MANNITOL 20 % IV SOLN: INTRAVENOUS | Status: DC

## 2020-10-10 MED ORDER — INSULIN REGULAR(HUMAN) IN NACL 100-0.9 UT/100ML-% IV SOLN
INTRAVENOUS | Status: AC
  Administered 2020-10-11: 1.3 [IU]/h via INTRAVENOUS
  Filled 2020-10-10: qty 100

## 2020-10-10 MED ORDER — CEFAZOLIN SODIUM-DEXTROSE 2-4 GM/100ML-% IV SOLN
2.0000 g | INTRAVENOUS | Status: DC
  Filled 2020-10-10: qty 100

## 2020-10-10 MED ORDER — TRANEXAMIC ACID (OHS) PUMP PRIME SOLUTION
2.0000 mg/kg | INTRAVENOUS | Status: DC
  Filled 2020-10-10: qty 2.19

## 2020-10-10 NOTE — Progress Notes (Signed)
Pt having CP this am therefore will not ambulate. Helped pt relax after IV starts. Discussed IS (2500+ ml), sternal precautions, mobility post op and d/c planning. Pt receptive. Gave him OHS booklet and careguides. Also gave preop video to view.  [id number] Ethelda Chick CES, ACSM 11:56 AM 10/10/2020

## 2020-10-10 NOTE — Progress Notes (Addendum)
Progress Note  Patient Name: Phillip Terry Date of Encounter: 10/10/2020  [facility] Cardiologist: Norman Herrlich, MD   Subjective   Patient developed chest pain with radiation into bilateral arms just prior to my entering the room. Given 1 SL nitro by RN with significant improvement in symptoms.   Inpatient Medications    Scheduled Meds:  amLODipine  10 mg Oral QHS   aspirin EC  81 mg Oral QHS   [START ON 10/11/2020] epinephrine  0-10 mcg/min Intravenous To OR   febuxostat  40 mg Oral QHS   fenofibrate  160 mg Oral QHS   [START ON 10/11/2020] heparin-papaverine-plasmalyte irrigation   Irrigation To OR   [START ON 10/11/2020] insulin   Intravenous To OR   [facility] Blood Cardioplegia vial (lidocaine/magnesium/mannitol 0.26g-4g-6.4g)   Intracoronary Once   metoprolol tartrate  25 mg Oral BID   omega-3 acid ethyl esters  2 g Oral BID   [START ON 10/11/2020] phenylephrine  30-200 mcg/min Intravenous To OR   [START ON 10/11/2020] potassium chloride  80 mEq Other To OR   rosuvastatin  20 mg Oral QHS   sodium chloride flush  3 mL Intravenous Q12H   [START ON 10/11/2020] tranexamic acid  15 mg/kg Intravenous To OR   [START ON 10/11/2020] tranexamic acid  2 mg/kg Intracatheter To OR   Continuous Infusions:  sodium chloride     [START ON 10/11/2020]  ceFAZolin (ANCEF) IV     [START ON 10/11/2020]  ceFAZolin (ANCEF) IV     [START ON 10/11/2020] dexmedetomidine     [START ON 10/11/2020] heparin 30,000 units/NS 1000 mL solution for CELLSAVER     heparin 1,900 Units/hr (10/09/20 2233)   [START ON 10/11/2020] milrinone     nitroGLYCERIN     [START ON 10/11/2020] nitroGLYCERIN     [START ON 10/11/2020] norepinephrine     [START ON 10/11/2020] tranexamic acid (CYKLOKAPRON) infusion ([facility])     [START ON 10/11/2020] vancomycin     PRN Meds: sodium chloride, acetaminophen, nitroGLYCERIN, ondansetron (ZOFRAN) IV, sodium chloride flush   Vital Signs    Vitals:   10/09/20 0426 10/09/20 0815 10/09/20  1946 10/10/20 0324  BP: 116/70 136/80 (!) 147/77 119/69  Pulse: (!) 49 67 75 (!) 57  Resp: 18 16 17 18   Temp: 98 F (36.7 C) 97.7 F (36.5 C) 99 F (37.2 C) 98 F (36.7 C)  TempSrc: Oral Oral Oral Oral  SpO2: 99% 97% 96% 97%  Weight: 108.7 kg   109.4 kg  Height:        Intake/Output Summary (Last 24 hours) at 10/10/2020 0932 Last data filed at 10/10/2020 0326 Gross per 24 hour  Intake 600 ml  Output 1550 ml  Net -950 ml   Last 3 Weights 10/10/2020 10/09/2020 10/08/2020  Weight (lbs) 241 lb 1.6 oz 239 lb 11.2 oz 240 lb 14.4 oz  Weight (kg) 109.362 kg 108.727 kg 109.272 kg      Telemetry    SR-->SB - Personally Reviewed  ECG    SR, no acute ST/T wave changes - Personally Reviewed  Physical Exam   GEN: No acute distress.   Neck: No JVD Cardiac: RRR, no murmurs, rubs, or gallops. Normal S1 & S2 Respiratory: Clear to auscultation bilaterally. Non-labored, good air movement GI: Soft, nontender, non-distended  MS: No edema; No deformity. Neuro:  Nonfocal  Psych: Normal affect   Labs    High Sensitivity Troponin:   Recent Labs  Lab 10/07/20 0740 10/07/20 0958  TROPONINIHS  16 15      Chemistry Recent Labs  Lab 10/07/20 0740 10/08/20 0711  NA 137 137  K 4.2 4.5  CL 106 106  CO2 23 24  GLUCOSE 121* 117*  BUN 18 16  CREATININE 1.13 1.18  CALCIUM 9.5 9.3  PROT 6.8  --   ALBUMIN 3.6  --   AST 26  --   ALT 28  --   ALKPHOS 67  --   BILITOT 0.9  --   GFRNONAA >60 >60  ANIONGAP 8 7     Hematology Recent Labs  Lab 10/08/20 0711 10/09/20 0325 10/10/20 0338  WBC 7.0 7.4 7.3  RBC 5.06 4.76 4.79  HGB 15.1 14.3 14.4  HCT 44.5 42.2 41.5  MCV 87.9 88.7 86.6  MCH 29.8 30.0 30.1  MCHC 33.9 33.9 34.7  RDW 12.8 12.8 12.5  PLT 181 171 171    BNPNo results for input(s): BNP, PROBNP in the last 168 hours.   DDimer No results for input(s): DDIMER in the last 168 hours.   Radiology    No results found.  Cardiac Studies   Echo: 10/07/20: EF 55-60%.  Normal LV Fxn. NO RWMA. GR 1 DD. Mild AoV calcification (sclerosis) -no AS. Normal RA&LA. Normal valves.  Cath 09/23/2020   Patient Profile     75 y.o. male with recently diagnosed multivessel coronary artery disease pending OP hybrid CABG/PCI 10/24/20, idiopathic pulmonary fibrosis, HTN, HLD, OSA compliant with CPAP who is being seen 10/07/2020 for the evaluation of chest pain.  Assessment & Plan    Unstable angina: initially planned for outpatient CABG, but developed recurrent symptoms and admitted for temporizing management of angina. Developed recurrent angina this morning, which improved with SL nitro. Echo noted above with EF of 55-60%, G1DD, no rWMA. -- > Planned for CABG (with Hybrid PCI of RCA), timing TDB. Seen by Dr. Cliffton Asters. -- continue on IV heparin, ASA, metoprolol, crestor. Already on max dose Amlodipine (CCB). Start IV nitro with recurrent symptoms --> IF CP continues use Narcotics & consider adding Ranexa pending CABG  HTN: stable with current therapy -- amlodipine, metoprolol  HLD: LDL 29 -- on statin  Sinus bradycardia: metoprolol was reduced to 25mg  BID yesterday, HR stable  Pulmonary Fibrosis  For questions or updates, please contact [facility] Please consult [email or web address] for contact info under        Signed, , NP  10/10/2020, 9:32 AM     ATTENDING ATTESTATION  I have seen, examined and evaluated the patient this AM along with 10/12/2020, NP-C.  After reviewing all the available data and chart, we discussed the patients laboratory, study & physical findings as well as symptoms in detail. I agree with her findings, examination as well as impression recommendations as per our discussion.    Attending adjustments noted in italics.   Continue supportive care pending CABG -- if recurrent CP - would add IV Heparin to IV NTG - CABG pending tomorrow.   On stable meds otherwise - but may consider converting to Carvedilol from Metoprolol  for better BP control vs. Adding ARB post-CABG/.   Laverda Page, M.D., M.S. Interventional Cardiologist   Pager # [phone] Phone # [phone] [address] [place], [place]

## 2020-10-10 NOTE — Progress Notes (Signed)
ANTICOAGULATION CONSULT NOTE - Follow Up Consult  Pharmacy Consult for Heparin Indication: chest pain/ACS  No Known Allergies  Patient Measurements: Height: 6' (182.9 cm) Weight: 109.4 kg (241 lb 1.6 oz) IBW/kg (Calculated) : 77.6 Heparin Dosing Weight: 101 kg  Vital Signs: Temp: 97.6 F (36.4 C) (08/29 0932) Temp Source: Oral (08/29 0932) BP: 144/76 (08/29 0932) Pulse Rate: 62 (08/29 0932)  Labs: Recent Labs    10/07/20 0958 10/07/20 1411 10/07/20 2143 10/08/20 0711 10/08/20 1631 10/09/20 0325 10/10/20 0338  HGB  --   --    < > 15.1  --  14.3 14.4  HCT  --   --   --  44.5  --  42.2 41.5  PLT  --   --   --  181  --  171 171  LABPROT  --  13.6  --   --   --   --   --   INR  --  1.0  --   --   --   --   --   HEPARINUNFRC  --   --    < > 0.29* 0.31 0.53 0.51  CREATININE  --   --   --  1.18  --   --   --   TROPONINIHS 15  --   --   --   --   --   --    < > = values in this interval not displayed.    Estimated Creatinine Clearance: 71.2 mL/min (by C-G formula based on SCr of 1.18 mg/dL).  Assessment: 58 yom with history of multivessel coronary artery disease pending OP hybrid CABG/PCI 10/24/20, idiopathic pulmonary fibrosis, HTN, HLD, OSA compliant with CPAP. Presented with chest pain 10/07/20. Pharmacy consulted for Heparin dosing for unstable angina. Now planning traditional CABG.    Heparin level remains therapeutic (0.51) on 1900 units/hr.    Goal of Therapy:  Heparin level 0.3-0.7 units/ml Monitor platelets by anticoagulation protocol: Yes   Plan:  Continue heparin drip at 1900 units/hr. Daily heparin level and CBC while on heparin. On OR scheduled for CABG 8/30.  Dennie Fetters, RPh 10/10/2020,9:46 AM

## 2020-10-10 NOTE — Progress Notes (Signed)
Pre-CABG exam has been completed.  Results can be found under chart review under CV PROC. 10/10/2020 5:27 PM Tiyona Desouza RVT, RDMS

## 2020-10-10 NOTE — Progress Notes (Signed)
Patient has home CPAP for use and will self place when ready.  

## 2020-10-10 NOTE — Progress Notes (Signed)
     [address]       [name] [address]             [phone]       Chest pain earlier today, relieved with nitro  Vitals:   10/10/20 0947 10/10/20 0950  BP: (!) 162/76 135/74  Pulse: 70 73  Resp: 20 20  Temp:    SpO2:     Alert NAD Sinus EWOB   75 yo male with 3V CAD  OR tomorrow for CABG  Phillip Terry O [name]

## 2020-10-11 ENCOUNTER — Inpatient Hospital Stay (HOSPITAL_COMMUNITY): Payer: Medicare PPO

## 2020-10-11 ENCOUNTER — Inpatient Hospital Stay (HOSPITAL_COMMUNITY): Payer: Medicare PPO | Admitting: Certified Registered Nurse Anesthetist

## 2020-10-11 ENCOUNTER — Encounter (HOSPITAL_COMMUNITY): Payer: Self-pay | Admitting: Cardiovascular Disease

## 2020-10-11 ENCOUNTER — Inpatient Hospital Stay (HOSPITAL_COMMUNITY)
Admission: EM | Disposition: A | Discharge status: Home / Self Care | Payer: Self-pay | Source: Home / Self Care | Attending: Cardiovascular Disease

## 2020-10-11 DIAGNOSIS — E785 Hyperlipidemia, unspecified: Secondary | ICD-10-CM | POA: Diagnosis not present

## 2020-10-11 DIAGNOSIS — I10 Essential (primary) hypertension: Secondary | ICD-10-CM | POA: Diagnosis not present

## 2020-10-11 DIAGNOSIS — I200 Unstable angina: Secondary | ICD-10-CM | POA: Diagnosis not present

## 2020-10-11 DIAGNOSIS — I25110 Atherosclerotic heart disease of native coronary artery with unstable angina pectoris: Secondary | ICD-10-CM

## 2020-10-11 HISTORY — PX: TEE WITHOUT CARDIOVERSION: SHX5443

## 2020-10-11 HISTORY — PX: CORONARY ARTERY BYPASS GRAFT: SHX141

## 2020-10-11 LAB — POCT I-STAT 7, (LYTES, BLD GAS, ICA,H+H)
Acid-Base Excess: 0 mmol/L (ref 0.0–2.0)
Acid-base deficit: 3 mmol/L — ABNORMAL HIGH (ref 0.0–2.0)
Acid-base deficit: 2 mmol/L (ref 0.0–2.0)
Acid-base deficit: 4 mmol/L — ABNORMAL HIGH (ref 0.0–2.0)
Acid-base deficit: 4 mmol/L — ABNORMAL HIGH (ref 0.0–2.0)
Bicarbonate: 23.3 mmol/L (ref 20.0–28.0)
Bicarbonate: 25.8 mmol/L (ref 20.0–28.0)
Bicarbonate: 22.2 mmol/L (ref 20.0–28.0)
Bicarbonate: 23.1 mmol/L (ref 20.0–28.0)
Bicarbonate: 22.6 mmol/L (ref 20.0–28.0)
Calcium, Ion: 1.31 mmol/L (ref 1.15–1.40)
Calcium, Ion: 1.05 mmol/L — ABNORMAL LOW (ref 1.15–1.40)
Calcium, Ion: 1.08 mmol/L — ABNORMAL LOW (ref 1.15–1.40)
Calcium, Ion: 1.34 mmol/L (ref 1.15–1.40)
Calcium, Ion: 1.28 mmol/L (ref 1.15–1.40)
HCT: 42 % (ref 39.0–52.0)
HCT: 30 % — ABNORMAL LOW (ref 39.0–52.0)
HCT: 33 % — ABNORMAL LOW (ref 39.0–52.0)
HCT: 34 % — ABNORMAL LOW (ref 39.0–52.0)
HCT: 41 % (ref 39.0–52.0)
Hemoglobin: 14.3 g/dL (ref 13.0–17.0)
Hemoglobin: 10.2 g/dL — ABNORMAL LOW (ref 13.0–17.0)
Hemoglobin: 11.2 g/dL — ABNORMAL LOW (ref 13.0–17.0)
Hemoglobin: 11.6 g/dL — ABNORMAL LOW (ref 13.0–17.0)
Hemoglobin: 13.9 g/dL (ref 13.0–17.0)
O2 Saturation: 98 %
O2 Saturation: 100 %
O2 Saturation: 100 %
O2 Saturation: 100 %
O2 Saturation: 93 %
Patient temperature: 36.9
Potassium: 3.9 mmol/L (ref 3.5–5.1)
Potassium: 4.3 mmol/L (ref 3.5–5.1)
Potassium: 5.6 mmol/L — ABNORMAL HIGH (ref 3.5–5.1)
Potassium: 6.5 mmol/L (ref 3.5–5.1)
Potassium: 4.9 mmol/L (ref 3.5–5.1)
Sodium: 141 mmol/L (ref 135–145)
Sodium: 137 mmol/L (ref 135–145)
Sodium: 135 mmol/L (ref 135–145)
Sodium: 138 mmol/L (ref 135–145)
Sodium: 139 mmol/L (ref 135–145)
TCO2: 25 mmol/L (ref 22–32)
TCO2: 27 mmol/L (ref 22–32)
TCO2: 24 mmol/L (ref 22–32)
TCO2: 24 mmol/L (ref 22–32)
TCO2: 24 mmol/L (ref 22–32)
pCO2 arterial: 47.6 mmHg (ref 32.0–48.0)
pCO2 arterial: 46.7 mmHg (ref 32.0–48.0)
pCO2 arterial: 38.7 mmHg (ref 32.0–48.0)
pCO2 arterial: 44.6 mmHg (ref 32.0–48.0)
pCO2 arterial: 44.5 mmHg (ref 32.0–48.0)
pH, Arterial: 7.298 — ABNORMAL LOW (ref 7.350–7.450)
pH, Arterial: 7.35 (ref 7.350–7.450)
pH, Arterial: 7.306 — ABNORMAL LOW (ref 7.350–7.450)
pH, Arterial: 7.383 (ref 7.350–7.450)
pH, Arterial: 7.314 — ABNORMAL LOW (ref 7.350–7.450)
pO2, Arterial: 125 mmHg — ABNORMAL HIGH (ref 83.0–108.0)
pO2, Arterial: 258 mmHg — ABNORMAL HIGH (ref 83.0–108.0)
pO2, Arterial: 268 mmHg — ABNORMAL HIGH (ref 83.0–108.0)
pO2, Arterial: 308 mmHg — ABNORMAL HIGH (ref 83.0–108.0)
pO2, Arterial: 73 mmHg — ABNORMAL LOW (ref 83.0–108.0)

## 2020-10-11 LAB — PROTIME-INR
INR: 1.4 — ABNORMAL HIGH (ref 0.8–1.2)
Prothrombin Time: 16.8 seconds — ABNORMAL HIGH (ref 11.4–15.2)

## 2020-10-11 LAB — HEMOGLOBIN AND HEMATOCRIT, BLOOD
HCT: 32.1 % — ABNORMAL LOW (ref 39.0–52.0)
Hemoglobin: 11.3 g/dL — ABNORMAL LOW (ref 13.0–17.0)

## 2020-10-11 LAB — POCT I-STAT, CHEM 8
BUN: 16 mg/dL (ref 8–23)
BUN: 14 mg/dL (ref 8–23)
BUN: 16 mg/dL (ref 8–23)
BUN: 15 mg/dL (ref 8–23)
Calcium, Ion: 1.29 mmol/L (ref 1.15–1.40)
Calcium, Ion: 1.27 mmol/L (ref 1.15–1.40)
Calcium, Ion: 1.04 mmol/L — ABNORMAL LOW (ref 1.15–1.40)
Calcium, Ion: 1.35 mmol/L (ref 1.15–1.40)
Chloride: 105 mmol/L (ref 98–111)
Chloride: 104 mmol/L (ref 98–111)
Chloride: 106 mmol/L (ref 98–111)
Chloride: 107 mmol/L (ref 98–111)
Creatinine, Ser: 0.8 mg/dL (ref 0.61–1.24)
Creatinine, Ser: 0.8 mg/dL (ref 0.61–1.24)
Creatinine, Ser: 0.9 mg/dL (ref 0.61–1.24)
Creatinine, Ser: 0.8 mg/dL (ref 0.61–1.24)
Glucose, Bld: 102 mg/dL — ABNORMAL HIGH (ref 70–99)
Glucose, Bld: 98 mg/dL (ref 70–99)
Glucose, Bld: 122 mg/dL — ABNORMAL HIGH (ref 70–99)
Glucose, Bld: 160 mg/dL — ABNORMAL HIGH (ref 70–99)
HCT: 41 % (ref 39.0–52.0)
HCT: 39 % (ref 39.0–52.0)
HCT: 31 % — ABNORMAL LOW (ref 39.0–52.0)
HCT: 32 % — ABNORMAL LOW (ref 39.0–52.0)
Hemoglobin: 13.9 g/dL (ref 13.0–17.0)
Hemoglobin: 13.3 g/dL (ref 13.0–17.0)
Hemoglobin: 10.5 g/dL — ABNORMAL LOW (ref 13.0–17.0)
Hemoglobin: 10.9 g/dL — ABNORMAL LOW (ref 13.0–17.0)
Potassium: 3.9 mmol/L (ref 3.5–5.1)
Potassium: 4.2 mmol/L (ref 3.5–5.1)
Potassium: 6.3 mmol/L (ref 3.5–5.1)
Potassium: 5.6 mmol/L — ABNORMAL HIGH (ref 3.5–5.1)
Sodium: 140 mmol/L (ref 135–145)
Sodium: 142 mmol/L (ref 135–145)
Sodium: 133 mmol/L — ABNORMAL LOW (ref 135–145)
Sodium: 137 mmol/L (ref 135–145)
TCO2: 24 mmol/L (ref 22–32)
TCO2: 22 mmol/L (ref 22–32)
TCO2: 24 mmol/L (ref 22–32)
TCO2: 24 mmol/L (ref 22–32)

## 2020-10-11 LAB — CBC
HCT: 39.9 % (ref 39.0–52.0)
HCT: 39.8 % (ref 39.0–52.0)
Hemoglobin: 14 g/dL (ref 13.0–17.0)
Hemoglobin: 13.9 g/dL (ref 13.0–17.0)
MCH: 30.4 pg (ref 26.0–34.0)
MCH: 30.8 pg (ref 26.0–34.0)
MCHC: 35.1 g/dL (ref 30.0–36.0)
MCHC: 34.9 g/dL (ref 30.0–36.0)
MCV: 86.6 fL (ref 80.0–100.0)
MCV: 88.1 fL (ref 80.0–100.0)
Platelets: 166 10*3/uL (ref 150–400)
Platelets: 155 10*3/uL (ref 150–400)
RBC: 4.61 MIL/uL (ref 4.22–5.81)
RBC: 4.52 MIL/uL (ref 4.22–5.81)
RDW: 12.7 % (ref 11.5–15.5)
RDW: 12.7 % (ref 11.5–15.5)
WBC: 8.2 10*3/uL (ref 4.0–10.5)
WBC: 19.3 10*3/uL — ABNORMAL HIGH (ref 4.0–10.5)
nRBC: 0 % (ref 0.0–0.2)
nRBC: 0 % (ref 0.0–0.2)

## 2020-10-11 LAB — APTT: aPTT: 29 seconds (ref 24–36)

## 2020-10-11 LAB — POCT I-STAT EG7
Acid-base deficit: 1 mmol/L (ref 0.0–2.0)
Bicarbonate: 25.6 mmol/L (ref 20.0–28.0)
Calcium, Ion: 1.11 mmol/L — ABNORMAL LOW (ref 1.15–1.40)
HCT: 31 % — ABNORMAL LOW (ref 39.0–52.0)
Hemoglobin: 10.5 g/dL — ABNORMAL LOW (ref 13.0–17.0)
O2 Saturation: 73 %
Potassium: 4.2 mmol/L (ref 3.5–5.1)
Sodium: 138 mmol/L (ref 135–145)
TCO2: 27 mmol/L (ref 22–32)
pCO2, Ven: 50.3 mmHg (ref 44.0–60.0)
pH, Ven: 7.314 (ref 7.250–7.430)
pO2, Ven: 42 mmHg (ref 32.0–45.0)

## 2020-10-11 LAB — GLUCOSE, CAPILLARY
Glucose-Capillary: 129 mg/dL — ABNORMAL HIGH (ref 70–99)
Glucose-Capillary: 94 mg/dL (ref 70–99)
Glucose-Capillary: 127 mg/dL — ABNORMAL HIGH (ref 70–99)
Glucose-Capillary: 137 mg/dL — ABNORMAL HIGH (ref 70–99)
Glucose-Capillary: 128 mg/dL — ABNORMAL HIGH (ref 70–99)

## 2020-10-11 LAB — ECHO INTRAOPERATIVE TEE
Height: 72 in
Weight: 3851.2 [oz_av]

## 2020-10-11 LAB — HEPARIN LEVEL (UNFRACTIONATED): Heparin Unfractionated: 0.39 [IU]/mL (ref 0.30–0.70)

## 2020-10-11 LAB — SURGICAL PCR SCREEN
MRSA, PCR: NEGATIVE
Staphylococcus aureus: NEGATIVE

## 2020-10-11 LAB — PLATELET COUNT: Platelets: 192 10*3/uL (ref 150–400)

## 2020-10-11 SURGERY — CORONARY ARTERY BYPASS GRAFTING (CABG)
Anesthesia: General | Site: Chest

## 2020-10-11 MED ORDER — ONDANSETRON HCL 4 MG/2ML IJ SOLN
4.0000 mg | Freq: Four times a day (QID) | INTRAMUSCULAR | Status: DC | PRN
  Administered 2020-10-11 – 2020-10-13 (×2): 4 mg via INTRAVENOUS
  Filled 2020-10-11 (×2): qty 2

## 2020-10-11 MED ORDER — MIDAZOLAM HCL 2 MG/2ML IJ SOLN
INTRAMUSCULAR | Status: AC
  Filled 2020-10-11: qty 2

## 2020-10-11 MED ORDER — PROTAMINE SULFATE 10 MG/ML IV SOLN
INTRAVENOUS | Status: AC
  Filled 2020-10-11: qty 5

## 2020-10-11 MED ORDER — MIDAZOLAM HCL (PF) 10 MG/2ML IJ SOLN
INTRAMUSCULAR | Status: AC
  Filled 2020-10-11: qty 2

## 2020-10-11 MED ORDER — LIDOCAINE 2% (20 MG/ML) 5 ML SYRINGE
INTRAMUSCULAR | Status: DC | PRN
Start: 2020-10-11 — End: 2020-10-11
  Administered 2020-10-11: 50 mg via INTRAVENOUS
  Administered 2020-10-11: 60 mg via INTRAVENOUS

## 2020-10-11 MED ORDER — SODIUM CHLORIDE 0.9% FLUSH: 10.0000 mL | INTRAVENOUS | Status: DC | PRN

## 2020-10-11 MED ORDER — ACETAMINOPHEN 500 MG PO TABS
1000.0000 mg | ORAL_TABLET | Freq: Four times a day (QID) | ORAL | Status: DC
  Administered 2020-10-12 – 2020-10-15 (×11): 1000 mg via ORAL
  Filled 2020-10-11 (×14): qty 2

## 2020-10-11 MED ORDER — DEXTROSE 50 % IV SOLN: 0.0000 mL | INTRAVENOUS | Status: DC | PRN

## 2020-10-11 MED ORDER — METOPROLOL TARTRATE 12.5 MG HALF TABLET
12.5000 mg | ORAL_TABLET | Freq: Two times a day (BID) | ORAL | Status: DC
  Administered 2020-10-12 (×2): 12.5 mg via ORAL
  Filled 2020-10-11 (×2): qty 1

## 2020-10-11 MED ORDER — ALBUMIN HUMAN 5 % IV SOLN
250.0000 mL | INTRAVENOUS | Status: AC | PRN
  Administered 2020-10-11: 12.5 g via INTRAVENOUS

## 2020-10-11 MED ORDER — SODIUM CHLORIDE 0.9 % IV SOLN: 250.0000 mL | INTRAVENOUS | Status: DC

## 2020-10-11 MED ORDER — 0.9 % SODIUM CHLORIDE (POUR BTL) OPTIME
TOPICAL | Status: DC | PRN
  Administered 2020-10-11: 5000 mL

## 2020-10-11 MED ORDER — TEMAZEPAM 15 MG PO CAPS: 15.0000 mg | ORAL_CAPSULE | Freq: Once | ORAL | Status: DC | PRN

## 2020-10-11 MED ORDER — SODIUM CHLORIDE 0.45 % IV SOLN: INTRAVENOUS | Status: DC | PRN

## 2020-10-11 MED ORDER — SODIUM CHLORIDE 0.9% FLUSH
3.0000 mL | Freq: Two times a day (BID) | INTRAVENOUS | Status: DC
  Administered 2020-10-12 – 2020-10-15 (×6): 3 mL via INTRAVENOUS

## 2020-10-11 MED ORDER — HEPARIN SODIUM (PORCINE) 1000 UNIT/ML IJ SOLN
INTRAMUSCULAR | Status: DC | PRN
  Administered 2020-10-11: 33000 [IU] via INTRAVENOUS

## 2020-10-11 MED ORDER — LACTATED RINGERS IV SOLN: INTRAVENOUS | Status: DC | PRN

## 2020-10-11 MED ORDER — MAGNESIUM SULFATE 4 GM/100ML IV SOLN
4.0000 g | Freq: Once | INTRAVENOUS | Status: AC
  Administered 2020-10-11 (×2): 4 g via INTRAVENOUS
  Filled 2020-10-11: qty 100

## 2020-10-11 MED ORDER — INSULIN REGULAR(HUMAN) IN NACL 100-0.9 UT/100ML-% IV SOLN: INTRAVENOUS | Status: DC

## 2020-10-11 MED ORDER — EPINEPHRINE HCL 5 MG/250ML IV SOLN IN NS
0.0000 ug/min | INTRAVENOUS | Status: DC
Start: 2020-10-11 — End: 2020-10-14

## 2020-10-11 MED ORDER — MORPHINE SULFATE (PF) 2 MG/ML IV SOLN
1.0000 mg | INTRAVENOUS | Status: DC | PRN
  Administered 2020-10-11 – 2020-10-12 (×3): 2 mg via INTRAVENOUS
  Administered 2020-10-13: 4 mg via INTRAVENOUS
  Filled 2020-10-11 (×2): qty 1
  Filled 2020-10-11: qty 2
  Filled 2020-10-11 (×2): qty 1

## 2020-10-11 MED ORDER — NICARDIPINE HCL IN NACL 20-0.86 MG/200ML-% IV SOLN
0.0000 mg/h | INTRAVENOUS | Status: DC
Start: 2020-10-11 — End: 2020-10-14
  Administered 2020-10-11: 1 mg/h via INTRAVENOUS
  Filled 2020-10-11: qty 200

## 2020-10-11 MED ORDER — TRAMADOL HCL 50 MG PO TABS
50.0000 mg | ORAL_TABLET | ORAL | Status: DC | PRN
  Administered 2020-10-12 – 2020-10-14 (×2): 100 mg via ORAL
  Filled 2020-10-11 (×2): qty 2

## 2020-10-11 MED ORDER — CHLORHEXIDINE GLUCONATE CLOTH 2 % EX PADS: 6.0000 | MEDICATED_PAD | Freq: Every day | CUTANEOUS | Status: DC

## 2020-10-11 MED ORDER — FENTANYL CITRATE (PF) 250 MCG/5ML IJ SOLN
INTRAMUSCULAR | Status: DC | PRN
  Administered 2020-10-11: 50 ug via INTRAVENOUS
  Administered 2020-10-11 (×5): 100 ug via INTRAVENOUS
  Administered 2020-10-11: 250 ug via INTRAVENOUS
  Administered 2020-10-11 (×2): 100 ug via INTRAVENOUS

## 2020-10-11 MED ORDER — POTASSIUM CHLORIDE 10 MEQ/50ML IV SOLN
10.0000 meq | INTRAVENOUS | Status: AC
Start: 2020-10-11 — End: 2020-10-11

## 2020-10-11 MED ORDER — METOCLOPRAMIDE HCL 5 MG/ML IJ SOLN
10.0000 mg | Freq: Four times a day (QID) | INTRAMUSCULAR | Status: AC
  Administered 2020-10-11 – 2020-10-12 (×3): 10 mg via INTRAVENOUS
  Filled 2020-10-11: qty 2

## 2020-10-11 MED ORDER — PANTOPRAZOLE SODIUM 40 MG PO TBEC
40.0000 mg | DELAYED_RELEASE_TABLET | Freq: Every day | ORAL | Status: DC
  Administered 2020-10-13 – 2020-10-15 (×3): 40 mg via ORAL
  Filled 2020-10-11 (×3): qty 1

## 2020-10-11 MED ORDER — DEXMEDETOMIDINE HCL IN NACL 400 MCG/100ML IV SOLN
0.0000 ug/kg/h | INTRAVENOUS | Status: DC
  Administered 2020-10-11: 0.7 ug/kg/h via INTRAVENOUS
  Filled 2020-10-11: qty 100

## 2020-10-11 MED ORDER — CHLORHEXIDINE GLUCONATE 0.12% ORAL RINSE (MEDLINE KIT)
15.0000 mL | Freq: Two times a day (BID) | OROMUCOSAL | Status: DC
  Administered 2020-10-11 – 2020-10-12 (×2): 15 mL via OROMUCOSAL

## 2020-10-11 MED ORDER — DOBUTAMINE IN D5W 4-5 MG/ML-% IV SOLN: 2.5000 ug/kg/min | INTRAVENOUS | Status: DC

## 2020-10-11 MED ORDER — BISACODYL 10 MG RE SUPP: 10.0000 mg | Freq: Every day | RECTAL | Status: DC

## 2020-10-11 MED ORDER — LACTATED RINGERS IV SOLN: INTRAVENOUS | Status: DC

## 2020-10-11 MED ORDER — CHLORHEXIDINE GLUCONATE 0.12 % MT SOLN
15.0000 mL | OROMUCOSAL | Status: AC
  Administered 2020-10-11: 15 mL via OROMUCOSAL

## 2020-10-11 MED ORDER — NITROGLYCERIN 0.2 MG/ML ON CALL CATH LAB
INTRAVENOUS | Status: DC | PRN
  Administered 2020-10-11 (×2): 40 ug via INTRAVENOUS

## 2020-10-11 MED ORDER — SODIUM CHLORIDE (PF) 0.9 % IJ SOLN
OROMUCOSAL | Status: DC | PRN
  Administered 2020-10-11 (×2): 4 mL via TOPICAL

## 2020-10-11 MED ORDER — SODIUM CHLORIDE 0.9% FLUSH
10.0000 mL | Freq: Two times a day (BID) | INTRAVENOUS | Status: DC
Start: 2020-10-11 — End: 2020-10-15
  Administered 2020-10-11 – 2020-10-14 (×6): 10 mL

## 2020-10-11 MED ORDER — PROTAMINE SULFATE 10 MG/ML IV SOLN
INTRAVENOUS | Status: DC | PRN
  Administered 2020-10-11: 30 mg via INTRAVENOUS
  Administered 2020-10-11: 300 mg via INTRAVENOUS

## 2020-10-11 MED ORDER — CEFAZOLIN SODIUM-DEXTROSE 2-4 GM/100ML-% IV SOLN
2.0000 g | Freq: Three times a day (TID) | INTRAVENOUS | Status: AC
  Administered 2020-10-11 – 2020-10-13 (×6): 2 g via INTRAVENOUS
  Filled 2020-10-11 (×6): qty 100

## 2020-10-11 MED ORDER — MIDAZOLAM HCL 5 MG/5ML IJ SOLN
INTRAMUSCULAR | Status: DC | PRN
  Administered 2020-10-11: 1 mg via INTRAVENOUS
  Administered 2020-10-11 (×2): 2 mg via INTRAVENOUS
  Administered 2020-10-11: 3 mg via INTRAVENOUS
  Administered 2020-10-11: 1 mg via INTRAVENOUS
  Administered 2020-10-11: 2 mg via INTRAVENOUS

## 2020-10-11 MED ORDER — ROCURONIUM BROMIDE 10 MG/ML (PF) SYRINGE
PREFILLED_SYRINGE | INTRAVENOUS | Status: AC
  Filled 2020-10-11: qty 10

## 2020-10-11 MED ORDER — MILRINONE LACTATE IN DEXTROSE 20-5 MG/100ML-% IV SOLN: 0.3000 ug/kg/min | INTRAVENOUS | Status: DC

## 2020-10-11 MED ORDER — SODIUM CHLORIDE 0.9% FLUSH: 3.0000 mL | INTRAVENOUS | Status: DC | PRN

## 2020-10-11 MED ORDER — SODIUM CHLORIDE 0.9 % IV SOLN: INTRAVENOUS | Status: DC | PRN

## 2020-10-11 MED ORDER — ORAL CARE MOUTH RINSE
15.0000 mL | OROMUCOSAL | Status: DC
  Administered 2020-10-11 – 2020-10-12 (×3): 15 mL via OROMUCOSAL

## 2020-10-11 MED ORDER — OXYCODONE HCL 5 MG PO TABS
5.0000 mg | ORAL_TABLET | ORAL | Status: DC | PRN
  Administered 2020-10-12 – 2020-10-13 (×3): 10 mg via ORAL
  Administered 2020-10-13: 5 mg via ORAL
  Administered 2020-10-13: 10 mg via ORAL
  Filled 2020-10-11: qty 2
  Filled 2020-10-11: qty 1
  Filled 2020-10-11 (×3): qty 2

## 2020-10-11 MED ORDER — PHENYLEPHRINE HCL-NACL 20-0.9 MG/250ML-% IV SOLN
0.0000 ug/min | INTRAVENOUS | Status: DC
  Administered 2020-10-11: 25 ug/min via INTRAVENOUS

## 2020-10-11 MED ORDER — NOREPINEPHRINE 4 MG/250ML-% IV SOLN
0.0000 ug/min | INTRAVENOUS | Status: DC
Start: 2020-10-11 — End: 2020-10-14

## 2020-10-11 MED ORDER — VANCOMYCIN HCL IN DEXTROSE 1-5 GM/200ML-% IV SOLN
1000.0000 mg | Freq: Once | INTRAVENOUS | Status: AC
  Administered 2020-10-11: 1000 mg via INTRAVENOUS
  Filled 2020-10-11: qty 200

## 2020-10-11 MED ORDER — ROCURONIUM BROMIDE 10 MG/ML (PF) SYRINGE
PREFILLED_SYRINGE | INTRAVENOUS | Status: DC | PRN
  Administered 2020-10-11: 50 mg via INTRAVENOUS
  Administered 2020-10-11 (×2): 100 mg via INTRAVENOUS

## 2020-10-11 MED ORDER — ACETAMINOPHEN 650 MG RE SUPP
650.0000 mg | Freq: Once | RECTAL | Status: AC
  Administered 2020-10-11: 650 mg via RECTAL

## 2020-10-11 MED ORDER — METOPROLOL TARTRATE 12.5 MG HALF TABLET: 12.5000 mg | ORAL_TABLET | Freq: Once | ORAL | Status: DC

## 2020-10-11 MED ORDER — FAMOTIDINE 20 MG IN NS 100 ML IVPB
20.0000 mg | Freq: Two times a day (BID) | INTRAVENOUS | Status: AC
  Administered 2020-10-11 (×2): 20 mg via INTRAVENOUS
  Filled 2020-10-11 (×3): qty 100

## 2020-10-11 MED ORDER — ACETAMINOPHEN 160 MG/5ML PO SOLN: 650.0000 mg | Freq: Once | ORAL | Status: AC

## 2020-10-11 MED ORDER — METOPROLOL TARTRATE 5 MG/5ML IV SOLN
2.5000 mg | INTRAVENOUS | Status: DC | PRN
  Administered 2020-10-12: 5 mg via INTRAVENOUS
  Filled 2020-10-11: qty 5

## 2020-10-11 MED ORDER — BISACODYL 5 MG PO TBEC
10.0000 mg | DELAYED_RELEASE_TABLET | Freq: Every day | ORAL | Status: DC
  Administered 2020-10-12 – 2020-10-14 (×3): 10 mg via ORAL
  Filled 2020-10-11 (×4): qty 2

## 2020-10-11 MED ORDER — ASPIRIN 81 MG PO CHEW
324.0000 mg | CHEWABLE_TABLET | Freq: Every day | ORAL | Status: DC
  Administered 2020-10-14: 324 mg
  Filled 2020-10-11: qty 4

## 2020-10-11 MED ORDER — HEPARIN SODIUM (PORCINE) 1000 UNIT/ML IJ SOLN
INTRAMUSCULAR | Status: AC
  Filled 2020-10-11: qty 1

## 2020-10-11 MED ORDER — ACETAMINOPHEN 160 MG/5ML PO SOLN
1000.0000 mg | Freq: Four times a day (QID) | ORAL | Status: DC
  Administered 2020-10-11: 1000 mg
  Filled 2020-10-11 (×2): qty 40.6

## 2020-10-11 MED ORDER — CHLORHEXIDINE GLUCONATE 0.12 % MT SOLN
15.0000 mL | Freq: Once | OROMUCOSAL | Status: DC
  Filled 2020-10-11: qty 15

## 2020-10-11 MED ORDER — METOPROLOL TARTRATE 25 MG/10 ML ORAL SUSPENSION: 12.5000 mg | Freq: Two times a day (BID) | ORAL | Status: DC

## 2020-10-11 MED ORDER — MIDAZOLAM HCL 2 MG/2ML IJ SOLN
2.0000 mg | INTRAMUSCULAR | Status: DC | PRN
  Administered 2020-10-11: 2 mg via INTRAVENOUS
  Filled 2020-10-11: qty 2

## 2020-10-11 MED ORDER — PROPOFOL 10 MG/ML IV BOLUS
INTRAVENOUS | Status: DC | PRN
  Administered 2020-10-11: 30 mg via INTRAVENOUS
  Administered 2020-10-11: 10 mg via INTRAVENOUS
  Administered 2020-10-11 (×2): 40 mg via INTRAVENOUS

## 2020-10-11 MED ORDER — CHLORHEXIDINE GLUCONATE CLOTH 2 % EX PADS
6.0000 | MEDICATED_PAD | Freq: Every day | CUTANEOUS | Status: DC
  Administered 2020-10-11 – 2020-10-15 (×5): 6 via TOPICAL

## 2020-10-11 MED ORDER — PHENYLEPHRINE 40 MCG/ML (10ML) SYRINGE FOR IV PUSH (FOR BLOOD PRESSURE SUPPORT)
PREFILLED_SYRINGE | INTRAVENOUS | Status: DC | PRN
  Administered 2020-10-11: 80 ug via INTRAVENOUS
  Administered 2020-10-11: 120 ug via INTRAVENOUS
  Administered 2020-10-11: 80 ug via INTRAVENOUS

## 2020-10-11 MED ORDER — ASPIRIN EC 325 MG PO TBEC
325.0000 mg | DELAYED_RELEASE_TABLET | Freq: Every day | ORAL | Status: DC
  Administered 2020-10-12 – 2020-10-15 (×3): 325 mg via ORAL
  Filled 2020-10-11 (×3): qty 1

## 2020-10-11 MED ORDER — DOCUSATE SODIUM 100 MG PO CAPS
200.0000 mg | ORAL_CAPSULE | Freq: Every day | ORAL | Status: DC
  Administered 2020-10-12 – 2020-10-14 (×3): 200 mg via ORAL
  Filled 2020-10-11 (×4): qty 2

## 2020-10-11 MED ORDER — SODIUM CHLORIDE 0.9 % IV SOLN: INTRAVENOUS | Status: DC

## 2020-10-11 MED ORDER — LACTATED RINGERS IV SOLN: 500.0000 mL | Freq: Once | INTRAVENOUS | Status: DC | PRN

## 2020-10-11 MED ORDER — PLASMA-LYTE A IV SOLN
INTRAVENOUS | Status: DC | PRN
  Administered 2020-10-11: 1000 mL via INTRAVASCULAR

## 2020-10-11 MED ORDER — FENTANYL CITRATE (PF) 250 MCG/5ML IJ SOLN
INTRAMUSCULAR | Status: AC
  Filled 2020-10-11: qty 20

## 2020-10-11 SURGICAL SUPPLY — 77 items
BAG DECANTER FOR FLEXI CONT (MISCELLANEOUS) ×3 IMPLANT
BLADE CLIPPER SURG (BLADE) ×3 IMPLANT
BLADE STERNUM SYSTEM 6 (BLADE) ×3 IMPLANT
BLADE SURG 11 STRL SS (BLADE) ×3 IMPLANT
BNDG ELASTIC 4X5.8 VLCR STR LF (GAUZE/BANDAGES/DRESSINGS) ×3 IMPLANT
BNDG ELASTIC 6X5.8 VLCR STR LF (GAUZE/BANDAGES/DRESSINGS) ×3 IMPLANT
BNDG GAUZE ELAST 4 BULKY (GAUZE/BANDAGES/DRESSINGS) ×3 IMPLANT
CABLE SURGICAL S-101-97-12 (CABLE) ×3 IMPLANT
CANISTER SUCT 3000ML PPV (MISCELLANEOUS) ×3 IMPLANT
CANNULA MC2 2 STG 29/37 NON-V (CANNULA) ×2 IMPLANT
CANNULA MC2 TWO STAGE (CANNULA) ×3
CANNULA NON VENT 20FR 12 (CANNULA) ×3 IMPLANT
CANNULA NON VENT 22FR 12 (CANNULA) ×3 IMPLANT
CATH ROBINSON RED A/P 18FR (CATHETERS) ×6 IMPLANT
CLIP TI WIDE RED SMALL 24 (CLIP) ×3 IMPLANT
CLIP VESOCCLUDE MED 24/CT (CLIP) IMPLANT
CLIP VESOCCLUDE SM WIDE 24/CT (CLIP) IMPLANT
CONNECTOR BLAKE 2:1 CARIO BLK (MISCELLANEOUS) ×3 IMPLANT
CONTAINER PROTECT SURGISLUSH (MISCELLANEOUS) ×6 IMPLANT
DEFOGGER ANTIFOG KIT (MISCELLANEOUS) ×3 IMPLANT
DRAIN CHANNEL 19F RND (DRAIN) ×9 IMPLANT
DRAPE CARDIOVASCULAR INCISE (DRAPES) ×3
DRAPE INCISE IOBAN 66X45 STRL (DRAPES) IMPLANT
DRAPE SRG 135X102X78XABS (DRAPES) ×2 IMPLANT
DRAPE WARM FLUID 44X44 (DRAPES) ×3 IMPLANT
DRSG AQUACEL AG ADV 3.5X10 (GAUZE/BANDAGES/DRESSINGS) ×3 IMPLANT
ELECT BLADE 4.0 EZ CLEAN MEGAD (MISCELLANEOUS) ×3
ELECT REM PT RETURN 9FT ADLT (ELECTROSURGICAL) ×6
ELECTRODE BLDE 4.0 EZ CLN MEGD (MISCELLANEOUS) ×2 IMPLANT
ELECTRODE REM PT RTRN 9FT ADLT (ELECTROSURGICAL) ×4 IMPLANT
FELT TEFLON 1X6 (MISCELLANEOUS) ×3 IMPLANT
GAUZE SPONGE 4X4 12PLY STRL (GAUZE/BANDAGES/DRESSINGS) ×6 IMPLANT
GAUZE SPONGE 4X4 12PLY STRL LF (GAUZE/BANDAGES/DRESSINGS) ×6 IMPLANT
GLOVE SURG ENC TEXT LTX SZ7.5 (GLOVE) ×6 IMPLANT
GOWN STRL REUS W/ TWL LRG LVL3 (GOWN DISPOSABLE) ×8 IMPLANT
GOWN STRL REUS W/ TWL XL LVL3 (GOWN DISPOSABLE) ×4 IMPLANT
GOWN STRL REUS W/TWL LRG LVL3 (GOWN DISPOSABLE) ×12
GOWN STRL REUS W/TWL XL LVL3 (GOWN DISPOSABLE) ×6
HEMOSTAT POWDER SURGIFOAM 1G (HEMOSTASIS) ×6 IMPLANT
INSERT FOGARTY XLG (MISCELLANEOUS) ×3 IMPLANT
INSERT SUTURE HOLDER (MISCELLANEOUS) ×3 IMPLANT
KIT BASIN OR (CUSTOM PROCEDURE TRAY) ×3 IMPLANT
KIT SUCTION CATH 14FR (SUCTIONS) ×3 IMPLANT
KIT TURNOVER KIT B (KITS) ×3 IMPLANT
KIT VASOVIEW HEMOPRO 2 VH 4000 (KITS) ×3 IMPLANT
LEAD PACING MYOCARDI (MISCELLANEOUS) ×3 IMPLANT
MARKER GRAFT CORONARY BYPASS (MISCELLANEOUS) ×9 IMPLANT
NS IRRIG 1000ML POUR BTL (IV SOLUTION) ×15 IMPLANT
PACK ACCESSORY CANNULA KIT (KITS) ×3 IMPLANT
PACK E OPEN HEART (SUTURE) ×3 IMPLANT
PACK OPEN HEART (CUSTOM PROCEDURE TRAY) ×3 IMPLANT
PAD ARMBOARD 7.5X6 YLW CONV (MISCELLANEOUS) ×6 IMPLANT
PAD ELECT DEFIB RADIOL ZOLL (MISCELLANEOUS) ×3 IMPLANT
PENCIL BUTTON HOLSTER BLD 10FT (ELECTRODE) ×3 IMPLANT
POSITIONER HEAD DONUT 9IN (MISCELLANEOUS) ×3 IMPLANT
PUNCH AORTIC ROTATE 4.0MM (MISCELLANEOUS) ×3 IMPLANT
SET MPS 3-ND DEL (MISCELLANEOUS) ×3 IMPLANT
SUPPORT HEART JANKE-BARRON (MISCELLANEOUS) ×3 IMPLANT
SUT BONE WAX W31G (SUTURE) ×3 IMPLANT
SUT ETHIBOND X763 2 0 SH 1 (SUTURE) ×6 IMPLANT
SUT MNCRL AB 3-0 PS2 18 (SUTURE) ×6 IMPLANT
SUT MNCRL AB 4-0 PS2 18 (SUTURE) ×6 IMPLANT
SUT PDS AB 1 CTX 36 (SUTURE) ×6 IMPLANT
SUT PROLENE 4 0 SH DA (SUTURE) ×3 IMPLANT
SUT PROLENE 5 0 C 1 36 (SUTURE) ×12 IMPLANT
SUT PROLENE 7 0 BV1 MDA (SUTURE) ×6 IMPLANT
SUT VIC AB 2-0 CT1 27 (SUTURE) ×3
SUT VIC AB 2-0 CT1 TAPERPNT 27 (SUTURE) ×2 IMPLANT
SYSTEM SAHARA CHEST DRAIN ATS (WOUND CARE) ×3 IMPLANT
TAPE CLOTH SURG 4X10 WHT LF (GAUZE/BANDAGES/DRESSINGS) ×3 IMPLANT
TAPE PAPER 2X10 WHT MICROPORE (GAUZE/BANDAGES/DRESSINGS) ×3 IMPLANT
TOWEL GREEN STERILE (TOWEL DISPOSABLE) ×3 IMPLANT
TOWEL GREEN STERILE FF (TOWEL DISPOSABLE) ×3 IMPLANT
TRAY FOLEY SLVR 16FR TEMP STAT (SET/KITS/TRAYS/PACK) ×3 IMPLANT
TUBING LAP HI FLOW INSUFFLATIO (TUBING) ×3 IMPLANT
UNDERPAD 30X36 HEAVY ABSORB (UNDERPADS AND DIAPERS) ×3 IMPLANT
WATER STERILE IRR 1000ML POUR (IV SOLUTION) ×6 IMPLANT

## 2020-10-11 NOTE — Anesthesia Procedure Notes (Signed)
Central Venous Catheter Insertion Performed by: Beryle Lathe, MD, anesthesiologist Start/End8/30/2022 12:20 PM, 10/11/2020 12:28 PM Patient location: OR. Preanesthetic checklist: patient identified, IV checked, risks and benefits discussed, surgical consent, monitors and equipment checked, pre-op evaluation, timeout performed and anesthesia consent Position: Trendelenburg Patient sedated Hand hygiene performed , maximum sterile barriers used  and Seldinger technique used Catheter size: 8.5 Fr Central line was placed.Sheath introducer Procedure performed using ultrasound guided technique. Ultrasound Notes:anatomy identified, needle tip was noted to be adjacent to the nerve/plexus identified, no ultrasound evidence of intravascular and/or intraneural injection and image(s) printed for medical record Attempts: 1 Following insertion, line sutured, dressing applied and Biopatch. Post procedure assessment: blood return through all ports, free fluid flow and no air  Patient tolerated the procedure well with no immediate complications. Additional procedure comments: Triple lumen SLIC placed via Swan port.

## 2020-10-11 NOTE — Progress Notes (Signed)
Progress Note  Patient Name: Phillip Terry Date of Encounter: 10/11/2020  [facility] Cardiologist: Norman Herrlich, MD   Subjective   Did well until this AM - more CP better with SL NTG & increased NTG gtt   Inpatient Medications    Scheduled Meds:  amLODipine  10 mg Oral QHS   aspirin EC  81 mg Oral QHS   bisacodyl  5 mg Oral Once   chlorhexidine  15 mL Mouth/Throat Once   epinephrine  0-10 mcg/min Intravenous To OR   febuxostat  40 mg Oral QHS   fenofibrate  160 mg Oral QHS   heparin-papaverine-plasmalyte irrigation   Irrigation To OR   insulin   Intravenous To OR   [facility] Blood Cardioplegia vial (lidocaine/magnesium/mannitol 0.26g-4g-6.4g)   Intracoronary To OR   metoprolol tartrate  12.5 mg Oral Once   metoprolol tartrate  25 mg Oral BID   omega-3 acid ethyl esters  2 g Oral BID   phenylephrine  30-200 mcg/min Intravenous To OR   potassium chloride  80 mEq Other To OR   rosuvastatin  20 mg Oral QHS   sodium chloride flush  3 mL Intravenous Q12H   tranexamic acid  15 mg/kg Intravenous To OR   tranexamic acid  2 mg/kg Intracatheter To OR   Continuous Infusions:  sodium chloride      ceFAZolin (ANCEF) IV      ceFAZolin (ANCEF) IV     dexmedetomidine     heparin 30,000 units/NS 1000 mL solution for CELLSAVER     heparin 1,900 Units/hr (10/11/20 0118)   milrinone     nitroGLYCERIN 15 mcg/min (10/11/20 0624)   nitroGLYCERIN 10 mcg/min (10/10/20 1317)   norepinephrine     tranexamic acid (CYKLOKAPRON) infusion ([facility])     vancomycin     PRN Meds: sodium chloride, acetaminophen, nitroGLYCERIN, ondansetron (ZOFRAN) IV, sodium chloride flush, temazepam   Vital Signs    Vitals:   10/10/20 1730 10/10/20 1900 10/11/20 0500 10/11/20 1045  BP: 129/70   (!) 139/95  Pulse:    75  Resp: 16   18  Temp:  98.1 F (36.7 C) 98.8 F (37.1 C)   TempSrc:  Oral Oral   SpO2:      Weight:   109.2 kg   Height:        Intake/Output Summary (Last 24 hours) at 10/11/2020  1059 Last data filed at 10/11/2020 0625 Gross per 24 hour  Intake 420 ml  Output 1600 ml  Net -1180 ml   Last 3 Weights 10/11/2020 10/10/2020 10/09/2020  Weight (lbs) 240 lb 11.2 oz 241 lb 1.6 oz 239 lb 11.2 oz  Weight (kg) 109.181 kg 109.362 kg 108.727 kg      Telemetry    SR rates 50s-60s - Personally Reviewed  ECG    No EKG today - Personally Reviewed  Physical Exam   GEN: No acute distress.  Resting comfortably in bed.  Neck: No JVD  or bruit Cardiac: Normal S1 & S2, RRR, no M/R/G  Respiratory: CTAB, non-laboreed. No W/W/R GI: soft, NT, ND, NABS MS: No C/C./E Neuro:  nonfocal  Psych: normal mood & affect  Labs    High Sensitivity Troponin:   Recent Labs  Lab 10/07/20 0740 10/07/20 0958  TROPONINIHS 16 15      Chemistry Recent Labs  Lab 10/07/20 0740 10/08/20 0711  NA 137 137  K 4.2 4.5  CL 106 106  CO2 23 24  GLUCOSE 121* 117*  BUN  18 16  CREATININE 1.13 1.18  CALCIUM 9.5 9.3  PROT 6.8  --   ALBUMIN 3.6  --   AST 26  --   ALT 28  --   ALKPHOS 67  --   BILITOT 0.9  --   GFRNONAA >60 >60  ANIONGAP 8 7     Hematology Recent Labs  Lab 10/09/20 0325 10/10/20 0338 10/11/20 0226  WBC 7.4 7.3 8.2  RBC 4.76 4.79 4.61  HGB 14.3 14.4 14.0  HCT 42.2 41.5 39.9  MCV 88.7 86.6 86.6  MCH 30.0 30.1 30.4  MCHC 33.9 34.7 35.1  RDW 12.8 12.5 12.7  PLT 171 171 166    BNPNo results for input(s): BNP, PROBNP in the last 168 hours.   DDimer No results for input(s): DDIMER in the last 168 hours.   Radiology    VAS US DOPPLER PRE CABG  Result Date: 10/10/2020 PREOPERATIVE VASCULAR EVALUATION Patient Name:  BURNELL HURTA  Date of Exam:   10/10/2020 Medical Rec #: [id number]   Accession #:    [id number] Date of Birth: 01-11-1946   Patient Gender: M Patient Age:   75 years Exam Location:  [facility] Procedure:      VAS US DOPPLER PRE CABG Referring Phys: HARRELL LIGHTFOOT --------------------------------------------------------------------------------   Indications:  Pre-CABG. Risk Factors: Hypertension, hyperlipidemia, no history of smoking, coronary               artery disease. Performing Technologist: Ernestene Mention RVT, RDMS  Examination Guidelines: A complete evaluation includes B-mode imaging, spectral Doppler, color Doppler, and power Doppler as needed of all accessible portions of each vessel. Bilateral testing is considered an integral part of a complete examination. Limited examinations for reoccurring indications may be performed as noted.  Right Carotid Findings: +----------+--------+--------+--------+---------------------+------------------+           PSV cm/sEDV cm/sStenosisDescribe             Comments           +----------+--------+--------+--------+---------------------+------------------+ CCA Prox  66      9                                    intimal thickening +----------+--------+--------+--------+---------------------+------------------+ CCA Distal60      10                                   intimal thickening +----------+--------+--------+--------+---------------------+------------------+ ICA Prox  56      11              heterogenous and     intimal thickening                                   focal                                   +----------+--------+--------+--------+---------------------+------------------+ ICA Distal58      13                                                      +----------+--------+--------+--------+---------------------+------------------+  ECA       117     0                                                       +----------+--------+--------+--------+---------------------+------------------+ +----------+--------+-------+----------------+------------+           PSV cm/sEDV cmsDescribe        Arm Pressure +----------+--------+-------+----------------+------------+ [id number]            Multiphasic, WNL              +----------+--------+-------+----------------+------------+ +---------+--------+--+--------+-+---------+ VertebralPSV cm/s34EDV cm/s9Antegrade +---------+--------+--+--------+-+---------+ Left Carotid Findings: +----------+-------+--------+--------+-----------------------+-----------------+           PSV    EDV cm/sStenosisDescribe               Comments                    cm/s                                                            +----------+-------+--------+--------+-----------------------+-----------------+ CCA Prox  81     14                                     intimal                                                                   thickening        +----------+-------+--------+--------+-----------------------+-----------------+ CCA Distal78     14                                     intimal                                                                   thickening        +----------+-------+--------+--------+-----------------------+-----------------+ ICA Prox  67     17      1-39%   heterogenous and                                                          calcific                                 +----------+-------+--------+--------+-----------------------+-----------------+ ICA Distal87     19                                                       +----------+-------+--------+--------+-----------------------+-----------------+  ECA       124    0                                                        +----------+-------+--------+--------+-----------------------+-----------------+ +----------+--------+--------+----------------+------------+ SubclavianPSV cm/sEDV cm/sDescribe        Arm Pressure +----------+--------+--------+----------------+------------+           145             Multiphasic, WNL             +----------+--------+--------+----------------+------------+ +---------+--------+--+--------+--+---------+  VertebralPSV cm/s57EDV cm/s11Antegrade +---------+--------+--+--------+--+---------+  ABI Findings: +--------+------------------+-----+---------+--------+ Right   Rt Pressure (mmHg)IndexWaveform Comment  +--------+------------------+-----+---------+--------+ Brachial                       triphasic         +--------+------------------+-----+---------+--------+ ATA                            triphasic         +--------+------------------+-----+---------+--------+ PTA                            triphasic         +--------+------------------+-----+---------+--------+ +--------+------------------+-----+---------+-------+ Left    Lt Pressure (mmHg)IndexWaveform Comment +--------+------------------+-----+---------+-------+ Brachial                       triphasic        +--------+------------------+-----+---------+-------+ ATA                            triphasic        +--------+------------------+-----+---------+-------+ PTA                            triphasic        +--------+------------------+-----+---------+-------+ Pre-CABG performed on imager due to patient being unable to travel (nitro-drip) ABIs not obtained however waveforms were normal.  Right Doppler Findings: +-----------+--------+-----+---------+--------+ Site       PressureIndexDoppler  Comments +-----------+--------+-----+---------+--------+ Brachial                triphasic         +-----------+--------+-----+---------+--------+ Radial                  biphasic          +-----------+--------+-----+---------+--------+ Ulnar                   triphasic         +-----------+--------+-----+---------+--------+ Palmar Arch             triphasic         +-----------+--------+-----+---------+--------+  Left Doppler Findings: +-----------+--------+-----+---------+--------+ Site       PressureIndexDoppler  Comments +-----------+--------+-----+---------+--------+ Brachial                 triphasic         +-----------+--------+-----+---------+--------+ Radial                  triphasic         +-----------+--------+-----+---------+--------+ Ulnar                   triphasic         +-----------+--------+-----+---------+--------+  Palmar Arch             biphasic          +-----------+--------+-----+---------+--------+  Summary: Right Carotid: The extracranial vessels were near-normal with only minimal wall                thickening or plaque. Left Carotid: Velocities in the left ICA are consistent with a 1-39% stenosis.               The ECA & CCA were near-normal with only minimal wall thickening               or plaque. Vertebrals:  Bilateral vertebral arteries demonstrate antegrade flow. Subclavians: Normal flow hemodynamics were seen in bilateral subclavian              arteries. Right ABI: Pre-CABG performed on imager due to patient being unable to travel (nitro-drip) ABIs not obtained however waveforms were normal (triphasic). Left ABI: Pre-CABG performed on imager due to patient being unable to travel (nitro-drip) ABIs not obtained however waveforms were normal (triphasic). Right Upper Extremity: Normal PPG waveforms with radial artery compression suggest palmar arch patency. Doppler waveforms remain within normal limits with right radial compression. Doppler waveforms decrease <50% with right ulnar compression. Left Upper Extremity: Normal PPG waveforms with radial artery compression suggest palmar arch patency. Doppler waveforms remain within normal limits with left radial compression. Doppler waveforms remain within normal limits with left ulnar compression.  Electronically signed by Lemar LivingsBrandon Cain MD on 10/10/2020 at 5:39:18 PM.    Final     Cardiac Studies   Echo: 10/07/20: EF 55-60%. Normal LV Fxn. NO RWMA. GR 1 DD. Mild AoV calcification (sclerosis) -no AS. Normal RA&LA. Normal valves.  Cath 09/23/2020   Patient Profile     75 y.o. male with  recently diagnosed multivessel coronary artery disease pending OP hybrid CABG/PCI 10/24/20, idiopathic pulmonary fibrosis, HTN, HLD, OSA compliant with CPAP who is being seen 10/07/2020 for the evaluation of chest pain.  Assessment & Plan    Unstable angina: initially planned for outpatient CABG, but developed recurrent symptoms and admitted for temporizing management of angina. Developed recurrent angina this morning, which improved with SL nitro. Echo noted above with EF of 55-60%, G1DD, no rWMA. -- > Planned for CABG (with Hybrid PCI of RCA) - TODAY WITH Dr. Cliffton AstersLightfoot. D/C IV heparin ON CALL TO OR ASA, Metoprolol & Crestor per OP (reduced BB dose).  IV NTG until OR  HTN: stable on CCB & metoprolol  HLD: LDL 29 on current dose of Crestor.   Sinus bradycardia: metoprolol was reduced to 25mg  BID yesterday, HR stable - OK to continue post-op if BP tolerates.   Pulmonary Fibrosis - underlying condition; has milrinone ordered for post-op  For questions or updates, please contact [facility] Please consult [email or web address] for contact info under        Signed, Bryan Lemmaavid Prima Rayner, MD  10/11/2020, 10:59 AM     Bryan Lemmaavid Nevaeha Finerty, M.D., M.S. Interventional Cardiologist   Pager # [phone] Phone # [phone] [address] [place], [place] [id number]

## 2020-10-11 NOTE — Anesthesia Postprocedure Evaluation (Signed)
Anesthesia Post Note  Patient: Phillip Terry  Procedure(s) Performed: CORONARY ARTERY BYPASS GRAFTING (CABG), ON PUMP, TIMES THREE, USING LEFT INTERNAL MAMMARY ARTERY AND RIGHT ENDOSCOPICALLY HARVESTED GREATER SAPHENOUS VEIN (Chest) TRANSESOPHAGEAL ECHOCARDIOGRAM (TEE)     Patient location during evaluation: ICU Anesthesia Type: General Level of consciousness: sedated and patient remains intubated per anesthesia plan Pain management: pain level controlled Vital Signs Assessment: post-procedure vital signs reviewed and stable Respiratory status: patient remains intubated per anesthesia plan Cardiovascular status: stable Postop Assessment: no apparent nausea or vomiting Anesthetic complications: no   No notable events documented.  Last Vitals:  Vitals:   10/11/20 1724 10/11/20 1742  BP:  (!) 151/66  Pulse:  60  Resp:  12  Temp:    SpO2: 99% 99%    Last Pain:  Vitals:   10/11/20 1125  TempSrc:   PainSc: 0-No pain                 Beryle Lathe

## 2020-10-11 NOTE — Transfer of Care (Signed)
Immediate Anesthesia Transfer of Care Note  Patient: Phillip Terry  Procedure(s) Performed: CORONARY ARTERY BYPASS GRAFTING (CABG), ON PUMP, TIMES THREE, USING LEFT INTERNAL MAMMARY ARTERY AND RIGHT ENDOSCOPICALLY HARVESTED GREATER SAPHENOUS VEIN (Chest) TRANSESOPHAGEAL ECHOCARDIOGRAM (TEE)  Patient Location: SICU  Anesthesia Type:General  Level of Consciousness: Patient remains intubated per anesthesia plan  Airway & Oxygen Therapy: Patient remains intubated per anesthesia plan and Patient placed on Ventilator (see vital sign flow sheet for setting)  Post-op Assessment: Report given to RN and Post -op Vital signs reviewed and stable  Post vital signs: Reviewed and stable  Last Vitals:  Vitals Value Taken Time  BP 123/53   Temp    Pulse 60   Resp 14 10/11/20 1726  SpO2 98 % 10/11/20 1726  Vitals shown include unvalidated device data.  Last Pain:  Vitals:   10/11/20 1125  TempSrc:   PainSc: 0-No pain         Complications: No notable events documented.

## 2020-10-11 NOTE — Anesthesia Procedure Notes (Signed)
Arterial Line Insertion Start/End8/30/2022 12:05 PM, 10/11/2020 12:09 PM Performed by: Lonia Mad, CRNA, CRNA  Patient location: OR. Preanesthetic checklist: patient identified, IV checked, site marked, risks and benefits discussed, surgical consent, monitors and equipment checked, pre-op evaluation, timeout performed and anesthesia consent Lidocaine 1% used for infiltration and patient sedated Right, radial was placed Catheter size: 20 G Hand hygiene performed  and maximum sterile barriers used   Attempts: 2 (Attmepted by K. Tonkin on L side) Procedure performed without using ultrasound guided technique. Following insertion, dressing applied and Biopatch. Post procedure assessment: normal and unchanged  Patient tolerated the procedure well with no immediate complications.

## 2020-10-11 NOTE — Progress Notes (Signed)
Patient ID: Phillip Terry, male   DOB: 04-03-1945, 75 y.o.   MRN: [id number]  TCTS Evening Rounds:   Hemodynamically stable  CI = 2.2  Still intubated.  Urine output good  CT output low  CBC    Component Value Date/Time   WBC 19.3 (H) 10/11/2020 1730   RBC 4.52 10/11/2020 1730   HGB 13.9 10/11/2020 1730   HGB 16.2 09/16/2020 1451   HCT 39.8 10/11/2020 1730   HCT 47.8 09/16/2020 1451   PLT 155 10/11/2020 1730   PLT 247 09/16/2020 1451   MCV 88.1 10/11/2020 1730   MCV 88 09/16/2020 1451   MCH 30.8 10/11/2020 1730   MCHC 34.9 10/11/2020 1730   RDW 12.7 10/11/2020 1730   RDW 13.6 09/16/2020 1451   LYMPHSABS 2.0 10/07/2020 0856   MONOABS 0.6 10/07/2020 0856   EOSABS 0.1 10/07/2020 0856   BASOSABS 0.1 10/07/2020 0856     BMET    Component Value Date/Time   NA 139 10/11/2020 1729   NA 140 09/16/2020 1451   K 4.9 10/11/2020 1729   CL 107 10/11/2020 1620   CO2 24 10/08/2020 0711   GLUCOSE 160 (H) 10/11/2020 1620   BUN 15 10/11/2020 1620   BUN 26 09/16/2020 1451   CREATININE 0.80 10/11/2020 1620   CALCIUM 9.3 10/08/2020 0711   GFRNONAA >60 10/08/2020 0711   GFRAA 75 12/21/2019 1449     A/P:  Stable postop course. Continue current plans. Wean to extubate

## 2020-10-11 NOTE — Procedures (Signed)
Extubation Procedure Note  Patient Details:   Name: Phillip Terry DOB: Feb 28, 1945 MRN: [id number]   Airway Documentation:    Vent end date: 10/11/20 Vent end time: 2304   Evaluation  O2 sats: stable throughout Complications: No apparent complications Patient did tolerate procedure well. Bilateral Breath Sounds: Rhonchi   Yes  Cuff leak noted prior to extubation  NIF -28 VC 1.2L  Alvaretta Eisenberger R Dezmon Conover 10/11/2020, 11:13 PM

## 2020-10-11 NOTE — Op Note (Signed)
[address]       [name] [address]             [phone]                                          10/11/2020 Patient:  Phillip Terry Pre-Op Dx: Unstable angina   Two-vessel coronary artery disease   Hypertension   Hyperlipidemia Post-op Dx: Same Procedure: CABG X 3, LIMA LAD, reverse saphenous vein graft to PDA, reverse saphenous vein graft to diagonal branch. Endoscopic greater saphenous vein harvest on the right   Surgeon and Role:      * Liani Caris, Eliezer Lofts, MD - Primary    *E. Barrett, PA-C- assisting  Anesthesia  general EBL: 500 ml Blood Administration: None Xclamp Time: 50 min Pump Time: 95 min  Drains: 19 F blake drain: L, mediastinal  Wires: Ventricular Counts: correct   Indications: Phillip Terry is well-known to our service.  He was originally evaluated for a robotic assisted hybrid MIDCAB as an outpatient.  He represents to the emergency department with nocturnal symptoms.  He is currently chest pain-free.  Given his presentation and timing, we will plan for a traditional CABG this hospitalization.  Findings: Good LIMA.  Good vein conduit.  Good sized LAD free of calcification at the anastomosis.  The diagonal was heavily calcified.  There was a soft area in the PDA that was free of calcification.  Operative Technique: All invasive lines were placed in pre-op holding.  After the risks, benefits and alternatives were thoroughly discussed, the patient was brought to the operative theatre.  Anesthesia was induced, and the patient was prepped and draped in normal sterile fashion.  An appropriate surgical pause was performed, and pre-operative antibiotics were dosed accordingly.  We began with simultaneous incisions along the right leg for harvesting of the greater saphenous vein and the chest for the sternotomy.  In regards to the sternotomy, this was carried down with bovie cautery, and the sternum was divided with a reciprocating saw.   Meticulous hemostasis was obtained.  The left internal thoracic artery was exposed and harvested in in pedicled fashion.  The patient was systemically heparinized, and the artery was divided distally, and placed in a papaverine sponge.    The sternal elevator was removed, and a retractor was placed.  The pericardium was divided in the midline and fashioned into a cradle with pericardial stitches.   After we confirmed an appropriate ACT, the ascending aorta was cannulated in standard fashion.  The right atrial appendage was used for venous cannulation site.  Cardiopulmonary bypass was initiated, and the heart retractor was placed. The cross clamp was applied, and a dose of anterograde cardioplegia was given with good arrest of the heart.  We moved to the posterior wall of the heart, and found a good target on the PDA.  An arteriotomy was made, and the vein graft was anastomosed to it in an end to side fashion.    Next, we exposed the anterior wall of the heart and identified a good target on the diagonal branch.   An arteriotomy was created.  The vein was anastomosed in an end to side fashion.  Finally, we exposed a good target on the LAD, and fashioned an end to side anastomosis between it and the LITA.  We began to re-warm, and a  re-animation dose of cardioplegia was given.  The heart was de-aired, and the cross clamp was removed.  Meticulous hemostasis was obtained.    A partial occludding clamp was then placed on the ascending aorta, and we created an end to side anastomosis between it and the proximal vein grafts.  The proximal sites were marked with rings.  Hemostasis was obtained, and we separated from cardiopulmonary bypass without event.  The heparin was reversed with protamine.  Chest tubes and wires were placed, and the sternum was re-approximated with sternal wires.  The soft tissue and skin were re-approximated wth absorbable suture.    The patient tolerated the procedure without any immediate  complications, and was transferred to the ICU in guarded condition.  Kemond Amorin Keane Scrape

## 2020-10-11 NOTE — Anesthesia Procedure Notes (Signed)
Procedure Name: Intubation Date/Time: 10/11/2020 12:07 PM Performed by: Aundria Rud, CRNA Pre-anesthesia Checklist: Patient identified, Emergency Drugs available, Suction available and Patient being monitored Patient Re-evaluated:Patient Re-evaluated prior to induction Oxygen Delivery Method: Circle System Utilized Preoxygenation: Pre-oxygenation with 100% oxygen Induction Type: IV induction Ventilation: Mask ventilation without difficulty, Two handed mask ventilation required and Oral airway inserted - appropriate to patient size Laryngoscope Size: Glidescope and 4 Grade View: Grade II Tube type: Oral Tube size: 8.0 mm Number of attempts: 1 Airway Equipment and Method: Stylet and Oral airway Placement Confirmation: ETT inserted through vocal cords under direct vision, positive ETCO2 and breath sounds checked- equal and bilateral Secured at: 23 cm Tube secured with: Tape Dental Injury: Teeth and Oropharynx as per pre-operative assessment  Difficulty Due To: Difficulty was anticipated, Difficult Airway- due to limited oral opening, Difficult Airway- due to reduced neck mobility and Difficult Airway- due to large tongue Future Recommendations: Recommend- induction with short-acting agent, and alternative techniques readily available

## 2020-10-11 NOTE — Care Management Important Message (Signed)
Important Message  Patient Details  Name: Phillip Terry MRN: [id number] Date of Birth: 12-31-1945   Medicare Important Message Given:  Yes     Renie Ora 10/11/2020, 8:54 AM

## 2020-10-11 NOTE — Progress Notes (Signed)
ANTICOAGULATION CONSULT NOTE - Follow Up Consult  Pharmacy Consult for Heparin Indication: chest pain/ACS  No Known Allergies  Patient Measurements: Height: 6' (182.9 cm) Weight: 109.2 kg (240 lb 11.2 oz) IBW/kg (Calculated) : 77.6 Heparin Dosing Weight: 101 kg  Vital Signs: Temp: 98.8 F (37.1 C) (08/30 0500) Temp Source: Oral (08/30 0500)  Labs: Recent Labs    10/09/20 0325 10/10/20 0338 10/11/20 0226  HGB 14.3 14.4 14.0  HCT 42.2 41.5 39.9  PLT 171 171 166  HEPARINUNFRC 0.53 0.51 0.39     Estimated Creatinine Clearance: 71.1 mL/min (by C-G formula based on SCr of 1.18 mg/dL).  Assessment: 70 yom with history of multivessel coronary artery disease pending OP hybrid CABG/PCI 10/24/20, idiopathic pulmonary fibrosis, HTN, HLD, OSA compliant with CPAP. Presented with chest pain 10/07/20. Pharmacy consulted for Heparin dosing for unstable angina. Now planning traditional CABG.    Heparin level remains therapeutic (0.39) on 1900 units/hr.   CBC stable.    Goal of Therapy:  Heparin level 0.3-0.7 units/ml Monitor platelets by anticoagulation protocol: Yes   Plan:  Continue heparin drip at 1900 units/hr. Daily heparin level and CBC while on heparin. CABG scheduled for later today.   Dennie Fetters, RPh 10/11/2020,7:26 AM

## 2020-10-11 NOTE — Brief Op Note (Signed)
10/07/2020 - 10/11/2020  7:35 AM  PATIENT:  Phillip Terry  75 y.o. male  PRE-OPERATIVE DIAGNOSIS:  Coronary Artery Disease  POST-OPERATIVE DIAGNOSIS:  Coronary Artery Disease  PROCEDURE:  Procedure(s):  CORONARY ARTERY BYPASS GRAFTING x 3 -LIMA to LAD -SVG to DIAG #2 -SVG to PDA  ENDOSCOPIC HARVEST GREATER SAPHENOUS VEIN -Right Leg  TRANSESOPHAGEAL ECHOCARDIOGRAM (TEE) (N/A)  SURGEON:  Surgeon(s) and Role:    * Lightfoot, Eliezer Lofts, MD - Primary  PHYSICIAN ASSISTANT: Lowella Dandy PA-C  ASSISTANTS: Mardi Mainland RNFA, Tanda Rockers RNFA   ANESTHESIA:   general  EBL:  700 mL   BLOOD ADMINISTERED:   CC CELLSAVER  DRAINS:  Mediastinal Blake Drains    LOCAL MEDICATIONS USED:  NONE  SPECIMEN:  No Specimen  DISPOSITION OF SPECIMEN:  N/A  COUNTS:  YES  TOURNIQUET:  * No tourniquets in log *  DICTATION: .Dragon Dictation  PLAN OF CARE: Admit to inpatient   PATIENT DISPOSITION:  ICU - intubated and hemodynamically stable.   Delay start of Pharmacological VTE agent (>24hrs) due to surgical blood loss or risk of bleeding: yes

## 2020-10-11 NOTE — Progress Notes (Signed)
     [address]       [place] [address]             [phone]       No events  Vitals:   10/10/20 1900 10/11/20 0500  BP:    Pulse:    Resp:    Temp: 98.1 F (36.7 C) 98.8 F (37.1 C)  SpO2:     Alert NAD Sinus EWOB  OR today for CABG  Phillip Terry O Diedra Sinor

## 2020-10-11 NOTE — Anesthesia Preprocedure Evaluation (Addendum)
Anesthesia Evaluation  Patient identified by MRN, date of birth, ID band Patient awake    Reviewed: Allergy & Precautions, NPO status , Patient's Chart, lab work & pertinent test results  History of Anesthesia Complications Negative for: history of anesthetic complications  Airway Mallampati: IV  TM Distance: <3 FB Neck ROM: Full    Dental  (+) Dental Advisory Given   Pulmonary sleep apnea and Continuous Positive Airway Pressure Ventilation ,   Pulmonary fibrosis    Pulmonary exam normal        Cardiovascular hypertension, Pt. on medications and Pt. on home beta blockers + angina + CAD  Normal cardiovascular exam   '22 Carotid US - 1-39% left ICAS, normal right carotid  '22 TTE - EF 55 to 60%. Left ventricular endocardial border not optimally defined to evaluate regional wall motion, but appears grossly normal. There is mild left  ventricular hypertrophy. Grade I diastolic dysfunction (impaired relaxation). Trivial MR, TR, and PR.  '22 Cath - Prox RCA to Mid RCA lesion is 60-70% stenosed. .  Dist Cx lesion is 50% stenosed. .  Mid LAD lesion is 75% stenosed.  This is a long segment of disease starting immediately after the large diagonal branch. .  Prox LAD lesion is 90% stenosed. .  2nd Diag lesion is 80% stenosed.  This is a small vessel which likely could be treated medically. [name]  RPAV lesion is 50% stenosed.    Neuro/Psych  Neuromuscular disease negative psych ROS   GI/Hepatic negative GI ROS, Neg liver ROS,   Endo/Other   Obesity   Renal/GU negative Renal ROS     Musculoskeletal negative musculoskeletal ROS (+)   Abdominal   Peds  Hematology negative hematology ROS (+)   Anesthesia Other Findings   Reproductive/Obstetrics                           Anesthesia Physical Anesthesia Plan  ASA: 4  Anesthesia Plan: General   Post-op Pain Management:    Induction:  Intravenous  PONV Risk Score and Plan: 2 and Treatment may vary due to age or medical condition  Airway Management Planned: Oral ETT and Video Laryngoscope Planned  Additional Equipment: Arterial line, CVP, TEE and Ultrasound Guidance Line Placement  Intra-op Plan:   Post-operative Plan: Post-operative intubation/ventilation  Informed Consent: I have reviewed the patients History and Physical, chart, labs and discussed the procedure including the risks, benefits and alternatives for the proposed anesthesia with the patient or authorized representative who has indicated his/her understanding and acceptance.     Dental advisory given  Plan Discussed with: CRNA, Anesthesiologist and Surgeon  Anesthesia Plan Comments:        Anesthesia Quick Evaluation

## 2020-10-12 ENCOUNTER — Inpatient Hospital Stay (HOSPITAL_COMMUNITY): Payer: Medicare PPO

## 2020-10-12 ENCOUNTER — Other Ambulatory Visit (HOSPITAL_COMMUNITY): Payer: Medicare PPO

## 2020-10-12 ENCOUNTER — Encounter (HOSPITAL_COMMUNITY): Payer: Self-pay | Admitting: Thoracic Surgery (Cardiothoracic Vascular Surgery)

## 2020-10-12 LAB — POCT I-STAT 7, (LYTES, BLD GAS, ICA,H+H)
Acid-base deficit: 7 mmol/L — ABNORMAL HIGH (ref 0.0–2.0)
Acid-base deficit: 8 mmol/L — ABNORMAL HIGH (ref 0.0–2.0)
Acid-base deficit: 8 mmol/L — ABNORMAL HIGH (ref 0.0–2.0)
Bicarbonate: 15.7 mmol/L — ABNORMAL LOW (ref 20.0–28.0)
Bicarbonate: 16.1 mmol/L — ABNORMAL LOW (ref 20.0–28.0)
Bicarbonate: 16.1 mmol/L — ABNORMAL LOW (ref 20.0–28.0)
Calcium, Ion: 1.01 mmol/L — ABNORMAL LOW (ref 1.15–1.40)
Calcium, Ion: 1.01 mmol/L — ABNORMAL LOW (ref 1.15–1.40)
Calcium, Ion: 1.05 mmol/L — ABNORMAL LOW (ref 1.15–1.40)
HCT: 31 % — ABNORMAL LOW (ref 39.0–52.0)
HCT: 32 % — ABNORMAL LOW (ref 39.0–52.0)
HCT: 34 % — ABNORMAL LOW (ref 39.0–52.0)
Hemoglobin: 10.5 g/dL — ABNORMAL LOW (ref 13.0–17.0)
Hemoglobin: 10.9 g/dL — ABNORMAL LOW (ref 13.0–17.0)
Hemoglobin: 11.6 g/dL — ABNORMAL LOW (ref 13.0–17.0)
O2 Saturation: 95 %
O2 Saturation: 97 %
O2 Saturation: 98 %
Patient temperature: 38
Patient temperature: 38
Patient temperature: 38.1
Potassium: 3.5 mmol/L (ref 3.5–5.1)
Potassium: 3.6 mmol/L (ref 3.5–5.1)
Potassium: 3.6 mmol/L (ref 3.5–5.1)
Sodium: 143 mmol/L (ref 135–145)
Sodium: 144 mmol/L (ref 135–145)
Sodium: 144 mmol/L (ref 135–145)
TCO2: 16 mmol/L — ABNORMAL LOW (ref 22–32)
TCO2: 17 mmol/L — ABNORMAL LOW (ref 22–32)
TCO2: 17 mmol/L — ABNORMAL LOW (ref 22–32)
pCO2 arterial: 26.3 mmHg — ABNORMAL LOW (ref 32.0–48.0)
pCO2 arterial: 28.1 mmHg — ABNORMAL LOW (ref 32.0–48.0)
pCO2 arterial: 28.4 mmHg — ABNORMAL LOW (ref 32.0–48.0)
pH, Arterial: 7.359 (ref 7.350–7.450)
pH, Arterial: 7.365 (ref 7.350–7.450)
pH, Arterial: 7.4 (ref 7.350–7.450)
pO2, Arterial: 103 mmHg (ref 83.0–108.0)
pO2, Arterial: 82 mmHg — ABNORMAL LOW (ref 83.0–108.0)
pO2, Arterial: 91 mmHg (ref 83.0–108.0)

## 2020-10-12 LAB — BASIC METABOLIC PANEL
Anion gap: 8 (ref 5–15)
Anion gap: 8 (ref 5–15)
BUN: 15 mg/dL (ref 8–23)
BUN: 17 mg/dL (ref 8–23)
CO2: 18 mmol/L — ABNORMAL LOW (ref 22–32)
CO2: 25 mmol/L (ref 22–32)
Calcium: 7.7 mg/dL — ABNORMAL LOW (ref 8.9–10.3)
Calcium: 8.4 mg/dL — ABNORMAL LOW (ref 8.9–10.3)
Chloride: 109 mmol/L (ref 98–111)
Chloride: 100 mmol/L (ref 98–111)
Creatinine, Ser: 1.01 mg/dL (ref 0.61–1.24)
Creatinine, Ser: 1.11 mg/dL (ref 0.61–1.24)
GFR, Estimated: >60 mL/min (ref >60)
GFR, Estimated: >60 mL/min (ref >60)
Glucose, Bld: 174 mg/dL — ABNORMAL HIGH (ref 70–99)
Glucose, Bld: 138 mg/dL — ABNORMAL HIGH (ref 70–99)
Potassium: 4 mmol/L (ref 3.5–5.1)
Potassium: 4.4 mmol/L (ref 3.5–5.1)
Sodium: 135 mmol/L (ref 135–145)
Sodium: 133 mmol/L — ABNORMAL LOW (ref 135–145)

## 2020-10-12 LAB — CBC
HCT: 36.5 % — ABNORMAL LOW (ref 39.0–52.0)
HCT: 37.2 % — ABNORMAL LOW (ref 39.0–52.0)
Hemoglobin: 13 g/dL (ref 13.0–17.0)
Hemoglobin: 12.5 g/dL — ABNORMAL LOW (ref 13.0–17.0)
MCH: 30.8 pg (ref 26.0–34.0)
MCH: 29.8 pg (ref 26.0–34.0)
MCHC: 35.6 g/dL (ref 30.0–36.0)
MCHC: 33.6 g/dL (ref 30.0–36.0)
MCV: 86.5 fL (ref 80.0–100.0)
MCV: 88.8 fL (ref 80.0–100.0)
Platelets: 158 10*3/uL (ref 150–400)
Platelets: 145 10*3/uL — ABNORMAL LOW (ref 150–400)
RBC: 4.22 MIL/uL (ref 4.22–5.81)
RBC: 4.19 MIL/uL — ABNORMAL LOW (ref 4.22–5.81)
RDW: 12.8 % (ref 11.5–15.5)
RDW: 13.1 % (ref 11.5–15.5)
WBC: 16.5 10*3/uL — ABNORMAL HIGH (ref 4.0–10.5)
WBC: 15.2 10*3/uL — ABNORMAL HIGH (ref 4.0–10.5)
nRBC: 0 % (ref 0.0–0.2)
nRBC: 0 % (ref 0.0–0.2)

## 2020-10-12 LAB — MAGNESIUM
Magnesium: 2.3 mg/dL (ref 1.7–2.4)
Magnesium: 2.2 mg/dL (ref 1.7–2.4)

## 2020-10-12 LAB — GLUCOSE, CAPILLARY
Glucose-Capillary: 119 mg/dL — ABNORMAL HIGH (ref 70–99)
Glucose-Capillary: 128 mg/dL — ABNORMAL HIGH (ref 70–99)
Glucose-Capillary: 130 mg/dL — ABNORMAL HIGH (ref 70–99)
Glucose-Capillary: 134 mg/dL — ABNORMAL HIGH (ref 70–99)
Glucose-Capillary: 136 mg/dL — ABNORMAL HIGH (ref 70–99)
Glucose-Capillary: 141 mg/dL — ABNORMAL HIGH (ref 70–99)
Glucose-Capillary: 145 mg/dL — ABNORMAL HIGH (ref 70–99)
Glucose-Capillary: 146 mg/dL — ABNORMAL HIGH (ref 70–99)
Glucose-Capillary: 153 mg/dL — ABNORMAL HIGH (ref 70–99)
Glucose-Capillary: 165 mg/dL — ABNORMAL HIGH (ref 70–99)
Glucose-Capillary: 168 mg/dL — ABNORMAL HIGH (ref 70–99)
Glucose-Capillary: 76 mg/dL (ref 70–99)

## 2020-10-12 MED ORDER — AMIODARONE LOAD VIA INFUSION
150.0000 mg | Freq: Once | INTRAVENOUS | Status: AC
  Administered 2020-10-12: 150 mg via INTRAVENOUS
  Filled 2020-10-12: qty 83.34

## 2020-10-12 MED ORDER — AMIODARONE HCL IN DEXTROSE 360-4.14 MG/200ML-% IV SOLN
60.0000 mg/h | INTRAVENOUS | Status: AC
  Administered 2020-10-12 – 2020-10-13 (×2): 60 mg/h via INTRAVENOUS
  Filled 2020-10-12 (×2): qty 200

## 2020-10-12 MED ORDER — SODIUM CHLORIDE 0.9 % IV SOLN
12.5000 mg | Freq: Four times a day (QID) | INTRAVENOUS | Status: DC | PRN
  Administered 2020-10-12 – 2020-10-13 (×4): 12.5 mg via INTRAVENOUS
  Filled 2020-10-12 (×4): qty 0.5

## 2020-10-12 MED ORDER — INSULIN ASPART 100 UNIT/ML IJ SOLN: 0.0000 [IU] | INTRAMUSCULAR | Status: DC

## 2020-10-12 MED ORDER — ORAL CARE MOUTH RINSE
15.0000 mL | OROMUCOSAL | Status: DC
  Administered 2020-10-12 – 2020-10-13 (×6): 15 mL via OROMUCOSAL

## 2020-10-12 MED ORDER — ENOXAPARIN SODIUM 30 MG/0.3ML IJ SOSY
30.0000 mg | PREFILLED_SYRINGE | Freq: Every day | INTRAMUSCULAR | Status: DC
  Administered 2020-10-12 – 2020-10-14 (×3): 30 mg via SUBCUTANEOUS
  Filled 2020-10-12 (×3): qty 0.3

## 2020-10-12 MED ORDER — INSULIN ASPART 100 UNIT/ML IJ SOLN
0.0000 [IU] | INTRAMUSCULAR | Status: DC
  Administered 2020-10-12: 2 [IU] via SUBCUTANEOUS
  Administered 2020-10-12 (×2): 4 [IU] via SUBCUTANEOUS
  Administered 2020-10-12 – 2020-10-13 (×5): 2 [IU] via SUBCUTANEOUS

## 2020-10-12 MED ORDER — AMIODARONE HCL IN DEXTROSE 360-4.14 MG/200ML-% IV SOLN
30.0000 mg/h | INTRAVENOUS | Status: DC
  Administered 2020-10-13 (×2): 30 mg/h via INTRAVENOUS
  Filled 2020-10-12 (×2): qty 200

## 2020-10-12 NOTE — Discharge Summary (Signed)
[address]       [place] [address]             [phone]    Physician Discharge Summary  Patient ID: Phillip Terry MRN: [id number] DOB/AGE: 73-Jun-1947 73 y.o.  Admit date: 10/07/2020 Discharge date: 10/15/2020  Admission Diagnoses:  Patient Active Problem List   Diagnosis Date Noted   Hyperglycemia 10/07/2020   Unstable angina (HCC) 10/07/2020   CAD (coronary artery disease)    Chest pain 08/22/2020   DOE (dyspnea on exertion) 10/27/2019   Pulmonary fibrosis (HCC) 10/27/2019   Essential hypertension    Hyperlipidemia    Peripheral neuropathy    Calculus of kidney    OSA on CPAP 03/16/2007   Discharge Diagnoses:  Patient Active Problem List   Diagnosis Date Noted   S/P CABG x 3 10/14/2020   Hyperglycemia 10/07/2020   Unstable angina (HCC) 10/07/2020   CAD (coronary artery disease)    Chest pain 08/22/2020   DOE (dyspnea on exertion) 10/27/2019   Pulmonary fibrosis (HCC) 10/27/2019   Essential hypertension    Hyperlipidemia    Peripheral neuropathy    Calculus of kidney    OSA on CPAP 03/16/2007    Discharged Condition: good  History of Present Illness:  Phillip Terry is a 75 yo male with history of HTN, Hyperlipidemia, OSA, Peripheral neuropathy, and 2V CAD.  He developed complaints of shortness of breath a year ago.  He was worked up and diagnosed with Pulmonary fibrosis and was treated with inhalers.  His symptoms persisted and he underwent a Coronary CT scan which showed evidence concerning for CAD.  He was referred to Cardiology and underwent cardiac catheterization which showed severe LAD disease and moderate RCA stenosis.  It was felt the patient would benefit from surgical intervention and he was referred to [facility] for evaluation.  He saw Dr. Cliffton Asters at which time he admitted to occasional chest pain.  He continued to experience shortness of breath.  It was felt the patient would benefit from coronary bypass  grafting procedure.  It was felt this could be done Robotically assisted with LIMA to LAD.  The risks and benefits of the procedure were explained to the patient and he was agreeable to proceed.  Unfortunately, the patient presented to the ED on 8/26 with complaints of chest pain with radiation down his left arm with associated shortness of breath.  He took NTG with relief of symptoms.  Workup in the ED showed negative Troponin level and EKG showed no acute changes from previous EKGs.  He was admitted to Cardiology service for further care.  Hospital Course:  He remained chest pain free in the hospital.  He underwent repeat Echocardiogram which showed preserved EF.  After additional review of his catheterization films it was felt the patient should undergo traditional coronary bypass grafting this admission.  The risks and benefits of the procedure were again explained to the patient and he was agreeable to proceed.  He was taken to the operating room on 10/11/2020.  He underwent CABG x 3 utilizing LIMA to LAD, SVG to PDA, and SVG to Diagonal #2.  He also underwent endoscopic harvest of greater saphenous vein from his right leg.  He tolerated the procedure without difficulty and was taken to the SICU in stable condition. He was extubated the evening of surgery without difficulty.  He was weaned off Neo-synephrine as hemodynamics allowed.  His arterial lines and pacing  wires were removed POD #1.  His chest tube output decreased and they were placed on bulb suction.  These were ultimately removed on 9/1.  He is on aspirin, statin and beta-blocker.  On postop day 2 he did develop atrial fibrillation and has been started on amiodarone drip.  This has chemically converted him back to sinus rhythm and we are also adjusting beta-blocker.  He remained in NSR and his Amiodarone was converted to an oral regimen.  He was volume overloaded and treated with oral Lasix.  He was weaned off oxygen .  He used his home CPAP for  his history of OSA.  He was felt medically stable for transfer to the progressive care unit on 10/15/2020.  He remained in stable SR and regained independence with mobility.  He was ready for discharge on post-op day 4.  .      Consults: None  Significant Diagnostic Studies: angiography:     Prox RCA to Mid RCA lesion is 60-70% stenosed.   Dist Cx lesion is 50% stenosed.   Mid LAD lesion is 75% stenosed.  This is a long segment of disease starting immediately after the large diagonal branch.   Prox LAD lesion is 90% stenosed.   2nd Diag lesion is 80% stenosed.  This is a small vessel which likely could be treated medically.   RPAV lesion is 50% stenosed.   The left ventricular systolic function is normal.   LV end diastolic pressure is normal.   The left ventricular ejection fraction is 55-65% by visual estimate.   There is no aortic valve stenosis.   Severe, calcific disease most notably in the proximal and mid LAD.  The long segment of disease in the mid LAD involves several diagonal vessel ostia.  Moderate disease in the RCA.  We will plan for cardiac surgery referral for consideration of robotic mid CAB procedure with LIMA to LAD.  I suspect his symptoms would resolve with just that revascularization.  If symptoms persisted, the RCA is certainly treatable percutaneously and a hybrid approach to his revascularization could be considered.  Severe tortuosity of the right subclavian.  If intervention was needed, would consider using long destination sheath from the right radial to engage the RCA.  Engaging the left main was much easier.  Treatments: surgery:   10/11/2020 Patient:  Phillip Terry Pre-Op Dx: Unstable angina                         Two-vessel coronary artery disease                         Hypertension                         Hyperlipidemia Post-op Dx: Same Procedure: CABG X 3, LIMA LAD, reverse saphenous vein graft to PDA, reverse saphenous vein graft to diagonal  branch. Endoscopic greater saphenous vein harvest on the right     Surgeon and Role:      * Lightfoot, Eliezer Lofts, MD - Primary    *E. Barrett, PA-C- assisting  Disposition: discharged to home is stable condition.   Discharge Exam: Blood pressure (!) 149/88, pulse 94, temperature 98 F (36.7 C), temperature source Oral, resp. rate 18, height 6' (1.829 m), weight 109.7 kg, SpO2 95 %.  General appearance: alert, cooperative, and no distress Neurologic: intact Heart: regular rate and rhythm and monitor shows normal  sinus rhythm. Lungs: Breath sounds clear anterior Abdomen: Soft and nontender Extremities: Well perfused.  EVH incisions intact and dry. Wound: Sternotomy incision is covered with dry dressing.   Discharge Medications:  The patient has been discharged on:   1.Beta Blocker:  Yes [ X  ]                              No   [   ]                              If No, reason:  2.Ace Inhibitor/ARB: Yes [ x  ]                                     No  [   ]                                     If No, reason:   3.Statin:   Yes [ X  ]                  No  [   ]                  If No, reason:  4.Ecasa:  Yes  [ X  ]                  No   [   ]                  If No, reason:  Patient had ACS upon admission:  No  Plavix/P2Y12 inhibitor: Yes [   ]                                      No  [  x ]     Discharge Instructions     Amb Referral to Cardiac Rehabilitation   Complete by: As directed    To [facility]   Diagnosis: CABG   CABG X ___: 3   After initial evaluation and assessments completed: Virtual Based Care may be provided alone or in conjunction with Phase 2 Cardiac Rehab based on patient barriers.: Yes      Allergies as of 10/15/2020   No Known Allergies      Medication List     TAKE these medications    amiodarone 200 MG tablet Commonly known as: PACERONE Take 1 tablet (200 mg total) by mouth 2 (two) times daily.   amLODipine-valsartan 10-320 MG  tablet Commonly known as: EXFORGE Take 1 tablet by mouth at bedtime.   aspirin 325 MG EC tablet Take 1 tablet (325 mg total) by mouth daily. Start taking on: October 16, 2020 What changed:  medication strength how much to take when to take this additional instructions   febuxostat 40 MG tablet Commonly known as: ULORIC Take 40 mg by mouth at bedtime.   fenofibrate 160 MG tablet Take 160 mg by mouth at bedtime.   metoprolol tartrate 50 MG tablet Commonly known as: LOPRESSOR Take 50 mg by mouth in the morning and at bedtime.   nitroGLYCERIN 0.4 MG SL tablet  Commonly known as: NITROSTAT Place 0.4 mg under the tongue every 5 (five) minutes x 3 doses as needed for chest pain.   omega-3 acid ethyl esters 1 g capsule Commonly known as: LOVAZA TAKE 2 CAPSULES BY MOUTH TWICE DAILY   rosuvastatin 20 MG tablet Commonly known as: CRESTOR Take 1 tablet (20 mg total) by mouth at bedtime.   traMADol 50 MG tablet Commonly known as: ULTRAM Take 1 tablet (50 mg total) by mouth every 6 (six) hours as needed for up to 5 days for moderate pain.        Follow-up Information     Corliss Skains, MD Follow up on 10/28/2020.   Specialty: Cardiothoracic Surgery Why: This appointment is virtual at 2:30.  You do not need to come to our office. Contact information: [address] [place] [address] [phone]         Baldo Daub, MD Follow up on 11/17/2020.   Specialties: Cardiology, Radiology Why: Please arrive 15 minutes early for your 3:40am post-hospital cardiology appointment Contact information: [address] [place] [address] [phone]                 Signed: Leary Roca, PA-C  10/15/2020, 11:06 AM

## 2020-10-12 NOTE — Plan of Care (Signed)
  Problem: Education: Goal: Will demonstrate proper wound care and an understanding of methods to prevent future damage Outcome: Progressing   Problem: Activity: Goal: Risk for activity intolerance will decrease Outcome: Progressing   Problem: Cardiac: Goal: Will achieve and/or maintain hemodynamic stability Outcome: Progressing   Problem: Respiratory: Goal: Respiratory status will improve Outcome: Progressing   Problem: Urinary Elimination: Goal: Ability to achieve and maintain adequate renal perfusion and functioning will improve Outcome: Progressing

## 2020-10-12 NOTE — Progress Notes (Signed)
      [address]       [name] [address]             [phone]                 1 Day Post-Op Procedure(s) (LRB): CORONARY ARTERY BYPASS GRAFTING (CABG), ON PUMP, TIMES THREE, USING LEFT INTERNAL MAMMARY ARTERY AND RIGHT ENDOSCOPICALLY HARVESTED GREATER SAPHENOUS VEIN (N/A) TRANSESOPHAGEAL ECHOCARDIOGRAM (TEE) (N/A)   Events: No events _______________________________________________________________ Vitals: BP 130/71   Pulse 77   Temp 99.5 F (37.5 C)   Resp 19   Ht 6' (1.829 m)   Wt 113.5 kg   SpO2 97%   BMI 33.94 kg/m   - Neuro: alert NAD  - Cardiovascular: sinus  Drips: none.   CVP:  [3 mmHg-12 mmHg] 8 mmHg  - Pulm: EWOB   ABG    Component Value Date/Time   PHART 7.365 10/12/2020 0021   PCO2ART 28.4 (L) 10/12/2020 0021   PO2ART 82 (L) 10/12/2020 0021   HCO3 16.1 (L) 10/12/2020 0021   TCO2 17 (L) 10/12/2020 0021   ACIDBASEDEF 8.0 (H) 10/12/2020 0021   O2SAT 95.0 10/12/2020 0021    - Abd: ND - Extremity: warm  .Intake/Output      08/30 0701 08/31 0700 08/31 0701 09/01 0700   P.O.     I.V. (mL/kg) 2646.2 (23.3)    Blood 350    NG/GT 30    IV Piggyback 871.6    Total Intake(mL/kg) 3897.8 (34.3)    Urine (mL/kg/hr) 2600 (1)    Blood 700    Chest Tube 304    Total Output 3604    Net +293.8            _______________________________________________________________ Labs: CBC Latest Ref Rng & Units 10/12/2020 10/12/2020 10/11/2020  WBC 4.0 - 10.5 K/uL 16.5(H) - -  Hemoglobin 13.0 - 17.0 g/dL 15.4 11.6(L) 10.9(L)  Hematocrit 39.0 - 52.0 % 36.5(L) 34.0(L) 32.0(L)  Platelets 150 - 400 K/uL 158 - -   CMP Latest Ref Rng & Units 10/12/2020 10/12/2020 10/11/2020  Glucose 70 - 99 mg/dL 008(Q) - -  BUN 8 - 23 mg/dL 15 - -  Creatinine 7.61 - 1.24 mg/dL 9.50 - -  Sodium 932 - 145 mmol/L 135 143 144  Potassium 3.5 - 5.1 mmol/L 4.0 3.5 3.6  Chloride 98 - 111 mmol/L 109 - -  CO2 22 - 32 mmol/L 18(L) - -  Calcium 8.9 - 10.3 mg/dL 7.7(L)  - -  Total Protein 6.5 - 8.1 g/dL - - -  Total Bilirubin 0.3 - 1.2 mg/dL - - -  Alkaline Phos 38 - 126 U/L - - -  AST 15 - 41 U/L - - -  ALT 0 - 44 U/L - - -    CXR: PV congestion  _______________________________________________________________  Assessment and Plan: POD 1 s/p CABG  Neuro: pain controlled CV: on A/S/BB.  Will remove A line.  Wires out Pulm: continue pulm toilet  Renal: creat stable.  Good uop GI: advancing diet Heme: stable ID: afebrile, will watch WBC Endo: SSI Dispo: possible floor today.   Corliss Skains 10/12/2020 7:24 AM

## 2020-10-12 NOTE — Progress Notes (Signed)
Pt resting comfortably on home CPAP, pt  self administered. Sat 93%, RT will cont to monitor.

## 2020-10-12 NOTE — Progress Notes (Signed)
      [address]       [place] [address]             [phone]      POD # 1 CABG  On CPAP at present, 5L   BP 130/67   Pulse 77   Temp 98.3 F (36.8 C) (Oral)   Resp 14   Ht 6' (1.829 m)   Wt 113.5 kg   SpO2 92%   BMI 33.94 kg/m    Intake/Output Summary (Last 24 hours) at 10/12/2020 1720 Last data filed at 10/12/2020 1600 Gross per 24 hour  Intake 1543.36 ml  Output 1914 ml  Net -370.64 ml   K= 4.4, creatinine 1.11 Hct= 37  Stable POD # 1  Kayla Deshaies C. Dorris Fetch, MD [facility] [phone]

## 2020-10-12 NOTE — Progress Notes (Signed)
  Amiodarone Drug - Drug Interaction Consult Note  Recommendations: No medication changes needed   Amiodarone is metabolized by the cytochrome P450 system and therefore has the potential to cause many drug interactions. Amiodarone has an average plasma half-life of 50 days (range 20 to 100 days).   There is potential for drug interactions to occur several weeks or months after stopping treatment and the onset of drug interactions may be slow after initiating amiodarone.   []  Statins: Increased risk of myopathy. Simvastatin- restrict dose to 20mg  daily. Other statins: counsel patients to report any muscle pain or weakness immediately.  []  Anticoagulants: Amiodarone can increase anticoagulant effect. Consider warfarin dose reduction. Patients should be monitored closely and the dose of anticoagulant altered accordingly, remembering that amiodarone levels take several weeks to stabilize.  []  Antiepileptics: Amiodarone can increase plasma concentration of phenytoin, the dose should be reduced. Note that small changes in phenytoin dose can result in large changes in levels. Monitor patient and counsel on signs of toxicity.  []  Beta blockers: increased risk of bradycardia, AV block and myocardial depression. Sotalol - avoid concomitant use.  []   Calcium channel blockers (diltiazem and verapamil): increased risk of bradycardia, AV block and myocardial depression.  []   Cyclosporine: Amiodarone increases levels of cyclosporine. Reduced dose of cyclosporine is recommended.  []  Digoxin dose should be halved when amiodarone is started.  []  Diuretics: increased risk of cardiotoxicity if hypokalemia occurs.  []  Oral hypoglycemic agents (glyburide, glipizide, glimepiride): increased risk of hypoglycemia. Patient's glucose levels should be monitored closely when initiating amiodarone therapy.   []  Drugs that prolong the QT interval:  Torsades de pointes risk may be increased with concurrent use - avoid  if possible.  Monitor QTc, also keep magnesium/potassium WNL if concurrent therapy can't be avoided.  Antibiotics: e.g. fluoroquinolones, erythromycin.  Antiarrhythmics: e.g. quinidine, procainamide, disopyramide, sotalol.  Antipsychotics: e.g. phenothiazines, haloperidol.   Lithium, tricyclic antidepressants, and methadone.   Pharm.D. CPP, BCPS Clinical Pharmacist [phone] 10/12/2020 10:38 PM

## 2020-10-12 NOTE — Hospital Course (Addendum)
History of Present Illness:  Phillip Terry is a 75 yo male with history of HTN, Hyperlipidemia, OSA, Peripheral neuropathy, and 2V CAD.  He developed complaints of shortness of breath a year ago.  He was worked up and diagnosed with Pulmonary fibrosis and was treated with inhalers.  His symptoms persisted and he underwent a Coronary CT scan which showed evidence concerning for CAD.  He was referred to Cardiology and underwent cardiac catheterization which showed severe LAD disease and moderate RCA stenosis.  It was felt the patient would benefit from surgical intervention and he was referred to [facility] for evaluation.  He saw Dr. Cliffton Asters at which time he admitted to occasional chest pain.  He continued to experience shortness of breath.  It was felt the patient would benefit from coronary bypass grafting procedure.  It was felt this could be done Robotically assisted with LIMA to LAD.  The risks and benefits of the procedure were explained to the patient and he was agreeable to proceed.  Unfortunately, the patient presented to the ED on 8/26 with complaints of chest pain with radiation down his left arm with associated shortness of breath.  He took NTG with relief of symptoms.  Workup in the ED showed negative Troponin level and EKG showed no acute changes from previous EKGs.  He was admitted to Cardiology service for further care.  Hospital Course:  He remained chest pain free in the hospital.  He underwent repeat Echocardiogram which showed preserved EF.  After additional review of his catheterization films it was felt the patient should undergo traditional coronary bypass grafting this admission.  The risks and benefits of the procedure were again explained to the patient and he was agreeable to proceed.  He was taken to the operating room on 10/11/2020.  He underwent CABG x 3 utilizing LIMA to LAD, SVG to PDA, and SVG to Diagonal #2.  He also underwent endoscopic harvest of greater  saphenous vein from his right leg.  He tolerated the procedure without difficulty and was taken to the SICU in stable condition. He was extubated the evening of surgery without difficulty.  He was weaned off Neo-synephrine as hemodynamics allowed.  His arterial lines and pacing wires were removed POD #1.  His chest tube output decreased and they were placed on bulb suction.  These were ultimately removed on 9/1.  He is on aspirin, statin and beta-blocker.  On postop day 2 he did develop atrial fibrillation and has been started on amiodarone drip.  This has chemically converted him back to sinus rhythm and we are also adjusting beta-blocker.  He remained in NSR and his Amiodarone was converted to an oral regimen.  He was volume overloaded and treated with oral Lasix.  He was weaned off oxygen as tolerated.  He used his home CPAP for his history of OSA.  He was felt medically stable for transfer to the progressive care unit on 10/15/2020. [name]

## 2020-10-12 NOTE — Discharge Instructions (Signed)

## 2020-10-13 ENCOUNTER — Inpatient Hospital Stay (HOSPITAL_COMMUNITY): Payer: Medicare PPO

## 2020-10-13 LAB — BASIC METABOLIC PANEL
Anion gap: 8 (ref 5–15)
BUN: 15 mg/dL (ref 8–23)
CO2: 24 mmol/L (ref 22–32)
Calcium: 8.3 mg/dL — ABNORMAL LOW (ref 8.9–10.3)
Chloride: 101 mmol/L (ref 98–111)
Creatinine, Ser: 1.04 mg/dL (ref 0.61–1.24)
GFR, Estimated: >60 mL/min (ref >60)
Glucose, Bld: 133 mg/dL — ABNORMAL HIGH (ref 70–99)
Potassium: 3.8 mmol/L (ref 3.5–5.1)
Sodium: 133 mmol/L — ABNORMAL LOW (ref 135–145)

## 2020-10-13 LAB — CBC
HCT: 36.2 % — ABNORMAL LOW (ref 39.0–52.0)
Hemoglobin: 12.3 g/dL — ABNORMAL LOW (ref 13.0–17.0)
MCH: 30.3 pg (ref 26.0–34.0)
MCHC: 34 g/dL (ref 30.0–36.0)
MCV: 89.2 fL (ref 80.0–100.0)
Platelets: 155 10*3/uL (ref 150–400)
RBC: 4.06 MIL/uL — ABNORMAL LOW (ref 4.22–5.81)
RDW: 13.1 % (ref 11.5–15.5)
WBC: 14.3 10*3/uL — ABNORMAL HIGH (ref 4.0–10.5)
nRBC: 0 % (ref 0.0–0.2)

## 2020-10-13 LAB — GLUCOSE, CAPILLARY
Glucose-Capillary: 163 mg/dL — ABNORMAL HIGH (ref 70–99)
Glucose-Capillary: 131 mg/dL — ABNORMAL HIGH (ref 70–99)
Glucose-Capillary: 147 mg/dL — ABNORMAL HIGH (ref 70–99)
Glucose-Capillary: 130 mg/dL — ABNORMAL HIGH (ref 70–99)
Glucose-Capillary: 107 mg/dL — ABNORMAL HIGH (ref 70–99)
Glucose-Capillary: 120 mg/dL — ABNORMAL HIGH (ref 70–99)

## 2020-10-13 MED ORDER — ORAL CARE MOUTH RINSE
15.0000 mL | Freq: Two times a day (BID) | OROMUCOSAL | Status: DC
  Administered 2020-10-14 (×2): 15 mL via OROMUCOSAL

## 2020-10-13 MED ORDER — POTASSIUM CHLORIDE CRYS ER 20 MEQ PO TBCR
40.0000 meq | EXTENDED_RELEASE_TABLET | Freq: Once | ORAL | Status: AC
  Administered 2020-10-13: 40 meq via ORAL
  Filled 2020-10-13: qty 2

## 2020-10-13 MED ORDER — CHLORHEXIDINE GLUCONATE 0.12 % MT SOLN
15.0000 mL | Freq: Two times a day (BID) | OROMUCOSAL | Status: DC
  Administered 2020-10-14 – 2020-10-15 (×3): 15 mL via OROMUCOSAL
  Filled 2020-10-13 (×2): qty 15

## 2020-10-13 MED ORDER — FUROSEMIDE 10 MG/ML IJ SOLN
40.0000 mg | Freq: Once | INTRAMUSCULAR | Status: AC
  Administered 2020-10-13: 40 mg via INTRAVENOUS

## 2020-10-13 MED ORDER — FUROSEMIDE 10 MG/ML IJ SOLN
40.0000 mg | Freq: Once | INTRAMUSCULAR | Status: AC
  Administered 2020-10-13: 40 mg via INTRAVENOUS
  Filled 2020-10-13: qty 4

## 2020-10-13 MED ORDER — FUROSEMIDE 10 MG/ML IJ SOLN
40.0000 mg | Freq: Once | INTRAMUSCULAR | Status: DC
  Filled 2020-10-13: qty 4

## 2020-10-13 MED ORDER — METOPROLOL TARTRATE 25 MG PO TABS
25.0000 mg | ORAL_TABLET | Freq: Two times a day (BID) | ORAL | Status: DC
  Administered 2020-10-13 – 2020-10-15 (×5): 25 mg via ORAL
  Filled 2020-10-13 (×5): qty 1

## 2020-10-13 MED ORDER — METOPROLOL TARTRATE 25 MG/10 ML ORAL SUSPENSION
25.0000 mg | Freq: Two times a day (BID) | ORAL | Status: DC
  Filled 2020-10-13 (×2): qty 10

## 2020-10-13 NOTE — Progress Notes (Addendum)
EVENING ROUNDS NOTE :     [address]       [place] [address]             [phone]                 2 Days Post-Op Procedure(s) (LRB): CORONARY ARTERY BYPASS GRAFTING (CABG), ON PUMP, TIMES THREE, USING LEFT INTERNAL MAMMARY ARTERY AND RIGHT ENDOSCOPICALLY HARVESTED GREATER SAPHENOUS VEIN (N/A) TRANSESOPHAGEAL ECHOCARDIOGRAM (TEE) (N/A)  Total Length of Stay:  LOS: 6 days  BP (!) 114/95   Pulse 86   Temp 97.7 F (36.5 C)   Resp (!) 22   Ht 6' (1.829 m)   Wt 113.6 kg   SpO2 90%   BMI 33.97 kg/m   .Intake/Output      08/31 0701 09/01 0700 09/01 0701 09/02 0700   P.O. 200    I.V. (mL/kg) 472.9 (4.2) 182.5 (1.6)   Blood     NG/GT     IV Piggyback 392.9 83   Total Intake(mL/kg) 1065.8 (9.4) 265.5 (2.3)   Urine (mL/kg/hr) 1300 (0.5) 1775 (1.9)   Blood     Chest Tube 90    Total Output 1390 1775   Net -324.2 -1509.5           sodium chloride     sodium chloride     sodium chloride 10 mL/hr at 10/13/20 1400   amiodarone 30 mg/hr (10/13/20 1400)   dexmedetomidine (PRECEDEX) IV infusion Stopped (10/11/20 2145)   epinephrine Stopped (10/11/20 1725)   lactated ringers     lactated ringers 20 mL/hr at 10/11/20 1725   lactated ringers Stopped (10/12/20 0819)   milrinone Stopped (10/11/20 1725)   niCARDipine Stopped (10/11/20 1922)   norepinephrine (LEVOPHED) Adult infusion Stopped (10/11/20 1725)   phenylephrine (NEO-SYNEPHRINE) Adult infusion Stopped (10/12/20 1856)   promethazine (PHENERGAN) injection (IM or IVPB) Stopped (10/13/20 0934)     Lab Results  Component Value Date   WBC 14.3 (H) 10/13/2020   HGB 12.3 (L) 10/13/2020   HCT 36.2 (L) 10/13/2020   PLT 155 10/13/2020   GLUCOSE 133 (H) 10/13/2020   CHOL 84 10/09/2020   TRIG 117 10/09/2020   HDL 32 (L) 10/09/2020   LDLCALC 29 10/09/2020   ALT 28 10/07/2020   AST 26 10/07/2020   NA 133 (L) 10/13/2020   K 3.8 10/13/2020   CL 101 10/13/2020   CREATININE 1.04 10/13/2020   BUN 15  10/13/2020   CO2 24 10/13/2020   INR 1.4 (H) 10/11/2020   HGBA1C 6.1 (H) 10/08/2020     Some nausea- stable with zofran and phenargan Intermit AFIB- currently in sinus Ambulated some   Rowe Clack, PA-C    Agree with above.  Hemodynamically stable in sinus rhythm this evening on IV amio 30.

## 2020-10-13 NOTE — Progress Notes (Signed)
      [address]       [place] [address]             [phone]                 2 Days Post-Op Procedure(s) (LRB): CORONARY ARTERY BYPASS GRAFTING (CABG), ON PUMP, TIMES THREE, USING LEFT INTERNAL MAMMARY ARTERY AND RIGHT ENDOSCOPICALLY HARVESTED GREATER SAPHENOUS VEIN (N/A) TRANSESOPHAGEAL ECHOCARDIOGRAM (TEE) (N/A)   Events: Afib overnight back in sinus  _______________________________________________________________ Vitals: BP 132/71   Pulse (!) 117   Temp 98.6 F (37 C) (Oral)   Resp 19   Ht 6' (1.829 m)   Wt 113.6 kg   SpO2 92%   BMI 33.97 kg/m   - Neuro: alert NAD  - Cardiovascular: sinus  Drips: amio    - Pulm: EWOB   ABG    Component Value Date/Time   PHART 7.365 10/12/2020 0021   PCO2ART 28.4 (L) 10/12/2020 0021   PO2ART 82 (L) 10/12/2020 0021   HCO3 16.1 (L) 10/12/2020 0021   TCO2 17 (L) 10/12/2020 0021   ACIDBASEDEF 8.0 (H) 10/12/2020 0021   O2SAT 95.0 10/12/2020 0021    - Abd: ND - Extremity: warm  .Intake/Output      08/31 0701 09/01 0700 09/01 0701 09/02 0700   P.O. 200    I.V. (mL/kg) 472.9 (4.2) 25 (0.2)   Blood     NG/GT     IV Piggyback 392.9 33   Total Intake(mL/kg) 1065.8 (9.4) 58 (0.5)   Urine (mL/kg/hr) 1300 (0.5)    Blood     Chest Tube 90    Total Output 1390    Net -324.2 +58           _______________________________________________________________ Labs: CBC Latest Ref Rng & Units 10/13/2020 10/12/2020 10/12/2020  WBC 4.0 - 10.5 K/uL 14.3(H) 15.2(H) 16.5(H)  Hemoglobin 13.0 - 17.0 g/dL 12.3(L) 12.5(L) 13.0  Hematocrit 39.0 - 52.0 % 36.2(L) 37.2(L) 36.5(L)  Platelets 150 - 400 K/uL 155 145(L) 158   CMP Latest Ref Rng & Units 10/13/2020 10/12/2020 10/12/2020  Glucose 70 - 99 mg/dL 182(X) 937(J) 696(V)  BUN 8 - 23 mg/dL 15 17 15   Creatinine 0.61 - 1.24 mg/dL 8.93 8.10  Sodium 135 - 145 mmol/L 133(L) 133(L) 135  Potassium 3.5 - 5.1 mmol/L 3.8 4.4 4.0  Chloride 98 - 111 mmol/L 101 100 109  CO2  22 - 32 mmol/L 24 25 18(L)  Calcium 8.9 - 10.3 mg/dL 8.3(L) 8.4(L) 7.7(L)  Total Protein 6.5 - 8.1 g/dL - - -  Total Bilirubin 0.3 - 1.2 mg/dL - - -  Alkaline Phos 38 - 126 U/L - - -  AST 15 - 41 U/L - - -  ALT 0 - 44 U/L - - -    CXR: PV congestion  _______________________________________________________________  Assessment and Plan: POD 2 s/p CABG  Neuro: pain controlled CV: on A/S/BB.  On amio.  Continue protocol Pulm: continue pulm toilet  Renal: creat stable.  Good uop.  Diuresing today GI: on diet Heme: stable ID: afebrile, will watch WBC Endo: SSI Dispo: possible floor today.   1.75 10/13/2020 9:02 AM

## 2020-10-14 DIAGNOSIS — Z951 Presence of aortocoronary bypass graft: Secondary | ICD-10-CM

## 2020-10-14 HISTORY — DX: Presence of aortocoronary bypass graft: Z95.1

## 2020-10-14 LAB — BASIC METABOLIC PANEL
Anion gap: 7 (ref 5–15)
BUN: 18 mg/dL (ref 8–23)
CO2: 26 mmol/L (ref 22–32)
Calcium: 8.4 mg/dL — ABNORMAL LOW (ref 8.9–10.3)
Chloride: 99 mmol/L (ref 98–111)
Creatinine, Ser: 1.14 mg/dL (ref 0.61–1.24)
GFR, Estimated: >60 mL/min (ref >60)
Glucose, Bld: 115 mg/dL — ABNORMAL HIGH (ref 70–99)
Potassium: 3.9 mmol/L (ref 3.5–5.1)
Sodium: 132 mmol/L — ABNORMAL LOW (ref 135–145)

## 2020-10-14 LAB — CBC
HCT: 34.9 % — ABNORMAL LOW (ref 39.0–52.0)
Hemoglobin: 11.7 g/dL — ABNORMAL LOW (ref 13.0–17.0)
MCH: 30.2 pg (ref 26.0–34.0)
MCHC: 33.5 g/dL (ref 30.0–36.0)
MCV: 90.2 fL (ref 80.0–100.0)
Platelets: 183 10*3/uL (ref 150–400)
RBC: 3.87 MIL/uL — ABNORMAL LOW (ref 4.22–5.81)
RDW: 13.4 % (ref 11.5–15.5)
WBC: 11.8 10*3/uL — ABNORMAL HIGH (ref 4.0–10.5)
nRBC: 0 % (ref 0.0–0.2)

## 2020-10-14 LAB — TYPE AND SCREEN
ABO/RH(D): O POS
Antibody Screen: NEGATIVE
Unit division: 0
Unit division: 0

## 2020-10-14 LAB — GLUCOSE, CAPILLARY
Glucose-Capillary: 106 mg/dL — ABNORMAL HIGH (ref 70–99)
Glucose-Capillary: 135 mg/dL — ABNORMAL HIGH (ref 70–99)
Glucose-Capillary: 91 mg/dL (ref 70–99)
Glucose-Capillary: 92 mg/dL (ref 70–99)
Glucose-Capillary: 96 mg/dL (ref 70–99)

## 2020-10-14 LAB — BPAM RBC
Blood Product Expiration Date: 202209292359
Blood Product Expiration Date: 202209302359
ISSUE DATE / TIME: 202208291253
ISSUE DATE / TIME: 202208291253
Unit Type and Rh: 5100
Unit Type and Rh: 5100

## 2020-10-14 LAB — MAGNESIUM: Magnesium: 1.9 mg/dL (ref 1.7–2.4)

## 2020-10-14 MED ORDER — SODIUM CHLORIDE 0.9 % IV SOLN: 250.0000 mL | INTRAVENOUS | Status: DC | PRN

## 2020-10-14 MED ORDER — AMIODARONE HCL 200 MG PO TABS
400.0000 mg | ORAL_TABLET | Freq: Two times a day (BID) | ORAL | Status: DC
  Administered 2020-10-14 – 2020-10-15 (×3): 400 mg via ORAL
  Filled 2020-10-14 (×3): qty 2

## 2020-10-14 MED ORDER — POTASSIUM CHLORIDE CRYS ER 20 MEQ PO TBCR
40.0000 meq | EXTENDED_RELEASE_TABLET | Freq: Every day | ORAL | Status: DC
  Administered 2020-10-14 – 2020-10-15 (×2): 40 meq via ORAL
  Filled 2020-10-14 (×2): qty 2

## 2020-10-14 MED ORDER — SODIUM CHLORIDE 0.9% FLUSH
3.0000 mL | Freq: Two times a day (BID) | INTRAVENOUS | Status: DC
  Administered 2020-10-14 – 2020-10-15 (×3): 3 mL via INTRAVENOUS

## 2020-10-14 MED ORDER — FUROSEMIDE 40 MG PO TABS
40.0000 mg | ORAL_TABLET | Freq: Every day | ORAL | Status: DC
  Administered 2020-10-14 – 2020-10-15 (×2): 40 mg via ORAL
  Filled 2020-10-14 (×2): qty 1

## 2020-10-14 MED ORDER — SODIUM CHLORIDE 0.9% FLUSH: 3.0000 mL | INTRAVENOUS | Status: DC | PRN

## 2020-10-14 MED ORDER — ~~LOC~~ CARDIAC SURGERY, PATIENT & FAMILY EDUCATION: Freq: Once | Status: AC

## 2020-10-14 MED ORDER — INSULIN ASPART 100 UNIT/ML IJ SOLN
0.0000 [IU] | Freq: Three times a day (TID) | INTRAMUSCULAR | Status: DC
Start: 2020-10-14 — End: 2020-10-15
  Administered 2020-10-14: 2 [IU] via SUBCUTANEOUS

## 2020-10-14 NOTE — Progress Notes (Signed)
      [address]       [name] [address]             [phone]      Evening Rounds:  Stable today, walked in the hall twice. On RA. Maintaining sinus rhythm on oral amiodarone. Awaiting transfer to 4E.  Gaynelle Arabian, PA-C

## 2020-10-14 NOTE — Progress Notes (Signed)
CARDIAC REHAB PHASE I   PRE:  Rate/Rhythm: 84 SR    BP: sitting 150/84    SaO2: 98-100 RA  MODE:  Ambulation: 740 ft   POST:  Rate/Rhythm: 103 ST    BP: sitting 148/77     SaO2: 95 RA  Pt stood with verbal cues and walked with RW. Slow and steady. No O2 needed. Tired after walk but feels good. To recliner, 1750 ml on IS. Left off O2.  [phone]   Harriet Masson CES, ACSM 10/14/2020 3:24 PM

## 2020-10-14 NOTE — Plan of Care (Signed)

## 2020-10-14 NOTE — Progress Notes (Signed)
Pt just ambulated with RN. Discussed with pt and wife IS, sternal precautions, exercise, and CRPII. Receptive. Gave them diet sheet to review and can discuss more later. Will refer to [place] CRPII. Will continue to follow for education. [phone] Ethelda Chick CES, ACSM 1:38 PM 10/14/2020

## 2020-10-14 NOTE — Progress Notes (Addendum)
[facility] DAILY ICU PROGRESS NOTE                   [address]            [place] [address]          [phone]   3 Days Post-Op Procedure(s) (LRB): CORONARY ARTERY BYPASS GRAFTING (CABG), ON PUMP, TIMES THREE, USING LEFT INTERNAL MAMMARY ARTERY AND RIGHT ENDOSCOPICALLY HARVESTED GREATER SAPHENOUS VEIN (N/A) TRANSESOPHAGEAL ECHOCARDIOGRAM (TEE) (N/A)  Total Length of Stay:  LOS: 7 days   Subjective: Awake and alert, up in bedside chair.  Feels he is progressing.  No new complaints or concerns.  Objective: Vital signs in last 24 hours: Temp:  [97.7 F (36.5 C)-98.6 F (37 C)] 98.5 F (36.9 C) (09/02 0820) Pulse Rate:  [81-86] 81 (09/01 2223) Cardiac Rhythm: Normal sinus rhythm (09/02 0000) Resp:  [13-28] 19 (09/02 0600) BP: (100-171)/(59-95) 146/74 (09/02 0600) SpO2:  [90 %-95 %] 95 % (09/02 0600) Weight:  [110 kg] 110 kg (09/02 0500)  Filed Weights   10/12/20 0600 10/13/20 0500 10/14/20 0500  Weight: 113.5 kg 113.6 kg 110 kg    Weight change: -3.6 kg      Intake/Output from previous day: 09/01 0701 - 09/02 0700 In: 833.6 [I.V.:600.6; IV Piggyback:233] Out: 4510 [Urine:4500; Chest Tube:10]  Intake/Output this shift: No intake/output data recorded.  Current Meds: Scheduled Meds:  acetaminophen  1,000 mg Oral Q6H   Or   acetaminophen (TYLENOL) oral liquid 160 mg/5 mL  1,000 mg Per Tube Q6H   amiodarone  400 mg Oral BID   aspirin EC  325 mg Oral Daily   Or   aspirin  324 mg Per Tube Daily   bisacodyl  10 mg Oral Daily   Or   bisacodyl  10 mg Rectal Daily   chlorhexidine  15 mL Mouth Rinse BID   Chlorhexidine Gluconate Cloth  6 each Topical Daily   [facility], Patient & Family Education   Does not apply Once   docusate sodium  200 mg Oral Daily   enoxaparin (LOVENOX) injection  30 mg Subcutaneous QHS   febuxostat  40 mg Oral QHS   fenofibrate  160 mg Oral QHS   furosemide  40 mg Oral Daily   insulin aspart  0-24 Units  Subcutaneous TID WC & HS   mouth rinse  15 mL Mouth Rinse q12n4p   metoprolol tartrate  25 mg Oral BID   Or   metoprolol tartrate  25 mg Per Tube BID   omega-3 acid ethyl esters  2 g Oral BID   pantoprazole  40 mg Oral Daily   potassium chloride  40 mEq Oral Daily   rosuvastatin  20 mg Oral QHS   sodium chloride flush  10-40 mL Intracatheter Q12H   sodium chloride flush  3 mL Intravenous Q12H   sodium chloride flush  3 mL Intravenous Q12H   Continuous Infusions:  sodium chloride     sodium chloride     sodium chloride 10 mL/hr at 10/14/20 0600   sodium chloride     dexmedetomidine (PRECEDEX) IV infusion Stopped (10/11/20 2145)   lactated ringers     lactated ringers 20 mL/hr at 10/11/20 1725   lactated ringers Stopped (10/12/20 0819)   promethazine (PHENERGAN) injection (IM or IVPB) Stopped (10/13/20 1548)   PRN Meds:.sodium chloride, sodium chloride, lactated ringers, metoprolol tartrate, morphine injection, ondansetron (ZOFRAN) IV, oxyCODONE, promethazine (PHENERGAN) injection (IM or IVPB), sodium  chloride flush, sodium chloride flush, sodium chloride flush, traMADol  General appearance: alert, cooperative, and no distress Neurologic: intact Heart: regular rate and rhythm and monitor shows normal sinus rhythm. Lungs: Breath sounds clear anterior Abdomen: Soft and nontender Extremities: Well perfused.  EVH incisions intact and dry. Wound: Sternotomy incision is covered with dry dressing.  Lab Results: CBC: Recent Labs    10/13/20 0301 10/14/20 0329  WBC 14.3* 11.8*  HGB 12.3* 11.7*  HCT 36.2* 34.9*  PLT 155 183   BMET:  Recent Labs    10/13/20 0301 10/14/20 0329  NA 133* 132*  K 3.8 3.9  CL 101 99  CO2 24 26  GLUCOSE 133* 115*  BUN 15 18  CREATININE 1.04 1.14  CALCIUM 8.3* 8.4*    CMET: Lab Results  Component Value Date   WBC 11.8 (H) 10/14/2020   HGB 11.7 (L) 10/14/2020   HCT 34.9 (L) 10/14/2020   PLT 183 10/14/2020   GLUCOSE 115 (H) 10/14/2020    CHOL 84 10/09/2020   TRIG 117 10/09/2020   HDL 32 (L) 10/09/2020   LDLCALC 29 10/09/2020   ALT 28 10/07/2020   AST 26 10/07/2020   NA 132 (L) 10/14/2020   K 3.9 10/14/2020   CL 99 10/14/2020   CREATININE 1.14 10/14/2020   BUN 18 10/14/2020   CO2 26 10/14/2020   INR 1.4 (H) 10/11/2020   HGBA1C 6.1 (H) 10/08/2020      PT/INR:  Recent Labs    10/11/20 1730  LABPROT 16.8*  INR 1.4*   Radiology: No results found.   Assessment/Plan: S/P Procedure(s) (LRB): CORONARY ARTERY BYPASS GRAFTING (CABG), ON PUMP, TIMES THREE, USING LEFT INTERNAL MAMMARY ARTERY AND RIGHT ENDOSCOPICALLY HARVESTED GREATER SAPHENOUS VEIN (N/A) TRANSESOPHAGEAL ECHOCARDIOGRAM (TEE) (N/A)  -CV-postop day 3 CABG x3.  Normal EF preop.  Remains hemodynamically stable.  On ASA, metoprolol, rosuvastatin.  Had postop A. fib managed with amiodarone with conversion back to sinus rhythm.  Transition to oral amiodarone today.  -Pulm-continues to require O2 via nasal cannula at 4 to 5 L/min.  Using CPAP at night.  Chest x-ray stable mild bibasilar atelectasis.  Continue working on pulmonary hygiene, continue CPAP for his history of obstructive sleep apnea  -GI-tolerating p.o.'s, no bowel movement yet.  Continue bowel regimen and advance activity  -Renal-renal function normal.  He was diuresed well, current weight is equivalent to preop weight but will continue daily Lasix 40 mg p.o. daily since he still has some hypoxia  -HEME-mild expected acute blood loss anemia, stable.  Monitor.  -DVT PPx-continue enoxaparin  -Disposition-plan transfer to 4 E. progressive care today    Leary Roca, PA-C [phone] 10/14/2020 9:14 AM   Agree with above. Continue diuresis. Transfer to floor.  Awaiting bed.  Lesly Pontarelli Keane Scrape

## 2020-10-15 LAB — GLUCOSE, CAPILLARY
Glucose-Capillary: 112 mg/dL — ABNORMAL HIGH (ref 70–99)
Glucose-Capillary: 110 mg/dL — ABNORMAL HIGH (ref 70–99)

## 2020-10-15 LAB — CBC
HCT: 32.8 % — ABNORMAL LOW (ref 39.0–52.0)
Hemoglobin: 11.3 g/dL — ABNORMAL LOW (ref 13.0–17.0)
MCH: 30.6 pg (ref 26.0–34.0)
MCHC: 34.5 g/dL (ref 30.0–36.0)
MCV: 88.9 fL (ref 80.0–100.0)
Platelets: 193 10*3/uL (ref 150–400)
RBC: 3.69 MIL/uL — ABNORMAL LOW (ref 4.22–5.81)
RDW: 13.1 % (ref 11.5–15.5)
WBC: 8.7 10*3/uL (ref 4.0–10.5)
nRBC: 0 % (ref 0.0–0.2)

## 2020-10-15 LAB — BASIC METABOLIC PANEL
Anion gap: 5 (ref 5–15)
BUN: 19 mg/dL (ref 8–23)
CO2: 28 mmol/L (ref 22–32)
Calcium: 8.5 mg/dL — ABNORMAL LOW (ref 8.9–10.3)
Chloride: 101 mmol/L (ref 98–111)
Creatinine, Ser: 1.13 mg/dL (ref 0.61–1.24)
GFR, Estimated: >60 mL/min (ref >60)
Glucose, Bld: 103 mg/dL — ABNORMAL HIGH (ref 70–99)
Potassium: 4.1 mmol/L (ref 3.5–5.1)
Sodium: 134 mmol/L — ABNORMAL LOW (ref 135–145)

## 2020-10-15 MED ORDER — TRAMADOL HCL 50 MG PO TABS: 50.0000 mg | ORAL_TABLET | Freq: Four times a day (QID) | ORAL | 0 refills | Status: AC | PRN

## 2020-10-15 MED ORDER — AMIODARONE HCL 200 MG PO TABS: 200.0000 mg | ORAL_TABLET | Freq: Two times a day (BID) | ORAL | 1 refills | Status: DC

## 2020-10-15 MED ORDER — ASPIRIN 325 MG PO TBEC: 325.0000 mg | DELAYED_RELEASE_TABLET | Freq: Every day | ORAL | 0 refills | Status: DC

## 2020-10-15 MED FILL — Heparin Sodium (Porcine) Inj 1000 Unit/ML: INTRAMUSCULAR | Qty: 30 | Status: AC

## 2020-10-15 MED FILL — Sodium Chloride IV Soln 0.9%: INTRAVENOUS | Qty: 2000 | Status: AC

## 2020-10-15 MED FILL — Sodium Bicarbonate IV Soln 8.4%: INTRAVENOUS | Qty: 50 | Status: AC

## 2020-10-15 MED FILL — Potassium Chloride Inj 2 mEq/ML: INTRAVENOUS | Qty: 40 | Status: AC

## 2020-10-15 MED FILL — Heparin Sodium (Porcine) Inj 1000 Unit/ML: INTRAMUSCULAR | Qty: 10 | Status: AC

## 2020-10-15 MED FILL — Mannitol IV Soln 20%: INTRAVENOUS | Qty: 500 | Status: AC

## 2020-10-15 MED FILL — Lidocaine HCl Local Preservative Free (PF) Inj 2%: INTRAMUSCULAR | Qty: 15 | Status: AC

## 2020-10-15 MED FILL — Calcium Chloride Inj 10%: INTRAVENOUS | Qty: 10 | Status: AC

## 2020-10-15 MED FILL — Electrolyte-R (PH 7.4) Solution: INTRAVENOUS | Qty: 3000 | Status: AC

## 2020-10-15 NOTE — Progress Notes (Signed)
CARDIAC REHAB PHASE I   PRE:  Rate/Rhythm: 99 SR    BP: sitting 143/87    SaO2: 99 RA  MODE:  Ambulation: 450 ft   POST:  Rate/Rhythm: 111 ST    BP: sitting 162/94     SaO2: 95 RA  [id number] Patient ambulated in hallway independently with standby assist. Denied complaints. Steady gait. To be discharged today. Nutrition education reviewed. Denied need for OHS education reinforcement. Phase 2 CR referral to [place] previously placed.    Taiana Temkin Hoover Brunette RN, BSN

## 2020-10-15 NOTE — Progress Notes (Signed)
Mobility Specialist: Progress Note   10/15/20 1222  Mobility  Activity Ambulated in hall  Level of Assistance Minimal assist, patient does 75% or more  Assistive Device None  Distance Ambulated (ft) 410 ft  Mobility Ambulated independently in hallway  Mobility Response Tolerated well  Mobility performed by Mobility specialist  Bed Position Chair  $Mobility charge 1 Mobility   During Mobility: 89 HR, 95% SpO2 Post-Mobility: 79 HR  Pt to BR to attempt BM and then agreeable to ambulation. Pt required minA to sit EOB from supine and was independent to stand as well as during ambulation. Pt c/o feeling SOB during ambulation, otherwise asx. Pt to recliner after walk with call bell in reach.   [facility] Skylen Spiering Mobility Specialist Mobility Specialist Phone: [phone]

## 2020-10-15 NOTE — Progress Notes (Signed)
T[facility] DAILY ICU PROGRESS NOTE                   [address]            [place] [id number]          [phone]   4 Days Post-Op Procedure(s) (LRB): CORONARY ARTERY BYPASS GRAFTING (CABG), ON PUMP, TIMES THREE, USING LEFT INTERNAL MAMMARY ARTERY AND RIGHT ENDOSCOPICALLY HARVESTED GREATER SAPHENOUS VEIN (N/A) TRANSESOPHAGEAL ECHOCARDIOGRAM (TEE) (N/A)  Total Length of Stay:  LOS: 8 days   Subjective: Transferred from 2H yesterday. Feels good, walked in the hall and feels he is independent with mobility now.  No new complaints or concerns. Tolerating PO and had a BM.  Objective: Vital signs in last 24 hours: Temp:  [98 F (36.7 C)-98.4 F (36.9 C)] 98 F (36.7 C) (09/03 0903) Pulse Rate:  [74-94] 94 (09/03 0903) Cardiac Rhythm: Normal sinus rhythm (09/03 0700) Resp:  [17-21] 18 (09/03 0903) BP: (101-149)/(65-88) 149/88 (09/03 0903) SpO2:  [90 %-95 %] 95 % (09/03 0903) Weight:  [109.7 kg-110 kg] 109.7 kg (09/03 0526)  Filed Weights   10/14/20 0500 10/15/20 0503 10/15/20 0526  Weight: 110 kg 110 kg 109.7 kg       Intake/Output from previous day: 09/02 0701 - 09/03 0700 In: 803.5 [P.O.:720; I.V.:83.5] Out: 2125 [Urine:2125]  Intake/Output this shift: Total I/O In: 240 [P.O.:240] Out: 375 [Urine:375]  Current Meds: Scheduled Meds:  acetaminophen  1,000 mg Oral Q6H   Or   acetaminophen (TYLENOL) oral liquid 160 mg/5 mL  1,000 mg Per Tube Q6H   amiodarone  400 mg Oral BID   aspirin EC  325 mg Oral Daily   Or   aspirin  324 mg Per Tube Daily   bisacodyl  10 mg Oral Daily   Or   bisacodyl  10 mg Rectal Daily   chlorhexidine  15 mL Mouth Rinse BID   Chlorhexidine Gluconate Cloth  6 each Topical Daily   docusate sodium  200 mg Oral Daily   enoxaparin (LOVENOX) injection  30 mg Subcutaneous QHS   febuxostat  40 mg Oral QHS   fenofibrate  160 mg Oral QHS   furosemide  40 mg Oral Daily   insulin aspart  0-24 Units Subcutaneous TID WC & HS   mouth  rinse  15 mL Mouth Rinse q12n4p   metoprolol tartrate  25 mg Oral BID   Or   metoprolol tartrate  25 mg Per Tube BID   omega-3 acid ethyl esters  2 g Oral BID   pantoprazole  40 mg Oral Daily   potassium chloride  40 mEq Oral Daily   rosuvastatin  20 mg Oral QHS   sodium chloride flush  10-40 mL Intracatheter Q12H   sodium chloride flush  3 mL Intravenous Q12H   sodium chloride flush  3 mL Intravenous Q12H   Continuous Infusions:  sodium chloride     sodium chloride     sodium chloride Stopped (10/14/20 0937)   sodium chloride     lactated ringers     lactated ringers 20 mL/hr at 10/11/20 1725   lactated ringers Stopped (10/12/20 0819)   promethazine (PHENERGAN) injection (IM or IVPB) Stopped (10/13/20 1548)   PRN Meds:.sodium chloride, sodium chloride, lactated ringers, metoprolol tartrate, morphine injection, ondansetron (ZOFRAN) IV, oxyCODONE, promethazine (PHENERGAN) injection (IM or IVPB), sodium chloride flush, sodium chloride flush, sodium chloride flush, traMADol  General appearance: alert, cooperative, and no distress Neurologic: intact  Heart: regular rate and rhythm and monitor shows normal sinus rhythm. Lungs: Breath sounds clear anterior Abdomen: Soft and nontender Extremities: Well perfused.  EVH incisions intact and dry. Wound: Sternotomy incision is covered with dry dressing.  Lab Results: CBC: Recent Labs    10/14/20 0329 10/15/20 0046  WBC 11.8* 8.7  HGB 11.7* 11.3*  HCT 34.9* 32.8*  PLT 183 193    BMET:  Recent Labs    10/14/20 0329 10/15/20 0046  NA 132* 134*  K 3.9 4.1  CL 99 101  CO2 26 28  GLUCOSE 115* 103*  BUN 18 19  CREATININE 1.14 1.13  CALCIUM 8.4* 8.5*     CMET: Lab Results  Component Value Date   WBC 8.7 10/15/2020   HGB 11.3 (L) 10/15/2020   HCT 32.8 (L) 10/15/2020   PLT 193 10/15/2020   GLUCOSE 103 (H) 10/15/2020   CHOL 84 10/09/2020   TRIG 117 10/09/2020   HDL 32 (L) 10/09/2020   LDLCALC 29 10/09/2020   ALT 28  10/07/2020   AST 26 10/07/2020   NA 134 (L) 10/15/2020   K 4.1 10/15/2020   CL 101 10/15/2020   CREATININE 1.13 10/15/2020   BUN 19 10/15/2020   CO2 28 10/15/2020   INR 1.4 (H) 10/11/2020   HGBA1C 6.1 (H) 10/08/2020      PT/INR:  No results for input(s): LABPROT, INR in the last 72 hours.  Radiology: No results found.   Assessment/Plan: S/P Procedure(s) (LRB): CORONARY ARTERY BYPASS GRAFTING (CABG), ON PUMP, TIMES THREE, USING LEFT INTERNAL MAMMARY ARTERY AND RIGHT ENDOSCOPICALLY HARVESTED GREATER SAPHENOUS VEIN (N/A) TRANSESOPHAGEAL ECHOCARDIOGRAM (TEE) (N/A)  -CV-postop day 4 CABG x3.  Normal EF preop.  Remains hemodynamically stable.  On ASA, metoprolol, rosuvastatin.  Had postop A. fib managed with amiodarone with conversion back to sinus rhythm.  Maintaining SR on oral amiodarone.  -Pulm-On RA with acceptable sats.  Using CPAP at night for OSA    -GI-tolerating p.o.'s, normal bowel movement   -Renal-renal function normal.    -HEME-mild expected acute blood loss anemia, stable.   -Disposition-discharge today on oral amiodarone, ASA, metoprolol,    Leary Roca, PA-C [phone] 10/15/2020 10:43 AM

## 2020-10-15 NOTE — Progress Notes (Signed)
Pt discharged to home with family.  Pt taken off telemetry and CCMD notified.  Pt's IV removed.  Pt left with all of their personal belongings.  AVS documentation reviewed and sent home with Pt and all questions answered.     

## 2020-10-18 ENCOUNTER — Other Ambulatory Visit: Payer: Medicare PPO

## 2020-10-18 ENCOUNTER — Other Ambulatory Visit: Payer: Self-pay | Admitting: *Deleted

## 2020-10-18 MED ORDER — ONDANSETRON HCL 4 MG PO TABS: 4.0000 mg | ORAL_TABLET | Freq: Three times a day (TID) | ORAL | 0 refills | Status: DC | PRN

## 2020-10-18 NOTE — Progress Notes (Signed)
[name] contacted the office stating patient has been nauseous since being d/c'ed from the hospital s/p CABG 8/30 by Dr. Cliffton Asters. Per wife, the nausea comes and goes throughout the day. He is able to keep down fluids and food although he hasn't been eating as much due to the nausea. Patient is reporting a weight loss from 242-225lbs. Patient states he is having normal BM's. Per T. Asa Lente, [place], Zofran sent into patient's preferred pharmacy. Wife states she doesn't believe Zofran works but they are willing to give it another try in addition to a BRAT diet. All questions answered, patient advised to call with any other concerns.

## 2020-10-20 ENCOUNTER — Other Ambulatory Visit (HOSPITAL_COMMUNITY): Payer: Medicare PPO

## 2020-10-20 ENCOUNTER — Ambulatory Visit (HOSPITAL_COMMUNITY): Payer: Medicare PPO

## 2020-10-21 ENCOUNTER — Other Ambulatory Visit (HOSPITAL_COMMUNITY): Payer: Medicare PPO

## 2020-10-21 ENCOUNTER — Encounter (HOSPITAL_COMMUNITY): Payer: Medicare PPO

## 2020-10-24 ENCOUNTER — Inpatient Hospital Stay: Admit: 2020-10-24 | Payer: Medicare PPO | Admitting: Thoracic Surgery (Cardiothoracic Vascular Surgery)

## 2020-10-24 SURGERY — CORONARY ARTERY BYPASS GRAFTING (CABG), ROBOT ASSISTED
Anesthesia: General

## 2020-10-26 DIAGNOSIS — K760 Fatty (change of) liver, not elsewhere classified: Secondary | ICD-10-CM | POA: Insufficient documentation

## 2020-10-27 ENCOUNTER — Telehealth: Payer: Self-pay

## 2020-10-27 NOTE — Telephone Encounter (Signed)
[name] contacted the office concerned about patient's continued nausea despite taking Zofran. She states that it constipates him and does not help with his nausea. She asked if there was an alternative medication besides Amiodarone that would help with his rhythm as it is felt that this medication which is new since discharge from the hospital. Advised that patient does have virtual appointment with Dr. Cliffton Asters tomorrow and she could discuss it with him then. She is aware of appointment and stated that she would. Dr. Cliffton Asters made aware of medication side effects and wife's suggestions.

## 2020-10-28 ENCOUNTER — Other Ambulatory Visit: Payer: Self-pay

## 2020-10-28 ENCOUNTER — Ambulatory Visit (INDEPENDENT_AMBULATORY_CARE_PROVIDER_SITE_OTHER): Payer: Self-pay | Admitting: Thoracic Surgery (Cardiothoracic Vascular Surgery)

## 2020-10-28 DIAGNOSIS — I25119 Atherosclerotic heart disease of native coronary artery with unspecified angina pectoris: Secondary | ICD-10-CM

## 2020-10-28 MED ORDER — PROMETHAZINE HCL 12.5 MG PO TABS: 12.5000 mg | ORAL_TABLET | Freq: Four times a day (QID) | ORAL | 0 refills | Status: DC | PRN

## 2020-10-28 NOTE — Progress Notes (Signed)
     [address]       [place] [address]             [phone]       Patient: Home Provider: Office Consent for Telemedicine visit obtained.  Today's visit was completed via a real-time telehealth (see specific modality noted below). The patient/authorized person provided oral consent at the time of the visit to engage in a telemedicine encounter with the present provider at [facility]. The patient/authorized person was informed of the potential benefits, limitations, and risks of telemedicine. The patient/authorized person expressed understanding that the laws that protect confidentiality also apply to telemedicine. The patient/authorized person acknowledged understanding that telemedicine does not provide emergency services and that he or she would need to call 911 or proceed to the nearest hospital for help if such a need arose.   Total time spent in the clinical discussion 10 minutes.  Telehealth Modality: Phone visit (audio only)  I had a telephone visit with Phillip Terry.  He is s/p CABG.  He complains of nausea when he is constipated.  He is not ambulating much.  He denies any significant pain.  His blood pressure has been stable.  Pt will f/u in 1 month with CXR.  Kenzleigh Sedam Keane Scrape

## 2020-10-31 DIAGNOSIS — M109 Gout, unspecified: Secondary | ICD-10-CM | POA: Diagnosis not present

## 2020-10-31 DIAGNOSIS — R110 Nausea: Secondary | ICD-10-CM | POA: Diagnosis not present

## 2020-11-11 ENCOUNTER — Ambulatory Visit (INDEPENDENT_AMBULATORY_CARE_PROVIDER_SITE_OTHER): Payer: Medicare PPO

## 2020-11-11 ENCOUNTER — Other Ambulatory Visit: Payer: Self-pay

## 2020-11-11 ENCOUNTER — Other Ambulatory Visit: Payer: Self-pay | Admitting: Physician Assistant

## 2020-11-11 ENCOUNTER — Encounter: Payer: Self-pay | Admitting: Cardiology

## 2020-11-11 ENCOUNTER — Ambulatory Visit: Payer: Medicare PPO | Admitting: Cardiology

## 2020-11-11 VITALS — BP 120/62 | HR 54 | Ht 72.0 in | Wt 242.0 lb

## 2020-11-11 DIAGNOSIS — R931 Abnormal findings on diagnostic imaging of heart and coronary circulation: Secondary | ICD-10-CM

## 2020-11-11 DIAGNOSIS — I10 Essential (primary) hypertension: Secondary | ICD-10-CM

## 2020-11-11 DIAGNOSIS — I480 Paroxysmal atrial fibrillation: Secondary | ICD-10-CM

## 2020-11-11 DIAGNOSIS — Z79899 Other long term (current) drug therapy: Secondary | ICD-10-CM | POA: Diagnosis not present

## 2020-11-11 DIAGNOSIS — I25118 Atherosclerotic heart disease of native coronary artery with other forms of angina pectoris: Secondary | ICD-10-CM

## 2020-11-11 DIAGNOSIS — J8410 Pulmonary fibrosis, unspecified: Secondary | ICD-10-CM

## 2020-11-11 DIAGNOSIS — Z951 Presence of aortocoronary bypass graft: Secondary | ICD-10-CM | POA: Diagnosis not present

## 2020-11-11 DIAGNOSIS — E785 Hyperlipidemia, unspecified: Secondary | ICD-10-CM

## 2020-11-11 NOTE — Patient Instructions (Signed)

## 2020-11-11 NOTE — Addendum Note (Signed)
Addended by: Delorse Limber I on: 11/11/2020 04:15 PM   Modules accepted: Orders

## 2020-11-11 NOTE — Progress Notes (Signed)
Cardiology Office Note:    Date:  11/11/2020   ID:  Phillip Terry, DOB 02-Jun-1945, MRN [id number]  PCP:  Paulina Fusi, MD  Cardiologist:  Norman Herrlich, MD    Referring MD: Paulina Fusi, MD    ASSESSMENT:    1. Coronary artery disease of native artery of native heart with stable angina pectoris (HCC)   2. S/P CABG x 3   3. PAF (paroxysmal atrial fibrillation) (HCC)   4. On amiodarone therapy   5. High coronary artery calcium score   6. Pulmonary fibrosis (HCC)   7. Essential hypertension   8. Hyperlipidemia, unspecified hyperlipidemia type    PLAN:    In order of problems listed above:  Is making a good recovery from his bypass surgery will enroll in cardiac rehabilitation convert to low-dose aspirin in 90 days and continue his beta-blocker antihypertensives and lipid-lowering with rosuvastatin. He had postoperative atrial fibrillation order to contact him and tell him to stop taking amiodarone wait 2 weeks and we will apply an event monitor at that time he is not anticoagulated And require aggressive lipid-lowering he is on a high intensity statin fenofibrate and goal LDL should be less than 70 and ideally less than 55.[name]  His most recent lipid profile 10/09/2020 shows an LDL of 29 cholesterol 84 HDL 32 triglycerides 117 A1c 6.1% hemoglobin 11.3 creatinine 1.13 Stable pulmonary fibrosis BP at target continue current treatment including ARB amlodipine   Next appointment: 3 months   Medication Adjustments/Labs and Tests Ordered: Current medicines are reviewed at length with the patient today.  Concerns regarding medicines are outlined above.  No orders of the defined types were placed in this encounter.  No orders of the defined types were placed in this encounter.   Chief Complaint  Patient presents with   Follow-up    History of Present Illness:    Phillip Terry is a 75 y.o. male with a hx of idiopathic pulmonary fibrosis hypertension hyperlipidemia and coronary  artery calcification last seen 09/16/2020 with a rapidly accelerating pattern of typical angina and a cardiac CTA showing a very high calcium score of 2605 and multivessel calcification and plaque limiting the ability to evaluate the lumen and the degree of stenosis and referred for coronary angiography. Compliance with diet, lifestyle and medications: Yes  He is making good steady recovery from his bypass surgery he is complaining of what is best described as dysesthesia in the left chest wall from stripping the left thoracic artery he is having very little sternal pain and he slowly increase activities at home and wants to enroll in cardiac rehabilitation.  He has had no fever chills or drainage of his wounds not having shortness of breath edema.  He underwent left heart catheterization 09/23/2020 multivessel CAD vessel CAD and referred for CABG. He had elective CABG 10/11/2020 with a left thoracic artery anastomosed to the left anterior descending coronary artery saphenous vein graft to the PDA and saphenous vein graft #2 to the diagonal branch.  In postoperative course was noted for atrial fibrillation placed on amiodarone and converted to sinus rhythm and placed on oral amiodarone.  He had volume overloaded state and required IV diuretic he remained in sinus rhythm and was discharged postoperative day 4. Coronary Findings  Diagnostic Dominance: Right Left Anterior Descending  The vessel exhibits minimal luminal irregularities.  Prox LAD lesion is 90% stenosed. The lesion is severely calcified.  Mid LAD lesion is 75% stenosed. The lesion is severely calcified.  Second  Diagonal Branch  Vessel is small in size.  2nd Diag lesion is 80% stenosed.  Left Circumflex  Dist Cx lesion is 50% stenosed.  Right Coronary Artery  Prox RCA to Mid RCA lesion is 60% stenosed.  Right Posterior Atrioventricular Artery  RPAV lesion is 50% stenosed.  Intervention  No interventions have been documented. Wall  Motion              Left Heart  Left Ventricle The left ventricular size is normal. The left ventricular systolic function is normal. LV end diastolic pressure is normal. The left ventricular ejection fraction is 55-65% by visual estimate. No regional wall motion abnormalities.  Aortic Valve There is no aortic valve stenosis.   Coronary Diagrams   Diagnostic Dominance: Right      Past Medical History:  Diagnosis Date   CAD in native artery    Calculus of kidney    Essential hypertension    Hepatic steatosis    Hyperlipidemia    OSA on CPAP 03/16/2007   RDI 39 Sat 89%   Peripheral neuropathy    Pulmonary fibrosis (HCC) 10/27/2019    Past Surgical History:  Procedure Laterality Date   CATARACT EXTRACTION Bilateral    CORONARY ARTERY BYPASS GRAFT N/A 10/11/2020   Procedure: CORONARY ARTERY BYPASS GRAFTING (CABG), ON PUMP, TIMES THREE, USING LEFT INTERNAL MAMMARY ARTERY AND RIGHT ENDOSCOPICALLY HARVESTED GREATER SAPHENOUS VEIN;  Surgeon: Corliss Skains, MD;  Location: [facility] OR;  Service: Open Heart Surgery;  Laterality: N/A;   FOOT SURGERY Left 2012   gastronemius slide procedure   LEFT HEART CATH AND CORONARY ANGIOGRAPHY N/A 09/23/2020   Procedure: LEFT HEART CATH AND CORONARY ANGIOGRAPHY;  Surgeon: Corky Crafts, MD;  Location: [facility] INVASIVE CV LAB;  Service: Cardiovascular;  Laterality: N/A;   LEG SURGERY Left 2012   Gastronemius slide procedure   TEE WITHOUT CARDIOVERSION N/A 10/11/2020   Procedure: TRANSESOPHAGEAL ECHOCARDIOGRAM (TEE);  Surgeon: Corliss Skains, MD;  Location: [facility] OR;  Service: Open Heart Surgery;  Laterality: N/A;   TONSILLECTOMY      Current Medications: Current Meds  Medication Sig   amiodarone (PACERONE) 200 MG tablet Take 1 tablet (200 mg total) by mouth 2 (two) times daily.   amLODipine-valsartan (EXFORGE) 10-320 MG tablet Take 1 tablet by mouth at bedtime.   aspirin EC 325 MG EC tablet Take 1 tablet (325 mg total) by mouth  daily.   febuxostat (ULORIC) 40 MG tablet Take 40 mg by mouth at bedtime.   fenofibrate 160 MG tablet Take 160 mg by mouth at bedtime.   metoprolol tartrate (LOPRESSOR) 50 MG tablet Take 50 mg by mouth in the morning and at bedtime.   nitroGLYCERIN (NITROSTAT) 0.4 MG SL tablet Place 0.4 mg under the tongue every 5 (five) minutes x 3 doses as needed for chest pain.   omega-3 acid ethyl esters (LOVAZA) 1 g capsule TAKE 2 CAPSULES BY MOUTH TWICE DAILY   pantoprazole (PROTONIX) 40 MG tablet Take 40 mg by mouth daily.   rosuvastatin (CRESTOR) 20 MG tablet Take 1 tablet (20 mg total) by mouth at bedtime.     Allergies:   Promethazine   Social History   Socioeconomic History   Marital status: Married    Spouse name: Not on file   Number of children: Not on file   Years of education: Not on file   Highest education level: Not on file  Occupational History   Not on file  Tobacco Use   Smoking  status: Never   Smokeless tobacco: Never  Vaping Use   Vaping Use: Never used  Substance and Sexual Activity   Alcohol use: Never   Drug use: Never   Sexual activity: Not on file  Other Topics Concern   Not on file  Social History Narrative   Not on file   Social Determinants of Health   Financial Resource Strain: Not on file  Food Insecurity: Not on file  Transportation Needs: Not on file  Physical Activity: Not on file  Stress: Not on file  Social Connections: Not on file     Family History: The patient's family history includes Breast cancer in his mother; CAD in his father; Fibromyalgia in his sister. ROS:   Please see the history of present illness.    All other systems reviewed and are negative.  EKGs/Labs/Other Studies Reviewed:    The following studies were reviewed today:    Recent Labs: 10/07/2020: ALT 28 10/14/2020: Magnesium 1.9 10/15/2020: BUN 19; Creatinine, Ser 1.13; Hemoglobin 11.3; Platelets 193; Potassium 4.1; Sodium 134  Recent Lipid Panel    Component Value  Date/Time   CHOL 84 10/09/2020 0325   CHOL 155 02/26/2020 0808   TRIG 117 10/09/2020 0325   HDL 32 (L) 10/09/2020 0325   HDL 39 (L) 02/26/2020 0808   CHOLHDL 2.6 10/09/2020 0325   VLDL 23 10/09/2020 0325   LDLCALC 29 10/09/2020 0325   LDLCALC 91 02/26/2020 0808    Physical Exam:    VS:  BP 120/62 (BP Location: Right Arm, Patient Position: Sitting, Cuff Size: Normal)   Pulse (!) 54   Ht 6' (1.829 m)   Wt 242 lb (109.8 kg)   SpO2 96%   BMI 32.82 kg/m     Wt Readings from Last 3 Encounters:  11/11/20 242 lb (109.8 kg)  10/15/20 241 lb 12.8 oz (109.7 kg)  09/29/20 245 lb (111.1 kg)     GEN:  Well nourished, well developed in no acute distress HEENT: Normal NECK: No JVD; No carotid bruits LYMPHATICS: No lymphadenopathy CARDIAC: RRR, no murmurs, rubs, gallops RESPIRATORY:  Clear to auscultation without rales, wheezing or rhonchi  ABDOMEN: Soft, non-tender, non-distended MUSCULOSKELETAL:  No edema; No deformity  SKIN: Warm and dry NEUROLOGIC:  Alert and oriented x 3 PSYCHIATRIC:  Normal affect    Signed, Norman Herrlich, MD  11/11/2020 4:09 PM    [facility]

## 2020-11-17 ENCOUNTER — Ambulatory Visit: Payer: Medicare PPO | Admitting: Cardiology

## 2020-11-24 ENCOUNTER — Other Ambulatory Visit: Payer: Self-pay | Admitting: Thoracic Surgery (Cardiothoracic Vascular Surgery)

## 2020-11-24 DIAGNOSIS — Z951 Presence of aortocoronary bypass graft: Secondary | ICD-10-CM

## 2020-11-25 ENCOUNTER — Encounter: Payer: Self-pay | Admitting: Physician Assistant

## 2020-11-25 ENCOUNTER — Ambulatory Visit
Admission: RE | Admit: 2020-11-25 | Discharge: 2020-11-25 | Disposition: A | Discharge status: Home / Self Care | Payer: Medicare PPO | Source: Ambulatory Visit | Attending: Thoracic Surgery (Cardiothoracic Vascular Surgery) | Admitting: Thoracic Surgery (Cardiothoracic Vascular Surgery)

## 2020-11-25 ENCOUNTER — Other Ambulatory Visit: Payer: Self-pay

## 2020-11-25 ENCOUNTER — Ambulatory Visit (INDEPENDENT_AMBULATORY_CARE_PROVIDER_SITE_OTHER): Payer: Self-pay | Admitting: Physician Assistant

## 2020-11-25 VITALS — BP 127/66 | HR 60 | Resp 20 | Ht 72.0 in | Wt 243.0 lb

## 2020-11-25 DIAGNOSIS — I480 Paroxysmal atrial fibrillation: Secondary | ICD-10-CM

## 2020-11-25 DIAGNOSIS — Z951 Presence of aortocoronary bypass graft: Secondary | ICD-10-CM

## 2020-11-25 DIAGNOSIS — I517 Cardiomegaly: Secondary | ICD-10-CM | POA: Diagnosis not present

## 2020-11-25 NOTE — Progress Notes (Signed)
[address]         [name] [address]             [phone]        Phillip Terry is a 75 y.o. male patient who underwent a coronary bypass grafting x3 on 10/11/2020.  His postoperative course was complicated by atrial fibrillation but he converted on IV amiodarone and was transitioned to oral for discharge.  He spoke with Dr. Cliffton Asters during a virtual appointment on 10/28/2020 and overall is doing well.  He was not ambulating much at this time.  He returns the office today for his in person 1 month follow-up appointment.  Phillip Terry is doing really well about 6 weeks after surgery.  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  He has been avoiding heavy lifting and excessive yard work.   No diagnosis found. Past Medical History:  Diagnosis Date   CAD in native artery    Calculus of kidney    Essential hypertension    Hepatic steatosis    Hyperlipidemia    OSA on CPAP 03/16/2007   RDI 39 Sat 89%   Peripheral neuropathy    Pulmonary fibrosis (HCC) 10/27/2019   No past surgical history pertinent negatives on file. Scheduled Meds: Current Outpatient Medications on File Prior to Visit  Medication Sig Dispense Refill   amLODipine-valsartan (EXFORGE) 10-320 MG tablet Take 1 tablet by mouth at bedtime.     aspirin EC 325 MG EC tablet Take 1 tablet (325 mg total) by mouth daily. 30 tablet 0   febuxostat (ULORIC) 40 MG tablet Take 40 mg by mouth at bedtime.     fenofibrate 160 MG tablet Take 160 mg by mouth at bedtime.     metoprolol tartrate (LOPRESSOR) 50 MG tablet Take 50 mg by mouth in the morning and at bedtime.     nitroGLYCERIN (NITROSTAT) 0.4 MG SL tablet Place 0.4 mg under the tongue every 5 (five) minutes x 3 doses as needed for chest pain.     omega-3 acid ethyl esters (LOVAZA) 1 g capsule TAKE 2 CAPSULES BY MOUTH TWICE DAILY 360 capsule 3   pantoprazole (PROTONIX) 40 MG tablet Take 40 mg by mouth daily.      rosuvastatin (CRESTOR) 20 MG tablet Take 1 tablet (20 mg total) by mouth at bedtime.     No current facility-administered medications on file prior to visit.     Allergies  Allergen Reactions   Promethazine Other (See Comments)    Hallucinations   Active Problems:   * No active hospital problems. *  There were no vitals taken for this visit.  CLINICAL DATA:  History of CABG   EXAM: CHEST - 2 VIEW   COMPARISON:  10/13/2020   FINDINGS: Postsurgical changes from CABG. Mild cardiomegaly. No focal airspace consolidation, pleural effusion, or pneumothorax.   IMPRESSION: No active cardiopulmonary disease.     Electronically Signed   By: Duanne Guess D.O.   On: 11/25/2020 13:20  Vitals:   11/25/20 1258  BP: 127/66  Pulse: 60  Resp: 20  SpO2: 98%   Cor: Regular rate and rhythm, no murmur Pulmonary: Clear to auscultation bilaterally and in all fields Abdomen: No tenderness, positive bowel sounds Wound: Sternotomy incision is healing well as well as his EVH incision Extremities: Edema right greater than left.  This is his vein harvest side   Assessment & Plan  Phillip Terry is recovering well from coronary bypass grafting.  He does not have any significant shortness of breath or chest pain since surgery.  He is weaned himself off of the narcotic medications and for that reason it is okay to drive.  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  He is encouraged to participate in a cardiac rehab program.  He can increase his upper extremity weight limitation by about 1 to 2 pounds a week but still should not engage in any heavy lifting at this time.  We would like to wait until at least the 83-month mark for any heavy lifting.  He has follow-up arranged with his cardiologist and his primary care.  We reviewed his chest x-ray and I did not have any concerns at this time.  I think overall he is recovering really well and  does not need to follow-up with [place] for routine visit.  He had some nausea postop but this seems to have resolved with the addition of Protonix and getting off of his amiodarone medication.  Plan: Referral to cardiac rehab.  He is encouraged to call our office if he has any questions or other concerns regarding his surgery or surgical incisions.  He does not need any routine follow-up with [place] and can follow-up with his cardiologist and primary care at this time.  Sharlene Dory 11/25/2020

## 2020-11-28 ENCOUNTER — Other Ambulatory Visit: Payer: Self-pay | Admitting: *Deleted

## 2020-11-28 NOTE — Progress Notes (Signed)
Cardiac Rehab referral faxed to [facility] at [phone] Per T. [place], [place].

## 2020-12-06 DIAGNOSIS — G629 Polyneuropathy, unspecified: Secondary | ICD-10-CM | POA: Diagnosis not present

## 2020-12-06 DIAGNOSIS — I10 Essential (primary) hypertension: Secondary | ICD-10-CM | POA: Diagnosis not present

## 2020-12-06 DIAGNOSIS — Z79899 Other long term (current) drug therapy: Secondary | ICD-10-CM | POA: Diagnosis not present

## 2020-12-06 DIAGNOSIS — I200 Unstable angina: Secondary | ICD-10-CM | POA: Diagnosis not present

## 2020-12-06 DIAGNOSIS — I480 Paroxysmal atrial fibrillation: Secondary | ICD-10-CM | POA: Diagnosis not present

## 2020-12-06 DIAGNOSIS — Z955 Presence of coronary angioplasty implant and graft: Secondary | ICD-10-CM | POA: Diagnosis not present

## 2020-12-06 DIAGNOSIS — G4733 Obstructive sleep apnea (adult) (pediatric): Secondary | ICD-10-CM | POA: Diagnosis not present

## 2020-12-06 DIAGNOSIS — E785 Hyperlipidemia, unspecified: Secondary | ICD-10-CM | POA: Diagnosis not present

## 2020-12-06 DIAGNOSIS — Z7982 Long term (current) use of aspirin: Secondary | ICD-10-CM | POA: Diagnosis not present

## 2020-12-07 DIAGNOSIS — I10 Essential (primary) hypertension: Secondary | ICD-10-CM | POA: Diagnosis not present

## 2020-12-07 DIAGNOSIS — Z955 Presence of coronary angioplasty implant and graft: Secondary | ICD-10-CM | POA: Diagnosis not present

## 2020-12-07 DIAGNOSIS — Z79899 Other long term (current) drug therapy: Secondary | ICD-10-CM | POA: Diagnosis not present

## 2020-12-07 DIAGNOSIS — G4733 Obstructive sleep apnea (adult) (pediatric): Secondary | ICD-10-CM | POA: Diagnosis not present

## 2020-12-07 DIAGNOSIS — Z7982 Long term (current) use of aspirin: Secondary | ICD-10-CM | POA: Diagnosis not present

## 2020-12-07 DIAGNOSIS — G629 Polyneuropathy, unspecified: Secondary | ICD-10-CM | POA: Diagnosis not present

## 2020-12-07 DIAGNOSIS — I200 Unstable angina: Secondary | ICD-10-CM | POA: Diagnosis not present

## 2020-12-07 DIAGNOSIS — E785 Hyperlipidemia, unspecified: Secondary | ICD-10-CM | POA: Diagnosis not present

## 2020-12-09 DIAGNOSIS — E785 Hyperlipidemia, unspecified: Secondary | ICD-10-CM | POA: Diagnosis not present

## 2020-12-09 DIAGNOSIS — I10 Essential (primary) hypertension: Secondary | ICD-10-CM | POA: Diagnosis not present

## 2020-12-09 DIAGNOSIS — Z7982 Long term (current) use of aspirin: Secondary | ICD-10-CM | POA: Diagnosis not present

## 2020-12-09 DIAGNOSIS — I200 Unstable angina: Secondary | ICD-10-CM | POA: Diagnosis not present

## 2020-12-09 DIAGNOSIS — G629 Polyneuropathy, unspecified: Secondary | ICD-10-CM | POA: Diagnosis not present

## 2020-12-09 DIAGNOSIS — Z955 Presence of coronary angioplasty implant and graft: Secondary | ICD-10-CM | POA: Diagnosis not present

## 2020-12-09 DIAGNOSIS — Z79899 Other long term (current) drug therapy: Secondary | ICD-10-CM | POA: Diagnosis not present

## 2020-12-09 DIAGNOSIS — G4733 Obstructive sleep apnea (adult) (pediatric): Secondary | ICD-10-CM | POA: Diagnosis not present

## 2020-12-12 DIAGNOSIS — I10 Essential (primary) hypertension: Secondary | ICD-10-CM | POA: Diagnosis not present

## 2020-12-12 DIAGNOSIS — G4733 Obstructive sleep apnea (adult) (pediatric): Secondary | ICD-10-CM | POA: Diagnosis not present

## 2020-12-12 DIAGNOSIS — Z7982 Long term (current) use of aspirin: Secondary | ICD-10-CM | POA: Diagnosis not present

## 2020-12-12 DIAGNOSIS — Z955 Presence of coronary angioplasty implant and graft: Secondary | ICD-10-CM | POA: Diagnosis not present

## 2020-12-12 DIAGNOSIS — G629 Polyneuropathy, unspecified: Secondary | ICD-10-CM | POA: Diagnosis not present

## 2020-12-12 DIAGNOSIS — E785 Hyperlipidemia, unspecified: Secondary | ICD-10-CM | POA: Diagnosis not present

## 2020-12-12 DIAGNOSIS — I200 Unstable angina: Secondary | ICD-10-CM | POA: Diagnosis not present

## 2020-12-12 DIAGNOSIS — Z79899 Other long term (current) drug therapy: Secondary | ICD-10-CM | POA: Diagnosis not present

## 2020-12-14 DIAGNOSIS — Z955 Presence of coronary angioplasty implant and graft: Secondary | ICD-10-CM | POA: Diagnosis not present

## 2020-12-16 DIAGNOSIS — Z955 Presence of coronary angioplasty implant and graft: Secondary | ICD-10-CM | POA: Diagnosis not present

## 2020-12-19 DIAGNOSIS — Z955 Presence of coronary angioplasty implant and graft: Secondary | ICD-10-CM | POA: Diagnosis not present

## 2020-12-21 DIAGNOSIS — Z955 Presence of coronary angioplasty implant and graft: Secondary | ICD-10-CM | POA: Diagnosis not present

## 2020-12-23 DIAGNOSIS — Z955 Presence of coronary angioplasty implant and graft: Secondary | ICD-10-CM | POA: Diagnosis not present

## 2020-12-26 DIAGNOSIS — Z955 Presence of coronary angioplasty implant and graft: Secondary | ICD-10-CM | POA: Diagnosis not present

## 2020-12-28 DIAGNOSIS — Z955 Presence of coronary angioplasty implant and graft: Secondary | ICD-10-CM | POA: Diagnosis not present

## 2020-12-30 DIAGNOSIS — Z955 Presence of coronary angioplasty implant and graft: Secondary | ICD-10-CM | POA: Diagnosis not present

## 2021-01-02 DIAGNOSIS — Z955 Presence of coronary angioplasty implant and graft: Secondary | ICD-10-CM | POA: Diagnosis not present

## 2021-01-03 DIAGNOSIS — Z955 Presence of coronary angioplasty implant and graft: Secondary | ICD-10-CM | POA: Diagnosis not present

## 2021-01-04 DIAGNOSIS — Z955 Presence of coronary angioplasty implant and graft: Secondary | ICD-10-CM | POA: Diagnosis not present

## 2021-01-09 DIAGNOSIS — Z955 Presence of coronary angioplasty implant and graft: Secondary | ICD-10-CM | POA: Diagnosis not present

## 2021-01-11 DIAGNOSIS — Z955 Presence of coronary angioplasty implant and graft: Secondary | ICD-10-CM | POA: Diagnosis not present

## 2021-01-13 DIAGNOSIS — Z955 Presence of coronary angioplasty implant and graft: Secondary | ICD-10-CM | POA: Diagnosis not present

## 2021-01-18 DIAGNOSIS — Z955 Presence of coronary angioplasty implant and graft: Secondary | ICD-10-CM | POA: Diagnosis not present

## 2021-01-20 DIAGNOSIS — Z955 Presence of coronary angioplasty implant and graft: Secondary | ICD-10-CM | POA: Diagnosis not present

## 2021-01-23 DIAGNOSIS — Z955 Presence of coronary angioplasty implant and graft: Secondary | ICD-10-CM | POA: Diagnosis not present

## 2021-01-25 DIAGNOSIS — Z955 Presence of coronary angioplasty implant and graft: Secondary | ICD-10-CM | POA: Diagnosis not present

## 2021-01-27 DIAGNOSIS — Z955 Presence of coronary angioplasty implant and graft: Secondary | ICD-10-CM | POA: Diagnosis not present

## 2021-01-30 DIAGNOSIS — Z955 Presence of coronary angioplasty implant and graft: Secondary | ICD-10-CM | POA: Diagnosis not present

## 2021-02-14 DIAGNOSIS — Z955 Presence of coronary angioplasty implant and graft: Secondary | ICD-10-CM | POA: Diagnosis not present

## 2021-02-15 DIAGNOSIS — Z955 Presence of coronary angioplasty implant and graft: Secondary | ICD-10-CM | POA: Diagnosis not present

## 2021-02-16 DIAGNOSIS — C44319 Basal cell carcinoma of skin of other parts of face: Secondary | ICD-10-CM | POA: Diagnosis not present

## 2021-02-16 DIAGNOSIS — L821 Other seborrheic keratosis: Secondary | ICD-10-CM | POA: Diagnosis not present

## 2021-02-16 DIAGNOSIS — L578 Other skin changes due to chronic exposure to nonionizing radiation: Secondary | ICD-10-CM | POA: Diagnosis not present

## 2021-02-17 DIAGNOSIS — Z955 Presence of coronary angioplasty implant and graft: Secondary | ICD-10-CM | POA: Diagnosis not present

## 2021-02-19 NOTE — Progress Notes (Signed)
Cardiology Office Note:    Date:  02/20/2021   ID:  Phillip Terry, DOB 1945-10-12, MRN [id number]  PCP:  Paulina FusiSchultz, Douglas E, MD  Cardiologist:  Norman HerrlichBrian Joseguadalupe Stan, MD    Referring MD: Paulina FusiSchultz, Douglas E, MD    ASSESSMENT:    1. Coronary artery disease of native artery of native heart with stable angina pectoris (HCC)   2. S/P CABG x 3   3. PAF (paroxysmal atrial fibrillation) (HCC)   4. Essential hypertension   5. Mixed hyperlipidemia   6. Pulmonary fibrosis (HCC)    PLAN:    In order of problems listed above:  Phillip Terry continues to do well following CABG New York Heart Association class I and previous shortness of breath attributed to pulmonary fibrosis as resolved.  I strongly encouraged him to continue an exercise program maintenance cardiac rehab continue his medical therapy including aspirin antihypertensives and high intensity statin. No recurrence of atrial fibrillation not on amiodarone BP at target cardiac rehab in range less than 03/14/1938 systolic and continue calcium channel blocker ARB and beta-blocker Continue statin check lipid profile CMP Stable much of his previous shortness of breath attributed to pulmonary fibrosis is improved with bypass surgery and in retrospect was anginal equivalent.   Next appointment: 6 months   Medication Adjustments/Labs and Tests Ordered: Current medicines are reviewed at length with the patient today.  Concerns regarding medicines are outlined above.  No orders of the defined types were placed in this encounter.  No orders of the defined types were placed in this encounter.   Chief Complaint  Patient presents with   Follow-up   Coronary Artery Disease    Following CABG 10/11/2020    History of Present Illness:    Phillip Terry is a 76 y.o. male with a hx of pulmonary fibrosis hypertension hyperlipidemia and CAD with elective CABG 10/11/2020 for multivessel CAD last seen 11/11/2020 . Compliance with diet, lifestyle and medications:  yes  Phillip Terry continues to improve in cardiac rehabilitation no longer short of breath no edema chest pain palpitation or syncope.  He underwent left heart catheterization 09/23/2020 multivessel CAD vessel CAD and referred for CABG. He had elective CABG 10/11/2020 with a left thoracic artery anastomosed to the left anterior descending coronary artery saphenous vein graft to the PDA and saphenous vein graft #2 to the diagonal branch.  In postoperative course was noted for atrial fibrillation placed on amiodarone and converted to sinus rhythm and placed on oral amiodarone.  He had volume overloaded state and required IV diuretic he remained in sinus rhythm and was discharged postoperative day 4.  Coronary Findings   Diagnostic Dominance: Right Left Anterior Descending  The vessel exhibits minimal luminal irregularities.  Prox LAD lesion is 90% stenosed. The lesion is severely calcified.  Mid LAD lesion is 75% stenosed. The lesion is severely calcified.  Second Diagonal Branch  Vessel is small in size.  2nd Diag lesion is 80% stenosed.  Left Circumflex  Dist Cx lesion is 50% stenosed.  Right Coronary Artery  Prox RCA to Mid RCA lesion is 60% stenosed.  Right Posterior Atrioventricular Artery  RPAV lesion is 50% stenosed.  Intervention   No interventions have been documented. Wall Motion                  Left Heart   Left Ventricle The left ventricular size is normal. The left ventricular systolic function is normal. LV end diastolic pressure is normal. The left ventricular ejection fraction is 55-65% by  visual estimate. No regional wall motion abnormalities.  Aortic Valve There is no aortic valve stenosis.    Coronary Diagrams     Diagnostic Dominance: Right      Past Medical History:  Diagnosis Date   CAD in native artery    Calculus of kidney    Essential hypertension    Hepatic steatosis    Hyperlipidemia    OSA on CPAP 03/16/2007   RDI 39 Sat 89%   Peripheral  neuropathy    Pulmonary fibrosis (HCC) 10/27/2019    Past Surgical History:  Procedure Laterality Date   CATARACT EXTRACTION Bilateral    CORONARY ARTERY BYPASS GRAFT N/A 10/11/2020   Procedure: CORONARY ARTERY BYPASS GRAFTING (CABG), ON PUMP, TIMES THREE, USING LEFT INTERNAL MAMMARY ARTERY AND RIGHT ENDOSCOPICALLY HARVESTED GREATER SAPHENOUS VEIN;  Surgeon: Corliss Skains, MD;  Location: [facility] OR;  Service: Open Heart Surgery;  Laterality: N/A;   FOOT SURGERY Left 2012   gastronemius slide procedure   LEFT HEART CATH AND CORONARY ANGIOGRAPHY N/A 09/23/2020   Procedure: LEFT HEART CATH AND CORONARY ANGIOGRAPHY;  Surgeon: Corky Crafts, MD;  Location: [facility] INVASIVE CV LAB;  Service: Cardiovascular;  Laterality: N/A;   LEG SURGERY Left 2012   Gastronemius slide procedure   TEE WITHOUT CARDIOVERSION N/A 10/11/2020   Procedure: TRANSESOPHAGEAL ECHOCARDIOGRAM (TEE);  Surgeon: Corliss Skains, MD;  Location: [facility] OR;  Service: Open Heart Surgery;  Laterality: N/A;   TONSILLECTOMY      Current Medications: Current Meds  Medication Sig   amLODipine-valsartan (EXFORGE) 10-320 MG tablet Take 1 tablet by mouth at bedtime.   aspirin EC 325 MG EC tablet Take 1 tablet (325 mg total) by mouth daily.   febuxostat (ULORIC) 40 MG tablet Take 40 mg by mouth at bedtime.   fenofibrate 160 MG tablet Take 160 mg by mouth at bedtime.   metoprolol tartrate (LOPRESSOR) 50 MG tablet Take 50 mg by mouth in the morning and at bedtime.   nitroGLYCERIN (NITROSTAT) 0.4 MG SL tablet Place 0.4 mg under the tongue every 5 (five) minutes x 3 doses as needed for chest pain.   omega-3 acid ethyl esters (LOVAZA) 1 g capsule TAKE 2 CAPSULES BY MOUTH TWICE DAILY   pantoprazole (PROTONIX) 40 MG tablet Take 40 mg by mouth daily.     Allergies:   Promethazine   Social History   Socioeconomic History   Marital status: Married    Spouse name: Not on file   Number of children: Not on file   Years of education: Not  on file   Highest education level: Not on file  Occupational History   Not on file  Tobacco Use   Smoking status: Never    Passive exposure: Never   Smokeless tobacco: Never  Vaping Use   Vaping Use: Never used  Substance and Sexual Activity   Alcohol use: Never   Drug use: Never   Sexual activity: Not on file  Other Topics Concern   Not on file  Social History Narrative   Not on file   Social Determinants of Health   Financial Resource Strain: Not on file  Food Insecurity: Not on file  Transportation Needs: Not on file  Physical Activity: Not on file  Stress: Not on file  Social Connections: Not on file     Family History: The patient's family history includes Breast cancer in his mother; CAD in his father; Fibromyalgia in his sister. ROS:   Please see the history of present  illness.    All other systems reviewed and are negative.  EKGs/Labs/Other Studies Reviewed:    The following studies were reviewed today:    Recent Labs: 10/07/2020: ALT 28 10/14/2020: Magnesium 1.9 10/15/2020: BUN 19; Creatinine, Ser 1.13; Hemoglobin 11.3; Platelets 193; Potassium 4.1; Sodium 134  Recent Lipid Panel    Component Value Date/Time   CHOL 84 10/09/2020 0325   CHOL 155 02/26/2020 0808   TRIG 117 10/09/2020 0325   HDL 32 (L) 10/09/2020 0325   HDL 39 (L) 02/26/2020 0808   CHOLHDL 2.6 10/09/2020 0325   VLDL 23 10/09/2020 0325   LDLCALC 29 10/09/2020 0325   LDLCALC 91 02/26/2020 0808    Physical Exam:    VS:  BP (!) 146/72 (BP Location: Right Arm)    Pulse (!) 54    Ht 6' (1.829 m)    Wt 252 lb 12.8 oz (114.7 kg)    SpO2 98%    BMI 34.29 kg/m     Wt Readings from Last 3 Encounters:  02/20/21 252 lb 12.8 oz (114.7 kg)  11/25/20 243 lb (110.2 kg)  11/11/20 242 lb (109.8 kg)     GEN:  Well nourished, well developed in no acute distress HEENT: Normal NECK: No JVD; No carotid bruits LYMPHATICS: No lymphadenopathy CARDIAC: RRR, no murmurs, rubs, gallops RESPIRATORY:  Clear  to auscultation without rales, wheezing or rhonchi  ABDOMEN: Soft, non-tender, non-distended MUSCULOSKELETAL:  No edema; No deformity  SKIN: Warm and dry NEUROLOGIC:  Alert and oriented x 3 PSYCHIATRIC:  Normal affect    Signed, Norman Herrlich, MD  02/20/2021 9:19 AM    [facility]

## 2021-02-20 ENCOUNTER — Encounter: Payer: Self-pay | Admitting: Cardiology

## 2021-02-20 ENCOUNTER — Other Ambulatory Visit: Payer: Self-pay

## 2021-02-20 ENCOUNTER — Ambulatory Visit: Payer: Medicare PPO | Admitting: Cardiology

## 2021-02-20 VITALS — BP 146/72 | HR 54 | Ht 72.0 in | Wt 252.8 lb

## 2021-02-20 DIAGNOSIS — I10 Essential (primary) hypertension: Secondary | ICD-10-CM

## 2021-02-20 DIAGNOSIS — J8410 Pulmonary fibrosis, unspecified: Secondary | ICD-10-CM | POA: Diagnosis not present

## 2021-02-20 DIAGNOSIS — I25118 Atherosclerotic heart disease of native coronary artery with other forms of angina pectoris: Secondary | ICD-10-CM

## 2021-02-20 DIAGNOSIS — I480 Paroxysmal atrial fibrillation: Secondary | ICD-10-CM

## 2021-02-20 DIAGNOSIS — E782 Mixed hyperlipidemia: Secondary | ICD-10-CM | POA: Diagnosis not present

## 2021-02-20 DIAGNOSIS — Z951 Presence of aortocoronary bypass graft: Secondary | ICD-10-CM

## 2021-02-20 MED ORDER — ASPIRIN EC 81 MG PO TBEC: 81.0000 mg | DELAYED_RELEASE_TABLET | Freq: Every day | ORAL | 3 refills | Status: DC

## 2021-02-20 NOTE — Addendum Note (Signed)
Addended by: Delorse Limber I on: 02/20/2021 09:27 AM   Modules accepted: Orders

## 2021-02-20 NOTE — Patient Instructions (Signed)
Medication Instructions:  Your physician has recommended you make the following change in your medication:  DECREASE: Aspirin 81 mg take one tablet by mouth daily.  *If you need a refill on your cardiac medications before your next appointment, please call your pharmacy*   Lab Work: Your physician recommends that you return for lab work in: TODAY CMP, Lipids If you have labs (blood work) drawn today and your tests are completely normal, you will receive your results only by: . MyChart Message (if you have MyChart) OR . A paper copy in the mail If you have any lab test that is abnormal or we need to change your treatment, we will call you to review the results.   Testing/Procedures: None   Follow-Up: At [facility], you and your health needs are our priority.  As part of our continuing mission to provide you with exceptional heart care, we have created designated Provider Care Teams.  These Care Teams include your primary Cardiologist (physician) and Advanced Practice Providers (APPs -  Physician Assistants and Nurse Practitioners) who all work together to provide you with the care you need, when you need it.  We recommend signing up for the patient portal called "MyChart".  Sign up information is provided on this After Visit Summary.  MyChart is used to connect with patients for Virtual Visits (Telemedicine).  Patients are able to view lab/test results, encounter notes, upcoming appointments, etc.  Non-urgent messages can be sent to your provider as well.   To learn more about what you can do with MyChart, go to [email or web address].    Your next appointment:   6 month(s)  The format for your next appointment:   In Person  Provider:   Brian Munley, MD   Other Instructions   

## 2021-02-21 LAB — LIPID PANEL
Chol/HDL Ratio: 2.5 ratio (ref 0.0–5.0)
Cholesterol, Total: 113 mg/dL (ref 100–199)
HDL: 45 mg/dL (ref >39)
LDL Chol Calc (NIH): 45 mg/dL (ref 0–99)
Triglycerides: 133 mg/dL (ref 0–149)
VLDL Cholesterol Cal: 23 mg/dL (ref 5–40)

## 2021-02-21 LAB — COMPREHENSIVE METABOLIC PANEL
ALT: 29 IU/L (ref 0–44)
AST: 26 IU/L (ref 0–40)
Albumin/Globulin Ratio: 1.8 (ref 1.2–2.2)
Albumin: 4.6 g/dL (ref 3.7–4.7)
Alkaline Phosphatase: 104 IU/L (ref 44–121)
BUN/Creatinine Ratio: 15 (ref 10–24)
BUN: 17 mg/dL (ref 8–27)
Bilirubin Total: 0.5 mg/dL (ref 0.0–1.2)
CO2: 21 mmol/L (ref 20–29)
Calcium: 9.7 mg/dL (ref 8.6–10.2)
Chloride: 101 mmol/L (ref 96–106)
Creatinine, Ser: 1.15 mg/dL (ref 0.76–1.27)
Globulin, Total: 2.6 g/dL (ref 1.5–4.5)
Glucose: 83 mg/dL (ref 70–99)
Potassium: 4.3 mmol/L (ref 3.5–5.2)
Sodium: 136 mmol/L (ref 134–144)
Total Protein: 7.2 g/dL (ref 6.0–8.5)
eGFR: 67 mL/min/{1.73_m2} (ref >59)

## 2021-02-22 DIAGNOSIS — Z955 Presence of coronary angioplasty implant and graft: Secondary | ICD-10-CM | POA: Diagnosis not present

## 2021-02-24 DIAGNOSIS — Z955 Presence of coronary angioplasty implant and graft: Secondary | ICD-10-CM | POA: Diagnosis not present

## 2021-02-27 DIAGNOSIS — Z955 Presence of coronary angioplasty implant and graft: Secondary | ICD-10-CM | POA: Diagnosis not present

## 2021-03-01 DIAGNOSIS — Z955 Presence of coronary angioplasty implant and graft: Secondary | ICD-10-CM | POA: Diagnosis not present

## 2021-03-03 DIAGNOSIS — Z955 Presence of coronary angioplasty implant and graft: Secondary | ICD-10-CM | POA: Diagnosis not present

## 2021-03-06 DIAGNOSIS — Z955 Presence of coronary angioplasty implant and graft: Secondary | ICD-10-CM | POA: Diagnosis not present

## 2021-03-08 DIAGNOSIS — Z955 Presence of coronary angioplasty implant and graft: Secondary | ICD-10-CM | POA: Diagnosis not present

## 2021-03-10 DIAGNOSIS — Z955 Presence of coronary angioplasty implant and graft: Secondary | ICD-10-CM | POA: Diagnosis not present

## 2021-03-13 DIAGNOSIS — Z955 Presence of coronary angioplasty implant and graft: Secondary | ICD-10-CM | POA: Diagnosis not present

## 2021-04-20 ENCOUNTER — Telehealth: Payer: Self-pay | Admitting: Cardiology

## 2021-04-20 NOTE — Telephone Encounter (Signed)
Pt c/o medication issue: ? ?1. Name of Medication: ? omega-3 acid ethyl esters (LOVAZA) 1 g capsule ? ?2. How are you currently taking this medication (dosage and times per day)? As prescribed  ? ?3. Are you having a reaction (difficulty breathing--STAT)? Yes  ? ?4. What is your medication issue?  Patient is having digestive issues and can't sleep. Wife states he cannot get more than two hours of sleep at a time. They believe it is due to too much Omega 3. Patient and wife are going to an appt today, so they are requesting callback in regards to this tomorrow. An appt has been scheduled for 05/05/21 in regards to this. ?

## 2021-04-21 NOTE — Telephone Encounter (Signed)
Spoke with pt who complains of increasing digestive issues since starting Lovaza.  Pt taking Protonix. Pt reports he also has difficulty sleeping due to having to go to bathroom several times during the night to urinate.  He feels this has also worsened since taking Lovaza.  He would like to discuss possibly coming off some of his medications at his 05/05/2021 appointment with Dr Dulce Sellar.  Pt advised he should also speak with his PCP re: having to urinate every 2 hours during the night.  Will forward information to Dr Dulce Sellar for review. Pt verbalizes understanding and agrees with current plan. ?

## 2021-04-21 NOTE — Telephone Encounter (Signed)
Phillip Daub, MD  You 1 hour ago (9:30 AM)  ? ?Stop Lovaza   ?Attempted phone call to pt.  OK per Epic to leave detailed voicemail message.  Pt advised per Dr Dulce Sellar stop Lovaza.  Please call [phone] for further questions. ?

## 2021-05-04 DIAGNOSIS — I25119 Atherosclerotic heart disease of native coronary artery with unspecified angina pectoris: Secondary | ICD-10-CM

## 2021-05-04 DIAGNOSIS — Z0289 Encounter for other administrative examinations: Secondary | ICD-10-CM

## 2021-05-04 DIAGNOSIS — H9190 Unspecified hearing loss, unspecified ear: Secondary | ICD-10-CM | POA: Insufficient documentation

## 2021-05-04 DIAGNOSIS — H903 Sensorineural hearing loss, bilateral: Secondary | ICD-10-CM

## 2021-05-04 DIAGNOSIS — E669 Obesity, unspecified: Secondary | ICD-10-CM | POA: Insufficient documentation

## 2021-05-04 DIAGNOSIS — I2510 Atherosclerotic heart disease of native coronary artery without angina pectoris: Secondary | ICD-10-CM | POA: Insufficient documentation

## 2021-05-04 DIAGNOSIS — M109 Gout, unspecified: Secondary | ICD-10-CM

## 2021-05-04 DIAGNOSIS — Z77090 Contact with and (suspected) exposure to asbestos: Secondary | ICD-10-CM

## 2021-05-04 DIAGNOSIS — Z461 Encounter for fitting and adjustment of hearing aid: Secondary | ICD-10-CM

## 2021-05-04 HISTORY — DX: Atherosclerotic heart disease of native coronary artery with unspecified angina pectoris: I25.119

## 2021-05-04 HISTORY — DX: Unspecified hearing loss, unspecified ear: H91.90

## 2021-05-04 HISTORY — DX: Gout, unspecified: M10.9

## 2021-05-04 HISTORY — DX: Contact with and (suspected) exposure to asbestos: Z77.090

## 2021-05-04 HISTORY — DX: Encounter for other administrative examinations: Z02.89

## 2021-05-04 HISTORY — DX: Obesity, unspecified: E66.9

## 2021-05-04 HISTORY — DX: Sensorineural hearing loss, bilateral: H90.3

## 2021-05-04 HISTORY — DX: Encounter for fitting and adjustment of hearing aid: Z46.1

## 2021-05-05 ENCOUNTER — Other Ambulatory Visit: Payer: Self-pay

## 2021-05-05 ENCOUNTER — Encounter: Payer: Self-pay | Admitting: Cardiology

## 2021-05-05 ENCOUNTER — Ambulatory Visit: Payer: Medicare PPO | Admitting: Cardiology

## 2021-05-05 VITALS — BP 154/86 | HR 64 | Ht 72.0 in | Wt 249.4 lb

## 2021-05-05 DIAGNOSIS — Z79899 Other long term (current) drug therapy: Secondary | ICD-10-CM

## 2021-05-05 DIAGNOSIS — I480 Paroxysmal atrial fibrillation: Secondary | ICD-10-CM

## 2021-05-05 DIAGNOSIS — I25118 Atherosclerotic heart disease of native coronary artery with other forms of angina pectoris: Secondary | ICD-10-CM | POA: Diagnosis not present

## 2021-05-05 DIAGNOSIS — I10 Essential (primary) hypertension: Secondary | ICD-10-CM | POA: Diagnosis not present

## 2021-05-05 DIAGNOSIS — E782 Mixed hyperlipidemia: Secondary | ICD-10-CM | POA: Diagnosis not present

## 2021-05-05 MED ORDER — PRAVASTATIN SODIUM 20 MG PO TABS
20.0000 mg | ORAL_TABLET | Freq: Every evening | ORAL | 3 refills | Status: DC
Start: 2021-05-05 — End: 2022-05-02

## 2021-05-05 NOTE — Patient Instructions (Signed)
Medication Instructions:  ?Your physician has recommended you make the following change in your medication:  ?Start Pravastatin 20 mg once daily ? ?*If you need a refill on your cardiac medications before your next appointment, please call your pharmacy* ? ? ?Lab Work: ?Your physician recommends that you return for lab work in: 1 Month for a CMP and Lipid panel ?Please be fasting. You do not need appointment. Lab opens at 8am ? ?If you have labs (blood work) drawn today and your tests are completely normal, you will receive your results only by: ?MyChart Message (if you have MyChart) OR ?A paper copy in the mail ?If you have any lab test that is abnormal or we need to change your treatment, we will call you to review the results. ? ? ?Testing/Procedures: ?NONE ? ? ?Follow-Up: ?At [facility], you and your health needs are our priority.  As part of our continuing mission to provide you with exceptional heart care, we have created designated Provider Care Teams.  These Care Teams include your primary Cardiologist (physician) and Advanced Practice Providers (APPs -  Physician Assistants and Nurse Practitioners) who all work together to provide you with the care you need, when you need it. ? ?We recommend signing up for the patient portal called "MyChart".  Sign up information is provided on this After Visit Summary.  MyChart is used to connect with patients for Virtual Visits (Telemedicine).  Patients are able to view lab/test results, encounter notes, upcoming appointments, etc.  Non-urgent messages can be sent to your provider as well.   ?To learn more about what you can do with MyChart, go to [email or web address].   ? ?Your next appointment:   ?9 month(s) ? ?The format for your next appointment:   ?In Person ? ?Provider:   ?Norman Herrlich, MD  ? ? ?Other Instructions ?  ?

## 2021-05-05 NOTE — Progress Notes (Signed)
?Cardiology Office Note:   ? ?Date:  05/05/2021  ? ?IDClaiborne Stroble, DOB 13-Feb-1946, MRN [id number] ? ?PCP:  Paulina Fusi, MD  ?Cardiologist:  Norman Herrlich, MD   ? ?Referring MD: Paulina Fusi, MD  ? ? ?ASSESSMENT:   ? ?1. Coronary artery disease of native artery of native heart with stable angina pectoris (HCC)   ?2. PAF (paroxysmal atrial fibrillation) (HCC)   ?3. Essential hypertension   ?4. Mixed hyperlipidemia   ?5. On amiodarone therapy   ? ?PLAN:   ? ?In order of problems listed above: ? ?He is doing well following bypass surgery on appropriate medical therapy including aspirin he needs to resume his statin we will choose a low-dose of low intensity pravastatin rechallenge and if not able to tolerate will use bempedoic acid.  Recheck lipid profile and CMP 6 weeks ?Stable no recurrence of atrial fibrillation continue beta-blocker and not anticoagulated ?Resume statin ?No longer on amiodarone ? ? ?Next appointment: 9 months ? ? ?Medication Adjustments/Labs and Tests Ordered: ?Current medicines are reviewed at length with the patient today.  Concerns regarding medicines are outlined above.  ?Orders Placed This Encounter  ?Procedures  ? Comprehensive metabolic panel  ? Lipid panel  ? ?Meds ordered this encounter  ?Medications  ? pravastatin (PRAVACHOL) 20 MG tablet  ?  Sig: Take 1 tablet (20 mg total) by mouth every evening.  ?  Dispense:  90 tablet  ?  Refill:  3  ? ? ?Follow-up for CAD, my statin was discontinued because as having urinary frequency which cleared ? ?History of Present Illness:   ? ?Phillip Terry is a 76 y.o. male with a hx of pulmonary fibrosis hypertension hyperlipidemia and CAD with elective CABG 10/11/2020 for multivessel CAD  last seen 02/20/2021. ?Compliance with diet, lifestyle and medications: Yes ? 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 ?He underwent left heart catheterization 09/23/2020 multivessel CAD vessel CAD and referred for CABG. ?He had elective CABG 10/11/2020 with a left thoracic artery anastomosed to the left anterior descending coronary artery saphenous vein graft to the PDA and saphenous vein graft #2 to the diagonal branch.  In postoperative course was noted for atrial fibrillation placed on amiodarone and converted to sinus rhythm and placed on oral amiodarone.  He had volume overloaded state and required IV diuretic he remained in sinus rhythm and was discharged postoperative day 4. ?  ?Coronary Findings ?  ?Diagnostic ?Dominance: Right ?Left Anterior Descending  ?The vessel exhibits minimal luminal irregularities.  ?Prox LAD lesion is 90% stenosed. The lesion is severely calcified.  ?Mid LAD lesion is 75% stenosed. The lesion is severely calcified.  ?Second Diagonal Branch  ?Vessel is small in size.  ?2nd Diag lesion is 80% stenosed.  ?Left Circumflex  ?Dist Cx lesion is 50% stenosed.  ?Right Coronary Artery  ?Prox RCA to Mid RCA lesion is 60% stenosed.  ?Right Posterior Atrioventricular Artery  ?RPAV lesion is 50% stenosed.  ?Intervention ?  ?No interventions have been documented. ?Wall Motion ?  ?    ?   ?  ?  ?  ?  ?  Left Heart ?  ?Left Ventricle The left ventricular size is normal. The left ventricular systolic function is normal. LV end diastolic pressure is normal. The left ventricular ejection fraction is 55-65% by visual estimate. No regional wall motion abnormalities.  ?Aortic Valve There is no aortic valve stenosis.  ?  ?Coronary Diagrams ?  ?  ?Diagnostic ?Dominance: Right ?  ? ?  ?Past Medical History:  ?Diagnosis Date  ? CAD (coronary artery disease)   ? CAD in native artery   ? Calculus of kidney   ? Chest  pain 08/22/2020  ? DOE (dyspnea on exertion) 10/27/2019  ? Encounter for fitting and adjustment of hearing aid 05/04/2021  ? Encounter for other administrative examinations 05/04/2021  ? Essential hypertension   ? Gout 05/04/2021  ? Hearing loss 05/04/2021  ? Hepatic steatosis   ? History of asbestos exposure 05/04/2021  ? Hyperglycemia 10/07/2020  ? Hyperlipidemia   ? Obesity 05/04/2021  ? OSA on CPAP 03/16/2007  ? RDI 39 Sat 89%  ? Peripheral neuropathy   ? Pulmonary fibrosis (HCC) 10/27/2019  ? S/P CABG x 3 10/14/2020  ? Sensorineural hearing loss, bilateral 05/04/2021  ? Unstable angina (HCC) 10/07/2020  ? ? ?Past Surgical History:  ?Procedure Laterality Date  ? CATARACT EXTRACTION Bilateral   ? CORONARY ARTERY BYPASS GRAFT N/A 10/11/2020  ? Procedure: CORONARY ARTERY BYPASS GRAFTING (CABG), ON PUMP, TIMES THREE, USING LEFT INTERNAL MAMMARY ARTERY AND RIGHT ENDOSCOPICALLY HARVESTED GREATER SAPHENOUS VEIN;  Surgeon: Corliss Skains, MD;  Location: [facility] OR;  Service: Open Heart Surgery;  Laterality: N/A;  ? FOOT SURGERY Left 2012  ? gastronemius slide procedure  ? LEFT HEART CATH AND CORONARY ANGIOGRAPHY N/A 09/23/2020  ? Procedure: LEFT HEART CATH AND CORONARY ANGIOGRAPHY;  Surgeon: Corky Crafts, MD;  Location[facility] INVASIVE CV LAB;  Service: Cardiovascular;  Laterality: N/A;  ? LEG SURGERY Left 2012  ? Gastronemius slide procedure  ? TEE WITHOUT CARDIOVERSION N/A 10/11/2020  ? Procedure: TRANSESOPHAGEAL ECHOCARDIOGRAM (TEE);  Surgeon: Corliss Skains, MD;  Location: [facility] OR;  Service: Open Heart Surgery;  Laterality: N/A;  ? TONSILLECTOMY    ? ? ?Current Medications: ?Current Meds  ?Medication Sig  ? amLODipine-valsartan (EXFORGE) 10-320 MG tablet Take 1 tablet by mouth at bedtime.  ? aspirin EC 81 MG tablet Take 1 tablet (81 mg total) by mouth daily. Swallow whole.  ? febuxostat (ULORIC) 40 MG tablet Take 40 mg by mouth at bedtime.  ? fenofibrate 160 MG tablet Take 160 mg by mouth at bedtime.  ? metoprolol tartrate  (LOPRESSOR) 50 MG tablet Take 50 mg by mouth in the morning and at bedtime.  ? niacinamide 500 MG tablet Take 500 mg by mouth daily.  ? nitroGLYCERIN (NITROSTAT) 0.4 MG SL tablet Place 0.4 mg under the tongue every 5 (five) minutes x 3 doses as needed for chest pain.  ? omega-3 acid ethyl esters (LOVAZA) 1 g capsule TAKE 2 CAPSULES BY MOUTH TWICE DAILY  ? pravastatin (PRAVACHOL) 20 MG tablet Take 1 tablet (20 mg total) by mouth every evening.  ?  ? ?Allergies:   Promethazine and Crestor [rosuvastatin]  ? ?Social History  ? ?Socioeconomic History  ? Marital status: Married  ?  Spouse name: Not on file  ? Number of children: Not on file  ? Years of education: Not on file  ? Highest education level: Not on file  ?Occupational History  ? Not on file  ?Tobacco Use  ? Smoking status: Never  ?  Passive exposure: Never  ? Smokeless tobacco: Never  ?Vaping Use  ? Vaping Use: Never used  ?Substance and Sexual Activity  ? Alcohol use: Never  ? Drug use: Never  ? Sexual activity: Not on file  ?Other Topics Concern  ? Not on file  ?Social History Narrative  ? Not on file  ? ?Social Determinants of Health  ? ?Financial Resource Strain: Not on file  ?Food Insecurity: Not on file  ?Transportation Needs: Not on file  ?Physical Activity: Not on file  ?Stress: Not on file  ?Social Connections: Not on file  ?  ? ?Family History: ?The patient's family history includes Breast cancer in his mother; CAD in his father; Fibromyalgia in his sister. ?ROS:   ?Please see the history of present illness.    ?All other systems reviewed and are negative. ? ?EKGs/Labs/Other Studies Reviewed:   ? ?The following studies were reviewed today: ? ? ?Recent Labs: ?10/14/2020: Magnesium 1.9 ?10/15/2020: Hemoglobin 11.3; Platelets 193 ?02/20/2021: ALT 29; BUN 17; Creatinine, Ser 1.15; Potassium 4.3; Sodium 136  ?Recent Lipid Panel ?   ?Component Value Date/Time  ? CHOL 113 02/20/2021 0934  ? TRIG 133 02/20/2021 0934  ? HDL 45 02/20/2021 0934  ? CHOLHDL 2.5  02/20/2021 0934  ? CHOLHDL 2.6 10/09/2020 0325  ? VLDL 23 10/09/2020 0325  ? LDLCALC 45 02/20/2021 0934  ? ? ?Physical Exam:   ? ?VS:  BP (!) 154/86   Pulse 64   Ht 6' (1.829 m)   Wt 249 lb 6.4 oz (113.1 kg)   SpO2

## 2021-06-09 DIAGNOSIS — M109 Gout, unspecified: Secondary | ICD-10-CM | POA: Diagnosis not present

## 2021-08-24 DIAGNOSIS — S86911A Strain of unspecified muscle(s) and tendon(s) at lower leg level, right leg, initial encounter: Secondary | ICD-10-CM | POA: Diagnosis not present

## 2021-08-28 DIAGNOSIS — M25561 Pain in right knee: Secondary | ICD-10-CM | POA: Diagnosis not present

## 2021-10-06 DIAGNOSIS — J8410 Pulmonary fibrosis, unspecified: Secondary | ICD-10-CM | POA: Diagnosis not present

## 2021-10-06 DIAGNOSIS — Z9989 Dependence on other enabling machines and devices: Secondary | ICD-10-CM | POA: Diagnosis not present

## 2021-10-06 DIAGNOSIS — R0609 Other forms of dyspnea: Secondary | ICD-10-CM | POA: Diagnosis not present

## 2021-10-06 DIAGNOSIS — G4733 Obstructive sleep apnea (adult) (pediatric): Secondary | ICD-10-CM | POA: Diagnosis not present

## 2021-10-11 DIAGNOSIS — R0609 Other forms of dyspnea: Secondary | ICD-10-CM | POA: Diagnosis not present

## 2021-10-11 DIAGNOSIS — G4733 Obstructive sleep apnea (adult) (pediatric): Secondary | ICD-10-CM | POA: Diagnosis not present

## 2021-10-11 DIAGNOSIS — Z9989 Dependence on other enabling machines and devices: Secondary | ICD-10-CM | POA: Diagnosis not present

## 2021-10-11 DIAGNOSIS — J8410 Pulmonary fibrosis, unspecified: Secondary | ICD-10-CM | POA: Diagnosis not present

## 2021-10-25 DIAGNOSIS — J8410 Pulmonary fibrosis, unspecified: Secondary | ICD-10-CM | POA: Diagnosis not present

## 2021-11-14 DIAGNOSIS — M25561 Pain in right knee: Secondary | ICD-10-CM | POA: Diagnosis not present

## 2021-11-20 ENCOUNTER — Other Ambulatory Visit: Payer: Self-pay | Admitting: Cardiology

## 2021-11-20 NOTE — Telephone Encounter (Signed)
Rx refill sent to pharmacy. 

## 2021-11-26 IMAGING — DX DG CHEST 1V PORT
1 series · 1 of 1 positions shown · non-contrast
Comparison: October 12, 2020.

CLINICAL DATA: Status post cardiac surgery.

EXAM:
PORTABLE CHEST 1 VIEW

[chest]
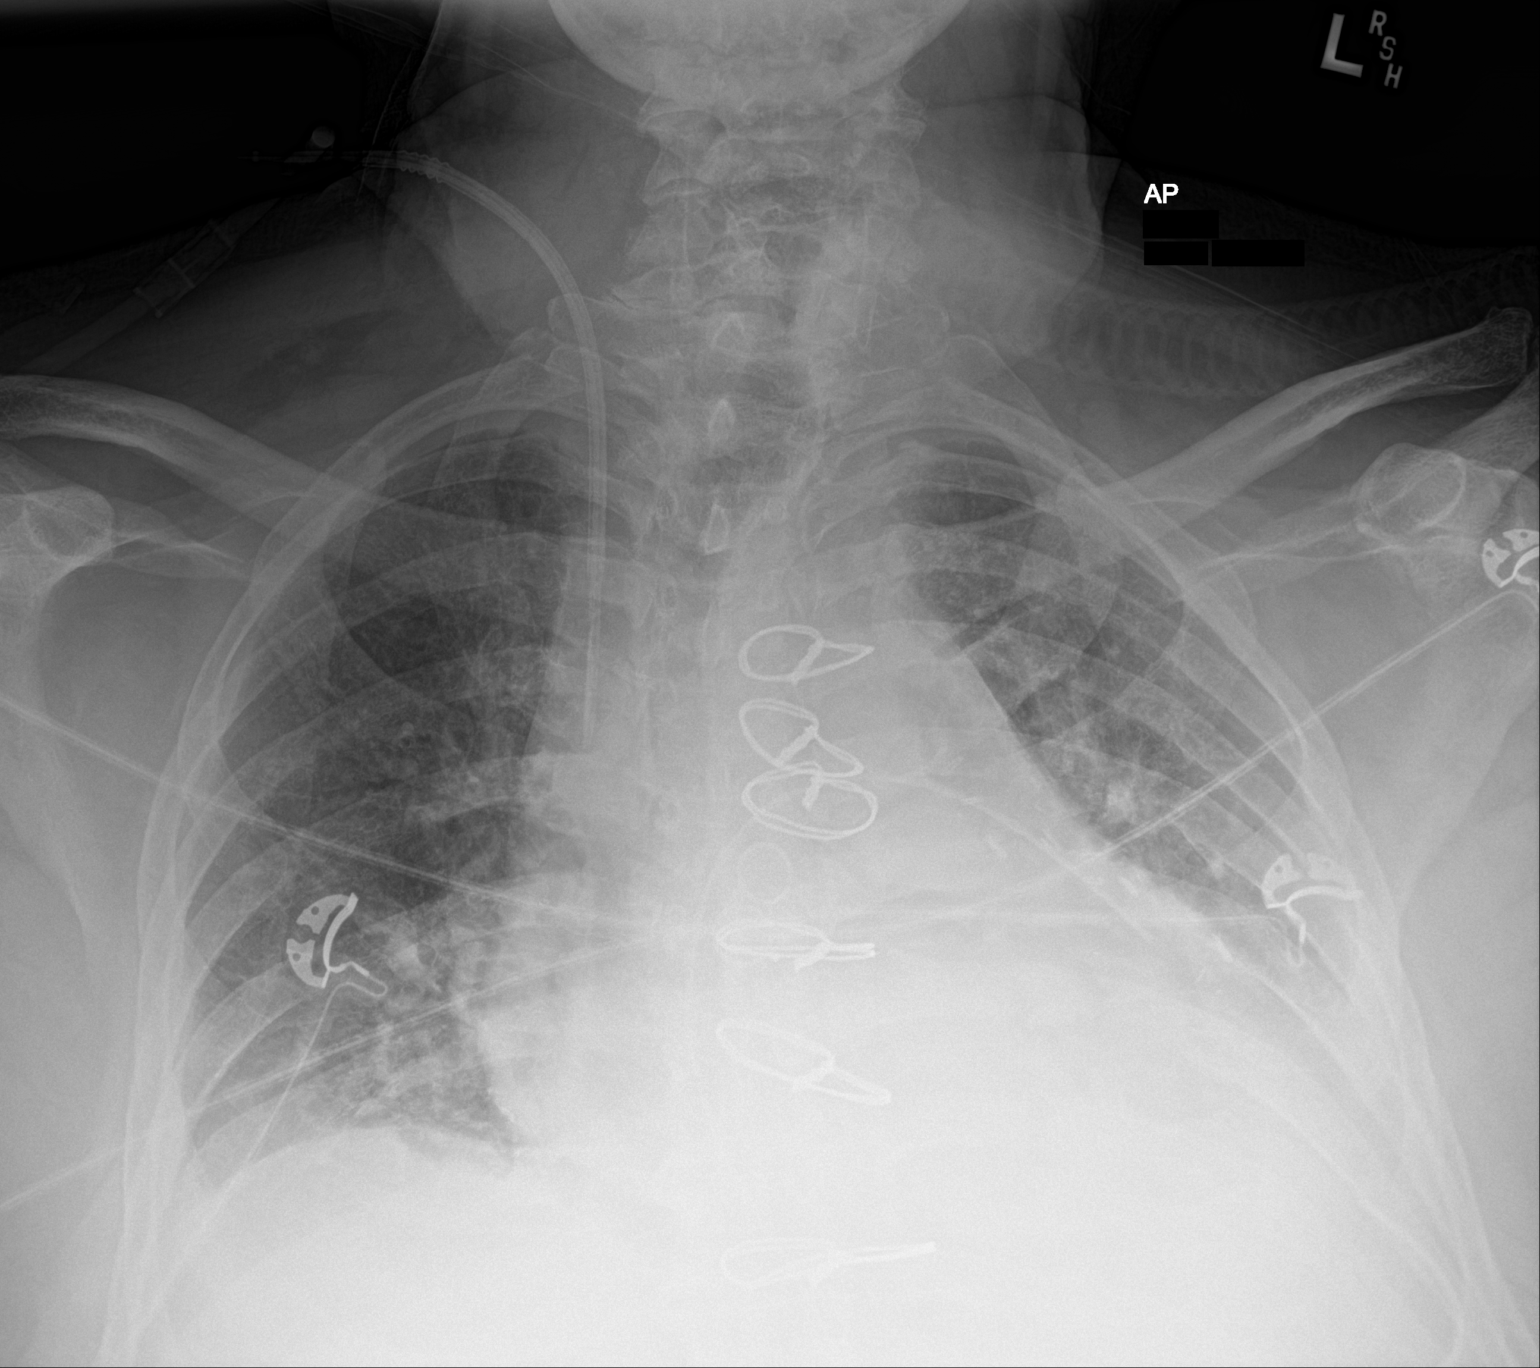

[1 of 1 positions shown; findings below may reference images not displayed]

FINDINGS: Stable cardiomegaly. Status post coronary bypass graft. Right
internal jugular catheter is unchanged. No pneumothorax is noted.
Bibasilar subsegmental atelectasis is noted. Bony thorax is
unremarkable.
IMPRESSION: Bibasilar subsegmental atelectasis.

## 2021-11-30 DIAGNOSIS — G8929 Other chronic pain: Secondary | ICD-10-CM | POA: Diagnosis not present

## 2021-11-30 DIAGNOSIS — M1711 Unilateral primary osteoarthritis, right knee: Secondary | ICD-10-CM | POA: Diagnosis not present

## 2021-11-30 DIAGNOSIS — M25561 Pain in right knee: Secondary | ICD-10-CM | POA: Diagnosis not present

## 2021-11-30 DIAGNOSIS — R0609 Other forms of dyspnea: Secondary | ICD-10-CM | POA: Diagnosis not present

## 2021-11-30 DIAGNOSIS — M2351 Chronic instability of knee, right knee: Secondary | ICD-10-CM | POA: Diagnosis not present

## 2021-11-30 DIAGNOSIS — J8410 Pulmonary fibrosis, unspecified: Secondary | ICD-10-CM | POA: Diagnosis not present

## 2021-11-30 DIAGNOSIS — G4733 Obstructive sleep apnea (adult) (pediatric): Secondary | ICD-10-CM | POA: Diagnosis not present

## 2021-12-05 DIAGNOSIS — M7121 Synovial cyst of popliteal space [Baker], right knee: Secondary | ICD-10-CM | POA: Diagnosis not present

## 2021-12-05 DIAGNOSIS — Y9389 Activity, other specified: Secondary | ICD-10-CM | POA: Diagnosis not present

## 2021-12-05 DIAGNOSIS — M25461 Effusion, right knee: Secondary | ICD-10-CM | POA: Diagnosis not present

## 2021-12-05 DIAGNOSIS — S83281A Other tear of lateral meniscus, current injury, right knee, initial encounter: Secondary | ICD-10-CM | POA: Diagnosis not present

## 2021-12-05 DIAGNOSIS — X58XXXA Exposure to other specified factors, initial encounter: Secondary | ICD-10-CM | POA: Diagnosis not present

## 2021-12-05 DIAGNOSIS — S83271A Complex tear of lateral meniscus, current injury, right knee, initial encounter: Secondary | ICD-10-CM | POA: Diagnosis not present

## 2021-12-05 DIAGNOSIS — S83511A Sprain of anterior cruciate ligament of right knee, initial encounter: Secondary | ICD-10-CM | POA: Diagnosis not present

## 2021-12-12 ENCOUNTER — Telehealth: Payer: Self-pay

## 2021-12-12 DIAGNOSIS — S83281A Other tear of lateral meniscus, current injury, right knee, initial encounter: Secondary | ICD-10-CM | POA: Diagnosis not present

## 2021-12-12 DIAGNOSIS — S83511A Sprain of anterior cruciate ligament of right knee, initial encounter: Secondary | ICD-10-CM | POA: Diagnosis not present

## 2021-12-12 NOTE — Telephone Encounter (Signed)
   Name: Phillip Terry  DOB: [birth year]  MRN: [id number]  Primary Cardiologist: Shirlee More, MD   Preoperative team, please contact this patient and set up a phone call appointment for further preoperative risk assessment. Please obtain consent and complete medication review. Thank you for your help.  I confirm that guidance regarding antiplatelet and oral anticoagulation therapy has been completed and, if necessary, noted below. Not on [place] for Afib.    Christell Faith, PA-C 12/12/2021, 11:07 AM [place]

## 2021-12-12 NOTE — Telephone Encounter (Signed)
   Pre-operative Risk Assessment    Patient Name: Phillip Terry  DOB: 31-Dec-1945 MRN: [id number]      Request for Surgical Clearance    Procedure:   Rt Knee Scope, Meniscectomy  Date of Surgery:  Clearance 01/10/22                                 Surgeon:  Lara Mulch, MD Surgeon's Group or Practice Name:  [facility] Phone number:  [phone] Fax number:  [phone] and [phone]   Type of Clearance Requested:   - Medical    Type of Anesthesia:   Choice   Additional requests/questions:  Please fax a copy of form to the surgeon's office.  Signed, Toni Arthurs   12/12/2021, 10:41 AM

## 2021-12-12 NOTE — Telephone Encounter (Signed)
Pt has appt 12/14/21 with Dr. Bettina Gavia. I have added will need pre op clearance please. Once Dr. Bettina Gavia has cleared the pt he will have his nurse/cma fax over the ov notes giving clearance and any medication recommendations.

## 2021-12-13 NOTE — Progress Notes (Unsigned)
Cardiology Office Note:    Date:  12/14/2021   ID:  Phillip Terry, DOB 1945/10/12, MRN [id number]  PCP:  Phillip Fusi, MD  Cardiologist:  Phillip Herrlich, MD    Referring MD: Phillip Fusi, MD    ASSESSMENT:    1. Preoperative cardiovascular examination   2. Coronary artery disease of native artery of native heart with stable angina pectoris (HCC)   3. Essential hypertension   4. Mixed hyperlipidemia   5. PAF (paroxysmal atrial fibrillation) (HCC)    PLAN:    In order of problems listed above:  Phillip Terry has made a good recovery from his CABG normal exercise tolerance having no cardiovascular symptoms and he is optimized for his planned surgical procedure EKG today the first since his surgery most consistent with repolarization I do not think he requires any further cardiac testing prior to surgery.  Postoperatively with a history of previous atrial fibrillation I have placed in a monitored bed an EKG postoperative day 1 any problems contact heart care.  He has been off aspirin he will resume he can hold it in the perioperative period usually restored 24 to 48 hours afterwards and continue his usual cardiac medications Continue his multiple antihypertensive medications calcium channel blocker ARB beta-blocker Continue with statin in the perioperative period Monitor on telemetry 24 to 48 hours after surgery with a history of previous atrial fibrillation   Next appointment: Routinely 9 months   Medication Adjustments/Labs and Tests Ordered: Current medicines are reviewed at length with the patient today.  Concerns regarding medicines are outlined above.  No orders of the defined types were placed in this encounter.  No orders of the defined types were placed in this encounter.   Chief Complaint  Patient presents with   Follow-up   Pre-op Exam     Medication Adjustments/Labs and Tests Ordered: Current medicines are reviewed at length with the patient today.  Concerns  regarding medicines are outlined above.  Orders Placed This Encounter  Procedures   EKG 12-Lead   No orders of the defined types were placed in this encounter.   Chief Complaint  Patient presents with   Follow-up   Pre-op Exam    History of Present Illness:    Phillip Terry is a 76 y.o. male with a hx of CAD CABG 10/11/2020 for multivessel CAD pulmonary fibrosis hypertension hyperlipidemia and paroxysmal atrial fibrillation last seen 05/05/2021.  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 is not having angina shortness of breath edema chest pain or syncope  Compliance with diet, lifestyle and medications: Yes Past Medical History:  Diagnosis Date   CAD (coronary artery disease)    CAD in native artery    Calculus of kidney    Chest pain 08/22/2020   DOE (dyspnea on exertion) 10/27/2019   Encounter for fitting and adjustment of hearing aid 05/04/2021   Encounter for other administrative examinations 05/04/2021   Essential hypertension    Gout 05/04/2021   Hearing loss 05/04/2021   Hepatic steatosis    History of asbestos exposure 05/04/2021   Hyperglycemia 10/07/2020   Hyperlipidemia    Obesity 05/04/2021   OSA on CPAP 03/16/2007   RDI 39 Sat 89%   Peripheral neuropathy    Pulmonary fibrosis (HCC) 10/27/2019   S/P CABG x 3 10/14/2020   Sensorineural hearing loss, bilateral 05/04/2021   Unstable angina (HCC) 10/07/2020    Past Surgical History:  Procedure Laterality Date   CATARACT EXTRACTION Bilateral  CORONARY ARTERY BYPASS GRAFT N/A 10/11/2020   Procedure: CORONARY ARTERY BYPASS GRAFTING (CABG), ON PUMP, TIMES THREE, USING LEFT INTERNAL MAMMARY ARTERY AND RIGHT ENDOSCOPICALLY HARVESTED GREATER SAPHENOUS VEIN;  Surgeon: Phillip Skains, MD;  Location: [facility] OR;  Service: Open Heart Surgery;  Laterality: N/A;   FOOT SURGERY Left 2012   gastronemius slide procedure   LEFT HEART CATH AND CORONARY ANGIOGRAPHY N/A 09/23/2020   Procedure: LEFT  HEART CATH AND CORONARY ANGIOGRAPHY;  Surgeon: Phillip Crafts, MD;  Location: [facility] INVASIVE CV LAB;  Service: Cardiovascular;  Laterality: N/A;   LEG SURGERY Left 2012   Gastronemius slide procedure   TEE WITHOUT CARDIOVERSION N/A 10/11/2020   Procedure: TRANSESOPHAGEAL ECHOCARDIOGRAM (TEE);  Surgeon: Phillip Skains, MD;  Location: [facility] OR;  Service: Open Heart Surgery;  Laterality: N/A;   TONSILLECTOMY      Current Medications: Current Meds  Medication Sig   amLODipine-valsartan (EXFORGE) 10-320 MG tablet Take 1 tablet by mouth at bedtime.   febuxostat (ULORIC) 40 MG tablet Take 40 mg by mouth at bedtime.   fenofibrate 160 MG tablet Take 160 mg by mouth at bedtime.   metoprolol tartrate (LOPRESSOR) 50 MG tablet Take 50 mg by mouth in the morning and at bedtime.   niacinamide 500 MG tablet Take 500 mg by mouth daily.   nitroGLYCERIN (NITROSTAT) 0.4 MG SL tablet Place 0.4 mg under the tongue every 5 (five) minutes x 3 doses as needed for chest pain.   omega-3 acid ethyl esters (LOVAZA) 1 g capsule TAKE 2 CAPSULES BY MOUTH TWICE DAILY   pravastatin (PRAVACHOL) 20 MG tablet Take 1 tablet (20 mg total) by mouth every evening.     Allergies:   Promethazine and Crestor [rosuvastatin]   Social History   Socioeconomic History   Marital status: Married    Spouse name: Not on file   Number of children: Not on file   Years of education: Not on file   Highest education level: Not on file  Occupational History   Not on file  Tobacco Use   Smoking status: Never    Passive exposure: Never   Smokeless tobacco: Never  Vaping Use   Vaping Use: Never used  Substance and Sexual Activity   Alcohol use: Never   Drug use: Never   Sexual activity: Not on file  Other Topics Concern   Not on file  Social History Narrative   Not on file   Social Determinants of Health   Financial Resource Strain: Not on file  Food Insecurity: Not on file  Transportation Needs: Not on file  Physical  Activity: Not on file  Stress: Not on file  Social Connections: Not on file     Family History: The patient's family history includes Breast cancer in his mother; CAD in his father; Fibromyalgia in his sister. ROS:   Please see the history of present illness.    All other systems reviewed and are negative.  EKGs/Labs/Other Studies Reviewed:    The following studies were reviewed today:  EKG:  EKG ordered today and personally reviewed.  The ekg ordered today demonstrates sinus rhythm ST-T abnormality, previous EKG showed atrial fibrillation pattern is most consistent with repolarization  Recent Labs: 02/20/2021: ALT 29; BUN 17; Creatinine, Ser 1.15; Potassium 4.3; Sodium 136  Recent Lipid Panel    Component Value Date/Time   CHOL 113 02/20/2021 0934   TRIG 133 02/20/2021 0934   HDL 45 02/20/2021 0934   CHOLHDL 2.5 02/20/2021 0934  CHOLHDL 2.6 10/09/2020 0325   VLDL 23 10/09/2020 0325   LDLCALC 45 02/20/2021 0934    Physical Exam:    VS:  BP (!) 152/78 (BP Location: Right Arm, Patient Position: Sitting)   Pulse (!) 50   Ht 6' (1.829 m)   Wt 249 lb 9.6 oz (113.2 kg)   SpO2 98%   BMI 33.85 kg/m     Wt Readings from Last 3 Encounters:  12/14/21 249 lb 9.6 oz (113.2 kg)  05/05/21 249 lb 6.4 oz (113.1 kg)  02/20/21 252 lb 12.8 oz (114.7 kg)     GEN:  Well nourished, well developed in no acute distress HEENT: Normal NECK: No JVD; No carotid bruits LYMPHATICS: No lymphadenopathy CARDIAC: RRR, no murmurs, rubs, gallops RESPIRATORY:  Clear to auscultation without rales, wheezing or rhonchi  ABDOMEN: Soft, non-tender, non-distended MUSCULOSKELETAL:  No edema; No deformity  SKIN: Warm and dry NEUROLOGIC:  Alert and oriented x 3 PSYCHIATRIC:  Normal affect    Signed, Shirlee More, MD  12/14/2021 11:54 AM    [place]

## 2021-12-14 ENCOUNTER — Ambulatory Visit: Payer: Medicare PPO | Attending: Cardiology | Admitting: Cardiology

## 2021-12-14 ENCOUNTER — Encounter: Payer: Self-pay | Admitting: Cardiology

## 2021-12-14 VITALS — BP 152/78 | HR 50 | Ht 72.0 in | Wt 249.6 lb

## 2021-12-14 DIAGNOSIS — I10 Essential (primary) hypertension: Secondary | ICD-10-CM

## 2021-12-14 DIAGNOSIS — Z01810 Encounter for preprocedural cardiovascular examination: Secondary | ICD-10-CM

## 2021-12-14 DIAGNOSIS — E782 Mixed hyperlipidemia: Secondary | ICD-10-CM

## 2021-12-14 DIAGNOSIS — I480 Paroxysmal atrial fibrillation: Secondary | ICD-10-CM

## 2021-12-14 DIAGNOSIS — I25118 Atherosclerotic heart disease of native coronary artery with other forms of angina pectoris: Secondary | ICD-10-CM | POA: Diagnosis not present

## 2021-12-14 NOTE — Patient Instructions (Signed)
Medication Instructions:  Your physician has recommended you make the following change in your medication:   RESUME: Aspirin 81mg  enteric coated - take 1 every other day   START: Multivitamin 1 daily   Lab Work: None Ordered If you have labs (blood work) drawn today and your tests are completely normal, you will receive your results only by: [place] (if you have MyChart) OR A paper copy in the mail If you have any lab test that is abnormal or we need to change your treatment, we will call you to review the results.   Testing/Procedures: None Ordered   Follow-Up: At [facility], you and your health needs are our priority.  As part of our continuing mission to provide you with exceptional heart care, we have created designated Provider Care Teams.  These Care Teams include your primary Cardiologist (physician) and Advanced Practice Providers (APPs -  Physician Assistants and Nurse Practitioners) who all work together to provide you with the care you need, when you need it.  We recommend signing up for the patient portal called "MyChart".  Sign up information is provided on this After Visit Summary.  MyChart is used to connect with patients for Virtual Visits (Telemedicine).  Patients are able to view lab/test results, encounter notes, upcoming appointments, etc.  Non-urgent messages can be sent to your provider as well.   To learn more about what you can do with MyChart, go to [email or web address].    Your next appointment:   9 month(s)  The format for your next appointment:   In Person  Provider:   Shirlee More, MD    Other Instructions NA

## 2021-12-29 ENCOUNTER — Telehealth: Payer: Self-pay

## 2021-12-29 NOTE — Telephone Encounter (Signed)
Patient brought a letter by the office and wanted Dr. Dulce Sellar to provide an expert opinion. Dr. Dulce Sellar informed me that he doesn't do expert opinion's. I called the patient and informed him that Dr. Dulce Sellar doesn't do expert opinions. Patient had no further questions at this time.

## 2022-01-10 DIAGNOSIS — M65861 Other synovitis and tenosynovitis, right lower leg: Secondary | ICD-10-CM | POA: Diagnosis not present

## 2022-01-10 DIAGNOSIS — G8918 Other acute postprocedural pain: Secondary | ICD-10-CM | POA: Diagnosis not present

## 2022-01-10 DIAGNOSIS — S83231A Complex tear of medial meniscus, current injury, right knee, initial encounter: Secondary | ICD-10-CM | POA: Diagnosis not present

## 2022-01-10 DIAGNOSIS — M2241 Chondromalacia patellae, right knee: Secondary | ICD-10-CM | POA: Diagnosis not present

## 2022-01-10 DIAGNOSIS — S83271A Complex tear of lateral meniscus, current injury, right knee, initial encounter: Secondary | ICD-10-CM | POA: Diagnosis not present

## 2022-01-10 DIAGNOSIS — M6751 Plica syndrome, right knee: Secondary | ICD-10-CM | POA: Diagnosis not present

## 2022-01-10 DIAGNOSIS — M659 Synovitis and tenosynovitis, unspecified: Secondary | ICD-10-CM | POA: Diagnosis not present

## 2022-01-10 DIAGNOSIS — M94261 Chondromalacia, right knee: Secondary | ICD-10-CM | POA: Diagnosis not present

## 2022-01-17 DIAGNOSIS — M6281 Muscle weakness (generalized): Secondary | ICD-10-CM | POA: Diagnosis not present

## 2022-01-17 DIAGNOSIS — M25561 Pain in right knee: Secondary | ICD-10-CM | POA: Diagnosis not present

## 2022-01-25 DIAGNOSIS — M6281 Muscle weakness (generalized): Secondary | ICD-10-CM | POA: Diagnosis not present

## 2022-01-25 DIAGNOSIS — M25561 Pain in right knee: Secondary | ICD-10-CM | POA: Diagnosis not present

## 2022-01-31 DIAGNOSIS — M25561 Pain in right knee: Secondary | ICD-10-CM | POA: Diagnosis not present

## 2022-01-31 DIAGNOSIS — M6281 Muscle weakness (generalized): Secondary | ICD-10-CM | POA: Diagnosis not present

## 2022-02-01 ENCOUNTER — Ambulatory Visit: Payer: Medicare PPO | Admitting: Cardiology

## 2022-02-15 DIAGNOSIS — J8410 Pulmonary fibrosis, unspecified: Secondary | ICD-10-CM | POA: Diagnosis not present

## 2022-02-15 DIAGNOSIS — G4733 Obstructive sleep apnea (adult) (pediatric): Secondary | ICD-10-CM | POA: Diagnosis not present

## 2022-02-15 DIAGNOSIS — R0609 Other forms of dyspnea: Secondary | ICD-10-CM | POA: Diagnosis not present

## 2022-04-24 DIAGNOSIS — Z9889 Other specified postprocedural states: Secondary | ICD-10-CM | POA: Diagnosis not present

## 2022-04-24 DIAGNOSIS — M25561 Pain in right knee: Secondary | ICD-10-CM | POA: Diagnosis not present

## 2022-05-02 ENCOUNTER — Other Ambulatory Visit: Payer: Self-pay

## 2022-05-02 MED ORDER — PRAVASTATIN SODIUM 20 MG PO TABS
20.0000 mg | ORAL_TABLET | Freq: Every evening | ORAL | 1 refills | Status: DC
Start: 2022-05-02 — End: 2022-09-11

## 2022-07-02 ENCOUNTER — Encounter: Payer: Self-pay | Admitting: Cardiology

## 2022-07-02 NOTE — Telephone Encounter (Signed)
Error

## 2022-08-05 ENCOUNTER — Other Ambulatory Visit: Payer: Self-pay | Admitting: Cardiology

## 2022-08-24 DIAGNOSIS — I10 Essential (primary) hypertension: Secondary | ICD-10-CM | POA: Diagnosis not present

## 2022-08-24 DIAGNOSIS — E785 Hyperlipidemia, unspecified: Secondary | ICD-10-CM | POA: Diagnosis not present

## 2022-08-24 DIAGNOSIS — G4733 Obstructive sleep apnea (adult) (pediatric): Secondary | ICD-10-CM | POA: Diagnosis not present

## 2022-08-24 DIAGNOSIS — R7301 Impaired fasting glucose: Secondary | ICD-10-CM | POA: Diagnosis not present

## 2022-08-24 DIAGNOSIS — I2510 Atherosclerotic heart disease of native coronary artery without angina pectoris: Secondary | ICD-10-CM | POA: Diagnosis not present

## 2022-08-24 DIAGNOSIS — Z125 Encounter for screening for malignant neoplasm of prostate: Secondary | ICD-10-CM | POA: Diagnosis not present

## 2022-09-10 NOTE — Progress Notes (Signed)
Cardiology Office Note:    Date:  09/11/2022   ID:  Phillip Terry, DOB Feb 11, 1946, MRN [id number]  PCP:  Paulina Fusi, MD  Cardiologist:  Norman Herrlich, MD    Referring MD: Paulina Fusi, MD    ASSESSMENT:    1. Coronary artery disease of native artery of native heart with stable angina pectoris (HCC)   2. S/P CABG x 3   3. Essential hypertension   4. Mixed hyperlipidemia   5. PAF (paroxysmal atrial fibrillation) (HCC)   6. Pulmonary fibrosis (HCC)    PLAN:    In order of problems listed above:  Dekwan continues to do well stable CAD continue his treatment including aspirin beta-blocker and lipid-lowering with fenofibrate.  He is statin intolerant. Blood pressure target continue current treatment amlodipine and ARB beta-blocker Continue current nonstatin lipid-lowering treatment No recurrence of atrial fibrillation Recheck echocardiogram with his severe shortness of breath bending over but likely more due to pulmonary fibrosis and LV dysfunction   Next appointment: 6 months   Medication Adjustments/Labs and Tests Ordered: Current medicines are reviewed at length with the patient today.  Concerns regarding medicines are outlined above.  Orders Placed This Encounter  Procedures   ECHOCARDIOGRAM COMPLETE   No orders of the defined types were placed in this encounter.    History of Present Illness:    Phillip Terry is a 77 y.o. male with a hx of CAD with CABG August 2022 for multivessel coronary artery disease pulmonary fibrosis hypertension hyperlipidemia paroxysmal atrial fibrillation last seen  /12/14/2021.  Rate labs 08/24/2022 showed CBC with a hemoglobin of 14.8 platelets 190,000 creatinine 1.11 GFR 69 cc/min lipid profile with a total cholesterol 134 LDL 73 hemoglobin A1c 6.0%.    Compliance with diet, lifestyle and medications: Yes  Overall he has done well except he struggles when he bends over he finds himself very short of breath cannot tie his shoes.  I  am unsure if this is cardiac in etiology can be seen with decompensated heart failure or more related to his lung disease it has been 2 years and we will recheck an echocardiogram for left ventricular systolic and diastolic function although he has no edema or physical findings of heart failure.  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 shortness of breath not severe limiting nonprogressive no edema orthopnea chest pain palpitation or syncope  He tolerates his lipid-lowering therapy without muscle pain or weakness Past Medical History:  Diagnosis Date   Atherosclerotic heart disease of native coronary artery with unspecified angina pectoris (HCC) 05/04/2021   CAD (coronary artery disease)    CAD in native artery    Calculus of kidney    Chest pain 08/22/2020   DOE (dyspnea on exertion) 10/27/2019   Encounter for fitting and adjustment of hearing aid 05/04/2021   Encounter for other administrative examinations 05/04/2021   Essential hypertension    Gout 05/04/2021   Hearing loss 05/04/2021   Hepatic steatosis    History of asbestos exposure 05/04/2021   Hyperglycemia 10/07/2020   Hyperlipidemia    Obesity 05/04/2021   OSA on CPAP 03/16/2007   RDI 39 Sat 89%   Peripheral neuropathy    Pulmonary fibrosis (HCC) 10/27/2019   S/P CABG x 3 10/14/2020   Sensorineural hearing loss, bilateral 05/04/2021   Unstable angina (HCC) 10/07/2020    Current Medications: Current Meds  Medication Sig   amLODipine-valsartan (EXFORGE) 5-320 MG tablet Take 1 tablet by mouth daily.  aspirin EC 81 MG tablet Take 1 tablet (81 mg total) by mouth daily. Swallow whole.   febuxostat (ULORIC) 40 MG tablet Take 40 mg by mouth at bedtime.   fenofibrate 160 MG tablet Take 160 mg by mouth at bedtime.   metoprolol tartrate (LOPRESSOR) 50 MG tablet Take 50 mg by mouth in the morning and at bedtime.   nitroGLYCERIN (NITROSTAT) 0.4 MG SL tablet Place 0.4 mg under the tongue every 5  (five) minutes x 3 doses as needed for chest pain.      EKGs/Labs/Other Studies Reviewed:    The following studies were reviewed today:         Recent Labs: No results found for requested labs within last 365 days.  Recent Lipid Panel    Component Value Date/Time   CHOL 113 02/20/2021 0934   TRIG 133 02/20/2021 0934   HDL 45 02/20/2021 0934   CHOLHDL 2.5 02/20/2021 0934   CHOLHDL 2.6 10/09/2020 0325   VLDL 23 10/09/2020 0325   LDLCALC 45 02/20/2021 0934    Physical Exam:    VS:  BP 132/66   Pulse 60   Ht 6' (1.829 m)   Wt 242 lb 6.4 oz (110 kg)   SpO2 94%   BMI 32.88 kg/m     Wt Readings from Last 3 Encounters:  09/11/22 242 lb 6.4 oz (110 kg)  12/14/21 249 lb 9.6 oz (113.2 kg)  05/05/21 249 lb 6.4 oz (113.1 kg)     GEN:  Well nourished, well developed in no acute distress HEENT: Normal NECK: No JVD; No carotid bruits LYMPHATICS: No lymphadenopathy CARDIAC: RRR, no murmurs, rubs, gallops RESPIRATORY:  Clear to auscultation without rales, wheezing or rhonchi  ABDOMEN: Soft, non-tender, non-distended MUSCULOSKELETAL:  No edema; No deformity  SKIN: Warm and dry NEUROLOGIC:  Alert and oriented x 3 PSYCHIATRIC:  Normal affect    Signed, Norman Herrlich, MD  09/11/2022 11:47 AM    [facility]

## 2022-09-11 ENCOUNTER — Ambulatory Visit: Payer: Medicare PPO | Attending: Cardiology | Admitting: Cardiology

## 2022-09-11 ENCOUNTER — Encounter: Payer: Self-pay | Admitting: Cardiology

## 2022-09-11 VITALS — BP 132/66 | HR 60 | Ht 72.0 in | Wt 242.4 lb

## 2022-09-11 DIAGNOSIS — Z951 Presence of aortocoronary bypass graft: Secondary | ICD-10-CM | POA: Diagnosis not present

## 2022-09-11 DIAGNOSIS — I25118 Atherosclerotic heart disease of native coronary artery with other forms of angina pectoris: Secondary | ICD-10-CM | POA: Diagnosis not present

## 2022-09-11 DIAGNOSIS — E782 Mixed hyperlipidemia: Secondary | ICD-10-CM | POA: Diagnosis not present

## 2022-09-11 DIAGNOSIS — I10 Essential (primary) hypertension: Secondary | ICD-10-CM

## 2022-09-11 DIAGNOSIS — I480 Paroxysmal atrial fibrillation: Secondary | ICD-10-CM | POA: Diagnosis not present

## 2022-09-11 DIAGNOSIS — J8410 Pulmonary fibrosis, unspecified: Secondary | ICD-10-CM

## 2022-09-11 NOTE — Patient Instructions (Signed)
Medication Instructions:  Your physician recommends that you continue on your current medications as directed. Please refer to the Current Medication list given to you today.  *If you need a refill on your cardiac medications before your next appointment, please call your pharmacy*   Lab Work: None If you have labs (blood work) drawn today and your tests are completely normal, you will receive your results only by: MyChart Message (if you have MyChart) OR A paper copy in the mail If you have any lab test that is abnormal or we need to change your treatment, we will call you to review the results.   Testing/Procedures: Your physician has requested that you have an echocardiogram. Echocardiography is a painless test that uses sound waves to create images of your heart. It provides your doctor with information about the size and shape of your heart and how well your heart's chambers and valves are working. This procedure takes approximately one hour. There are no restrictions for this procedure. Please do NOT wear cologne, perfume, aftershave, or lotions (deodorant is allowed). Please arrive 15 minutes prior to your appointment time.    Follow-Up: At [facility], you and your health needs are our priority.  As part of our continuing mission to provide you with exceptional heart care, we have created designated Provider Care Teams.  These Care Teams include your primary Cardiologist (physician) and Advanced Practice Providers (APPs -  Physician Assistants and Nurse Practitioners) who all work together to provide you with the care you need, when you need it.  We recommend signing up for the patient portal called "MyChart".  Sign up information is provided on this After Visit Summary.  MyChart is used to connect with patients for Virtual Visits (Telemedicine).  Patients are able to view lab/test results, encounter notes, upcoming appointments, etc.  Non-urgent messages can be sent to your  provider as well.   To learn more about what you can do with MyChart, go to [email or web address].    Your next appointment:   6 month(s)  Provider:   Brian Munley, MD    Other Instructions None  

## 2022-09-28 DIAGNOSIS — D171 Benign lipomatous neoplasm of skin and subcutaneous tissue of trunk: Secondary | ICD-10-CM | POA: Diagnosis not present

## 2022-09-28 DIAGNOSIS — L821 Other seborrheic keratosis: Secondary | ICD-10-CM | POA: Diagnosis not present

## 2022-09-28 DIAGNOSIS — D485 Neoplasm of uncertain behavior of skin: Secondary | ICD-10-CM | POA: Diagnosis not present

## 2022-10-05 ENCOUNTER — Ambulatory Visit: Payer: Medicare PPO | Attending: Cardiology

## 2022-10-05 DIAGNOSIS — E782 Mixed hyperlipidemia: Secondary | ICD-10-CM

## 2022-10-05 DIAGNOSIS — Z951 Presence of aortocoronary bypass graft: Secondary | ICD-10-CM

## 2022-10-05 DIAGNOSIS — J8410 Pulmonary fibrosis, unspecified: Secondary | ICD-10-CM | POA: Diagnosis not present

## 2022-10-05 DIAGNOSIS — I25118 Atherosclerotic heart disease of native coronary artery with other forms of angina pectoris: Secondary | ICD-10-CM

## 2022-10-05 DIAGNOSIS — I10 Essential (primary) hypertension: Secondary | ICD-10-CM | POA: Diagnosis not present

## 2022-10-05 DIAGNOSIS — I480 Paroxysmal atrial fibrillation: Secondary | ICD-10-CM | POA: Diagnosis not present

## 2022-10-05 LAB — ECHOCARDIOGRAM COMPLETE
Area-P 1/2: 3.51 cm2
S' Lateral: 3.4 cm

## 2022-10-20 DIAGNOSIS — C44319 Basal cell carcinoma of skin of other parts of face: Secondary | ICD-10-CM | POA: Diagnosis not present

## 2022-11-01 ENCOUNTER — Other Ambulatory Visit: Payer: Self-pay | Admitting: Cardiology

## 2022-11-02 ENCOUNTER — Other Ambulatory Visit: Payer: Self-pay

## 2022-11-02 ENCOUNTER — Telehealth (HOSPITAL_BASED_OUTPATIENT_CLINIC_OR_DEPARTMENT_OTHER): Payer: Self-pay

## 2022-11-02 DIAGNOSIS — I719 Aortic aneurysm of unspecified site, without rupture: Secondary | ICD-10-CM

## 2022-11-06 ENCOUNTER — Telehealth: Payer: Self-pay

## 2022-11-06 ENCOUNTER — Other Ambulatory Visit: Payer: Self-pay

## 2022-11-06 NOTE — Telephone Encounter (Signed)
Patient came to the office and was concerned because he was told that his aorta was enlarged and he needed to have a test done to evaluate it. Patient also stated that he is hard of hearing and he may have missed part of the message due to his hearing. I explained that during one of his cardiac test they found that his aorta was enlarged and that Dr. Dulce Sellar had ordered a noncontrast CT to evaluate it. I explained that we do these test every day and that he should not be concerned. Once the CT is performed Dr. Dulce Sellar will review the results and then we will be able to explain what the next steps are regarding the enlargement of his aorta. Patient was agreeable with that plan and verbalized understanding and had no further questions at this time.

## 2022-11-12 ENCOUNTER — Ambulatory Visit (HOSPITAL_BASED_OUTPATIENT_CLINIC_OR_DEPARTMENT_OTHER)
Admission: RE | Admit: 2022-11-12 | Discharge: 2022-11-12 | Disposition: A | Discharge status: Home / Self Care | Payer: Medicare PPO | Source: Ambulatory Visit | Attending: Cardiology | Admitting: Cardiology

## 2022-11-12 DIAGNOSIS — K760 Fatty (change of) liver, not elsewhere classified: Secondary | ICD-10-CM | POA: Diagnosis not present

## 2022-11-12 DIAGNOSIS — I2510 Atherosclerotic heart disease of native coronary artery without angina pectoris: Secondary | ICD-10-CM | POA: Diagnosis not present

## 2022-11-12 DIAGNOSIS — Z136 Encounter for screening for cardiovascular disorders: Secondary | ICD-10-CM | POA: Insufficient documentation

## 2022-11-12 DIAGNOSIS — I719 Aortic aneurysm of unspecified site, without rupture: Secondary | ICD-10-CM | POA: Diagnosis present

## 2022-11-12 DIAGNOSIS — I77810 Thoracic aortic ectasia: Secondary | ICD-10-CM | POA: Insufficient documentation

## 2022-11-12 DIAGNOSIS — I700 Atherosclerosis of aorta: Secondary | ICD-10-CM | POA: Insufficient documentation

## 2022-11-12 DIAGNOSIS — Z951 Presence of aortocoronary bypass graft: Secondary | ICD-10-CM | POA: Diagnosis not present

## 2022-11-12 DIAGNOSIS — R590 Localized enlarged lymph nodes: Secondary | ICD-10-CM | POA: Diagnosis not present

## 2022-11-18 ENCOUNTER — Other Ambulatory Visit: Payer: Self-pay | Admitting: Cardiology

## 2022-11-19 ENCOUNTER — Other Ambulatory Visit: Payer: Self-pay

## 2022-11-19 DIAGNOSIS — I719 Aortic aneurysm of unspecified site, without rupture: Secondary | ICD-10-CM

## 2022-11-22 ENCOUNTER — Telehealth: Payer: Self-pay | Admitting: Cardiology

## 2022-11-22 ENCOUNTER — Other Ambulatory Visit: Payer: Self-pay

## 2022-11-22 ENCOUNTER — Other Ambulatory Visit: Payer: Self-pay | Admitting: Cardiology

## 2022-11-22 NOTE — Telephone Encounter (Signed)
Pt c/o medication issue:  1. Name of Medication: omega-3 acid ethyl esters (LOVAZA) 1 g capsule pravastatin (PRAVACHOL) 20 MG table 2. How are you currently taking this medication (dosage and times per day)? As Written  3. Are you having a reaction (difficulty breathing--STAT)? No  4. What is your medication issue [name] is calling because the patient is needing a refill on the both of these medications. [name] would like to know why they were taken off the medication list. [name] would like [place] to send the refill to the [facility] in [place]. Please advise.

## 2022-11-23 ENCOUNTER — Other Ambulatory Visit: Payer: Self-pay

## 2022-11-23 DIAGNOSIS — E782 Mixed hyperlipidemia: Secondary | ICD-10-CM

## 2022-11-23 NOTE — Telephone Encounter (Signed)
Called patient's wife and informed her that the patient had some digestive problems about a year ago after starting Lovaza and Dr. Dulce Sellar had discontinued the medication. Also Dr. Dulce Sellar had started the patient on Pravastatin but it was not listed on his medication list. Spoke to Wallis Bamberg, NP and she looked at the patient's labs and since they were over a year old she recommended that the patient have a lipid and CMP lab work and then we can see if the patient still needs the medication. I relayed this recommendation to the patient's wife and she was agreeable with this plan and had no further questions at this time.

## 2022-11-23 NOTE — Telephone Encounter (Signed)
Left message for the patient to call back.  

## 2022-11-24 LAB — LIPID PANEL
Chol/HDL Ratio: 5 ratio (ref 0.0–5.0)
Cholesterol, Total: 140 mg/dL (ref 100–199)
HDL: 28 mg/dL — ABNORMAL LOW
LDL Chol Calc (NIH): 74 mg/dL (ref 0–99)
Triglycerides: 231 mg/dL — ABNORMAL HIGH (ref 0–149)
VLDL Cholesterol Cal: 38 mg/dL (ref 5–40)

## 2022-11-24 LAB — COMPREHENSIVE METABOLIC PANEL WITH GFR
ALT: 29 IU/L (ref 0–44)
AST: 44 IU/L — ABNORMAL HIGH (ref 0–40)
Albumin: 4.2 g/dL (ref 3.8–4.8)
Alkaline Phosphatase: 145 IU/L — ABNORMAL HIGH (ref 44–121)
BUN/Creatinine Ratio: 13 (ref 10–24)
BUN: 15 mg/dL (ref 8–27)
Bilirubin Total: 0.5 mg/dL (ref 0.0–1.2)
CO2: 23 mmol/L (ref 20–29)
Calcium: 9.5 mg/dL (ref 8.6–10.2)
Chloride: 101 mmol/L (ref 96–106)
Creatinine, Ser: 1.18 mg/dL (ref 0.76–1.27)
Globulin, Total: 3 g/dL (ref 1.5–4.5)
Glucose: 96 mg/dL (ref 70–99)
Potassium: 4.6 mmol/L (ref 3.5–5.2)
Sodium: 136 mmol/L (ref 134–144)
Total Protein: 7.2 g/dL (ref 6.0–8.5)
eGFR: 64 mL/min/1.73

## 2022-12-28 ENCOUNTER — Other Ambulatory Visit: Payer: Self-pay | Admitting: Cardiology

## 2023-01-01 DIAGNOSIS — L821 Other seborrheic keratosis: Secondary | ICD-10-CM | POA: Diagnosis not present

## 2023-01-01 DIAGNOSIS — C44319 Basal cell carcinoma of skin of other parts of face: Secondary | ICD-10-CM | POA: Diagnosis not present

## 2023-02-27 DIAGNOSIS — E785 Hyperlipidemia, unspecified: Secondary | ICD-10-CM | POA: Diagnosis not present

## 2023-02-27 DIAGNOSIS — G4733 Obstructive sleep apnea (adult) (pediatric): Secondary | ICD-10-CM | POA: Diagnosis not present

## 2023-02-27 DIAGNOSIS — I10 Essential (primary) hypertension: Secondary | ICD-10-CM | POA: Diagnosis not present

## 2023-02-27 DIAGNOSIS — I2510 Atherosclerotic heart disease of native coronary artery without angina pectoris: Secondary | ICD-10-CM | POA: Diagnosis not present

## 2023-02-27 DIAGNOSIS — Z139 Encounter for screening, unspecified: Secondary | ICD-10-CM | POA: Diagnosis not present

## 2023-02-27 DIAGNOSIS — M109 Gout, unspecified: Secondary | ICD-10-CM | POA: Diagnosis not present

## 2023-02-27 DIAGNOSIS — R7301 Impaired fasting glucose: Secondary | ICD-10-CM | POA: Diagnosis not present

## 2023-03-12 ENCOUNTER — Encounter: Payer: Self-pay | Admitting: Cardiology

## 2023-03-12 DIAGNOSIS — H6123 Impacted cerumen, bilateral: Secondary | ICD-10-CM | POA: Insufficient documentation

## 2023-03-12 HISTORY — DX: Impacted cerumen, bilateral: H61.23

## 2023-03-13 NOTE — Progress Notes (Deleted)
 Cardiology Office Note:    Date:  03/13/2023   ID:  Phillip Terry, DOB 05/28/1945, MRN [id number]  PCP:  Paulina Fusi, MD  Cardiologist:  Norman Herrlich, MD    Referring MD: Paulina Fusi, MD    ASSESSMENT:    1. Coronary artery disease of native artery of native heart with stable angina pectoris (HCC)   2. S/P CABG x 3   3. Ascending aorta enlargement (HCC)   4. Essential hypertension   5. Mixed hyperlipidemia   6. Pulmonary fibrosis (HCC)    PLAN:    In order of problems listed above:  ***   Next appointment: ***   Medication Adjustments/Labs and Tests Ordered: Current medicines are reviewed at length with the patient today.  Concerns regarding medicines are outlined above.  No orders of the defined types were placed in this encounter.  No orders of the defined types were placed in this encounter.    History of Present Illness:    Phillip Terry is a 78 y.o. male with a hx of CAD with CABG August 2022 pulmonary fibrosis hypertension hyperlipidemia and paroxysmal atrial fibrillation without anticoagulation last seen 08/15/2022.  With continued shortness of breath and echocardiogram 10/05/2022 left ventricle normal in size mild LVH normal diastolic and systolic function EF 55 to 60% right ventricle is mildly enlarged normal systolic function and pulmonary artery pressure there is moderate enlargement of the ascending aorta and aortic root 40 mm.  He had a chest CT performed showing enlargement ascending aorta 42 mm hepatic steatosis and no acute consolidative airspace disease. Compliance with diet, lifestyle and medications: *** Past Medical History:  Diagnosis Date   Atherosclerotic heart disease of native coronary artery with unspecified angina pectoris (HCC) 05/04/2021   CAD (coronary artery disease)    CAD in native artery    Calculus of kidney    Chest pain 08/22/2020   DOE (dyspnea on exertion) 10/27/2019   Encounter for fitting and adjustment of hearing aid  05/04/2021   Encounter for other administrative examinations 05/04/2021   Essential hypertension    Gout 05/04/2021   Hearing loss 05/04/2021   Hepatic steatosis    History of asbestos exposure 05/04/2021   Hyperglycemia 10/07/2020   Hyperlipidemia    Impacted cerumen, bilateral 03/12/2023   Obesity 05/04/2021   OSA on CPAP 03/16/2007   RDI 39 Sat 89%   Peripheral neuropathy    Pulmonary fibrosis (HCC) 10/27/2019   S/P CABG x 3 10/14/2020   Sensorineural hearing loss, bilateral 05/04/2021   Unstable angina (HCC) 10/07/2020    Current Medications: Current Meds  Medication Sig   carbamide peroxide (DEBROX) 6.5 % OTIC solution Place 5 drops into both ears 2 (two) times daily.   niacin (SLO-NIACIN) 500 MG tablet Take 1 tablet by mouth daily.   pravastatin (PRAVACHOL) 20 MG tablet Take 1 tablet by mouth at bedtime.      EKGs/Labs/Other Studies Reviewed:    The following studies were reviewed today:  Cardiac Studies & Procedures   CARDIAC CATHETERIZATION  CARDIAC CATHETERIZATION 09/23/2020  Narrative   Prox RCA to Mid RCA lesion is 60-70% stenosed.   Dist Cx lesion is 50% stenosed.   Mid LAD lesion is 75% stenosed.  This is a long segment of disease starting immediately after the large diagonal branch.   Prox LAD lesion is 90% stenosed.   2nd Diag lesion is 80% stenosed.  This is a small vessel which likely could be treated medically.   RPAV lesion  is 50% stenosed.   The left ventricular systolic function is normal.   LV end diastolic pressure is normal.   The left ventricular ejection fraction is 55-65% by visual estimate.   There is no aortic valve stenosis.  Severe, calcific disease most notably in the proximal and mid LAD.  The long segment of disease in the mid LAD involves several diagonal vessel ostia.  Moderate disease in the RCA.  We will plan for cardiac surgery referral for consideration of robotic mid CAB procedure with LIMA to LAD.  I suspect his symptoms  would resolve with just that revascularization.  If symptoms persisted, the RCA is certainly treatable percutaneously and a hybrid approach to his revascularization could be considered.  Severe tortuosity of the right subclavian.  If intervention was needed, would consider using long destination sheath from the right radial to engage the RCA.  Engaging the left main was much easier.  Results discussed with the patient's daughter.  Echocardiogram will be obtained.  Referral made to Dr. Cliffton Asters.  Findings Coronary Findings Diagnostic  Dominance: Right  Left Anterior Descending The vessel exhibits minimal luminal irregularities. Prox LAD lesion is 90% stenosed. The lesion is severely calcified. Mid LAD lesion is 75% stenosed. The lesion is severely calcified.  Second Diagonal Branch Vessel is small in size. 2nd Diag lesion is 80% stenosed.  Left Circumflex Dist Cx lesion is 50% stenosed.  Right Coronary Artery Prox RCA to Mid RCA lesion is 60% stenosed.  Right Posterior Atrioventricular Artery RPAV lesion is 50% stenosed.  Intervention  No interventions have been documented.   STRESS TESTS  MYOCARDIAL PERFUSION IMAGING 08/26/2019  ECHOCARDIOGRAM  ECHOCARDIOGRAM COMPLETE 10/05/2022  Narrative ECHOCARDIOGRAM REPORT    Patient Name:   Phillip Terry Date of Exam: 10/05/2022 Medical Rec #:  [id number]  Height:       72.0 in Accession #:    [id number] Weight:       242.4 lb Date of Birth:  1945-03-29  BSA:          2.312 m Patient Age:    77 years   BP:           132/66 mmHg Patient Gender: M          HR:           62 bpm. Exam Location:  [place]  Procedure: 2D Echo, Cardiac Doppler, Color Doppler and Strain Analysis  Indications:    Coronary artery disease of native artery of native heart with stable angina pectoris (HCC) [I25.118 (ICD-10-CM)]; S/P CABG x 3 [Z95.1 (ICD-10-CM)]; Essential hypertension [I10 (ICD-10-CM)]; Mixed hyperlipidemia [E78.2 (ICD-10-CM)]; PAF  (paroxysmal atrial fibrillation) (HCC) [I48.0 (ICD-10-CM)]; Pulmonary fibrosis (HCC) [J84.10 (ICD-10-CM)]  History:        Patient has prior history of Echocardiogram examinations, most recent 10/07/2020. Prior CABG, Arrythmias:Atrial Fibrillation; Risk Factors:Hypertension and Dyslipidemia.  Sonographer:    Margreta Journey RDCS Referring Phys: [id number] Dominique Calvey J Luverna Degenhart  IMPRESSIONS   1. Left ventricular ejection fraction, by estimation, is 55 to 60%. The left ventricle has normal function. The left ventricle has no regional wall motion abnormalities. There is mild concentric left ventricular hypertrophy. Left ventricular diastolic parameters were normal. 2. Right ventricular systolic function is normal. The right ventricular size is mildly enlarged. There is normal pulmonary artery systolic pressure. 3. The mitral valve is normal in structure. Mild mitral valve regurgitation. No evidence of mitral stenosis. 4. The aortic valve is tricuspid. Aortic valve regurgitation is not visualized. No aortic stenosis is present.  5. Aortic Normal DTA. There is moderate dilatation of the ascending aorta and of the aortic root, measuring 40 mm. 6. The inferior vena cava is normal in size with greater than 50% respiratory variability, suggesting right atrial pressure of 3 mmHg.  FINDINGS Left Ventricle: Left ventricular ejection fraction, by estimation, is 55 to 60%. The left ventricle has normal function. The left ventricle has no regional wall motion abnormalities. Global longitudinal strain performed but not reported based on interpreter judgement due to suboptimal tracking. The left ventricular internal cavity size was normal in size. There is mild concentric left ventricular hypertrophy. Left ventricular diastolic parameters were normal. Indeterminate filling pressures.  Right Ventricle: The right ventricular size is mildly enlarged. No increase in right ventricular wall thickness. Right ventricular  systolic function is normal. There is normal pulmonary artery systolic pressure. The tricuspid regurgitant velocity is 2.35 m/s, and with an assumed right atrial pressure of 8 mmHg, the estimated right ventricular systolic pressure is 30.1 mmHg.  Left Atrium: Left atrial size was normal in size.  Right Atrium: Right atrial size was normal in size.  Pericardium: There is no evidence of pericardial effusion.  Mitral Valve: The mitral valve is normal in structure. Mild mitral valve regurgitation. No evidence of mitral valve stenosis.  Tricuspid Valve: The tricuspid valve is normal in structure. Tricuspid valve regurgitation is mild . No evidence of tricuspid stenosis.  Aortic Valve: The aortic valve is tricuspid. Aortic valve regurgitation is not visualized. No aortic stenosis is present.  Pulmonic Valve: The pulmonic valve was normal in structure. Pulmonic valve regurgitation is not visualized. No evidence of pulmonic stenosis.  Aorta: The aortic arch was not well visualized, the aortic root is normal in size and structure and Normal DTA. There is moderate dilatation of the ascending aorta and of the aortic root, measuring 40 mm.  Venous: A normal flow pattern is recorded from the right upper pulmonary vein. The inferior vena cava is normal in size with greater than 50% respiratory variability, suggesting right atrial pressure of 3 mmHg.  IAS/Shunts: No atrial level shunt detected by color flow Doppler.   LEFT VENTRICLE PLAX 2D LVIDd:         4.70 cm   Diastology LVIDs:         3.40 cm   LV e' medial:   6.09 cm/s LV PW:         1.20 cm   LV E/e' medial: 16.1 LV IVS:        1.20 cm LVOT diam:     2.30 cm LV SV:         97 LV SV Index:   42 LVOT Area:     4.15 cm   RIGHT VENTRICLE            IVC RV Basal diam:  4.50 cm    IVC diam: 2.30 cm RV Mid diam:    4.20 cm RV S prime:     8.49 cm/s TAPSE (M-mode): 2.0 cm  LEFT ATRIUM             Index        RIGHT ATRIUM            Index LA diam:        4.70 cm 2.03 cm/m   RA Area:     16.40 cm LA Vol (A2C):   58.5 ml 25.30 ml/m  RA Volume:   38.70 ml  16.74 ml/m LA Vol (A4C):   60.0 ml  25.95 ml/m LA Biplane Vol: 60.8 ml 26.30 ml/m AORTIC VALVE             PULMONIC VALVE LVOT Vmax:   99.27 cm/s  PR End Diast Vel: 6.15 msec LVOT Vmean:  66.800 cm/s LVOT VTI:    0.234 m  AORTA Ao Root diam: 4.20 cm Ao Asc diam:  3.90 cm Ao Desc diam: 2.10 cm  MITRAL VALVE               TRICUSPID VALVE MV Area (PHT): 3.51 cm    TR Peak grad:   22.1 mmHg MV Decel Time: 216 msec    TR Vmax:        235.00 cm/s MV E velocity: 98.17 cm/s MV A velocity: 85.33 cm/s  SHUNTS MV E/A ratio:  1.15        Systemic VTI:  0.23 m Systemic Diam: 2.30 cm  Norman Herrlich MD Electronically signed by Norman Herrlich MD Signature Date/Time: 10/05/2022/11:58:13 AM    Final  TEE  ECHO INTRAOPERATIVE TEE 10/11/2020  Narrative *INTRAOPERATIVE TRANSESOPHAGEAL REPORT *    Patient Name:   Phillip Terry     Date of Exam: 10/11/2020 Medical Rec #:  [id number]      Height:       72.0 in Accession #:    [id number]     Weight:       240.7 lb Date of Birth:  02/28/1945      BSA:          2.31 m Patient Age:    75 years       BP:           160/70 mmHg Patient Gender: M              HR:           70 bpm. Exam Location:  Anesthesiology  Transesophogeal exam was perform intraoperatively during surgical procedure. Patient was closely monitored under general anesthesia during the entirety of examination.  Indications:     I25.110 Atherosclerotic heart disease of native coronary artery with unstable angina pectoris Sonographer:     Lavenia Atlas RDCS Performing Phys: Leslye Peer MD Diagnosing Phys: Leslye Peer MD  Complications: No known complications during this procedure. POST-OP IMPRESSIONS _ Left Ventricle: The left ventricle is unchanged from pre-bypass. _ Right Ventricle: The right ventricle appears unchanged from pre-bypass. _ Aorta:  The aorta appears unchanged from pre-bypass. _ Left Atrium: The left atrium appears unchanged from pre-bypass. _ Left Atrial Appendage: The left atrial appendage appears unchanged from pre-bypass. _ Aortic Valve: The aortic valve appears unchanged from pre-bypass. _ Mitral Valve: The mitral valve appears unchanged from pre-bypass. _ Tricuspid Valve: The tricuspid valve appears unchanged from pre-bypass. _ Pulmonic Valve: The pulmonic valve appears unchanged from pre-bypass. _ Interatrial Septum: The interatrial septum appears unchanged from pre-bypass. _ Interventricular Septum: The interventricular septum appears unchanged from pre-bypass. _ Pericardium: The pericardium appears unchanged from pre-bypass.  PRE-OP FINDINGS Left Ventricle: The left ventricle has normal systolic function, with an ejection fraction of 60-65%. The cavity size was normal. There is no left ventricular hypertrophy.   Right Ventricle: The right ventricle has normal systolic function. The cavity was normal. There is no increase in right ventricular wall thickness.  Left Atrium: Left atrial size was normal in size. No left atrial/left atrial appendage thrombus was detected. There is continuous echo contrast seen in the left atrial cavity.  Right Atrium: Right atrial size was normal in size. Prominent  Chiari network.  Interatrial Septum: No atrial level shunt detected by color flow Doppler.  Pericardium: There is no evidence of pericardial effusion.  Mitral Valve: The mitral valve is normal in structure. Mitral valve regurgitation is trivial by color flow Doppler. There is No evidence of mitral stenosis.  Tricuspid Valve: The tricuspid valve was normal in structure. Tricuspid valve regurgitation is trivial by color flow Doppler. No evidence of tricuspid stenosis is present.  Aortic Valve: The aortic valve is tricuspid Aortic valve regurgitation was not visualized by color flow Doppler. There is no stenosis of the  aortic valve.   Pulmonic Valve: The pulmonic valve was not assessed. Pulmonic valve regurgitation was not assessed by color flow Doppler.    Leslye Peer MD Electronically signed by Leslye Peer MD Signature Date/Time: 10/11/2020/5:15:53 PM    Final  MONITORS  LONG TERM MONITOR (3-14 DAYS) 12/06/2020  Narrative Patch Wear Time:  3 days and 0 hours (2022-10-14T19:56:03-398 to 2022-10-17T19:58:35-0400)  Patient had a min HR of 47 bpm, max HR of 115 bpm, and avg HR of 62 bpm. Predominant underlying rhythm was Sinus Rhythm. 2 Supraventricular Tachycardia runs occurred, the run with the fastest interval lasting 4 beats with a max rate of 115 bpm, the longest lasting 5 beats with an avg rate of 100 bpm. Isolated SVEs were rare (<1.0%), SVE Couplets were rare (<1.0%), and SVE Triplets were rare (<1.0%). Isolated VEs were rare (<1.0%), VE Couplets were rare (<1.0%), and no VE Triplets were present.  Ventricular ectopy was rare Supraventricular ectopy is rare no episodes of atrial fibrillation or flutter.  There were 2 brief episodes of atrial tachycardia longest 5 beats and duration heart rate of 100 bpm.  There were no triggered or diary events  Conclusion unremarkable 3-day event monitor  CT SCANS  CT CORONARY MORPH W/CTA COR W/SCORE 09/13/2020  Addendum 09/13/2020  7:03 PM ADDENDUM REPORT: 09/13/2020 19:01  EXAM: OVER-READ INTERPRETATION  CT CHEST  The following report is an over-read performed by radiologist Dr. Lovie Chol [facility] on 09/13/2020. This over-read does not include interpretation of cardiac or coronary anatomy or pathology. The coronary CT a and coronary calcium interpretation by the cardiologist is attached. Chest with limited coverage through the heart including the mid chest only.  COMPARISON:  Chest CT from September 2021  FINDINGS: Cardiovascular: Please see dedicated report from cardiology for further detail regarding cardiac  findings.  Mediastinum/Nodes: No adenopathy in the visible mediastinum or hila. Esophagus grossly unremarkable with respect to visualized portions.  Lungs/Pleura: Mild subpleural reticulation bilaterally. Potential interstitial lung disease as outlined in prior CT imaging. No effusion. No consolidative changes. Airways, visible airways are patent.  Upper Abdomen: Incidental imaging of upper abdominal contents with signs of hepatic steatosis, moderate to marked. No acute upper abdominal process on very limited assessment.  Musculoskeletal: Spinal degenerative changes. No acute or destructive bone process.  IMPRESSION: 1. Suspicion for interstitial lung disease as outlined in prior CT imaging. 2. Hepatic steatosis.   Electronically Signed By: Donzetta Kohut M.D. On: 09/13/2020 19:01  Narrative CLINICAL DATA:  78 year old with angina.  EXAM: Cardiac/Coronary  CTA  TECHNIQUE: The patient was scanned on a [employer].  FINDINGS: A 130 kV prospective scan was triggered in the descending thoracic aorta at 111 HU's. Axial non-contrast 3 mm slices were carried out through the heart. The data set was analyzed on a dedicated work station and scored using the Agatson method. Gantry rotation speed was 250 msecs and  collimation was .6 mm. Beta blockade and 0.8 mg of sl NTG was given. The 3D data set was reconstructed in 5% intervals of the 67-82 % of the R-R cycle. Diastolic phases were analyzed on a dedicated work station using MPR, MIP and VRT modes. The patient received 80 cc of contrast.  Poor image quality: Misregistration artifact significant.  Aorta:  Normal size.  Aortic atherosclerosis.  Aortic Valve: No calcifications.  Coronary Arteries:  Normal coronary origin.  Right dominance.  RCA is a large dominant artery that gives rise to PDA and PLA. There is significant calcified plaque (Unable to interpret given severe misregistration).  Left main is a  large calcified artery that gives rise to LAD and LCX arteries. Unable to interpret given severe misregistration at the level of left main.  LAD - diffuse calcified plaque. Unable to interpret lumen given severe misregistration  LCX is a calcified vessel. Unable to interpret given severe misregistration.  Other findings:  Normal pulmonary vein drainage into the left atrium.  Normal left atrial appendage without a thrombus.  Normal size of the pulmonary artery.  Air from injection noted in apex of right ventricle.  Please see radiology report for non cardiac findings.  IMPRESSION: 1. Coronary calcium score of 2605. This was 17 percentile for age and sex matched control.  2. Normal coronary origin with right dominance.  3. Severe multivessel coronary plaque. Unable to interpret lumen given severe misregistration. Recommend cardiac catheterization for further clarification.  Electronically Signed: By: Donato Schultz MD On: 09/13/2020 11:38              Recent Labs: 11/23/2022: ALT 29; BUN 15; Creatinine, Ser 1.18; Potassium 4.6; Sodium 136  Recent Lipid Panel    Component Value Date/Time   CHOL 140 11/23/2022 1037   TRIG 231 (H) 11/23/2022 1037   HDL 28 (L) 11/23/2022 1037   CHOLHDL 5.0 11/23/2022 1037   CHOLHDL 2.6 10/09/2020 0325   VLDL 23 10/09/2020 0325   LDLCALC 74 11/23/2022 1037    Physical Exam:    VS:  There were no vitals taken for this visit.    Wt Readings from Last 3 Encounters:  09/11/22 242 lb 6.4 oz (110 kg)  12/14/21 249 lb 9.6 oz (113.2 kg)  05/05/21 249 lb 6.4 oz (113.1 kg)     GEN: *** Well nourished, well developed in no acute distress HEENT: Normal NECK: No JVD; No carotid bruits LYMPHATICS: No lymphadenopathy CARDIAC: ***RRR, no murmurs, rubs, gallops RESPIRATORY:  Clear to auscultation without rales, wheezing or rhonchi  ABDOMEN: Soft, non-tender, non-distended MUSCULOSKELETAL:  No edema; No deformity  SKIN: Warm and  dry NEUROLOGIC:  Alert and oriented x 3 PSYCHIATRIC:  Normal affect    Signed, Norman Herrlich, MD  03/13/2023 2:24 PM    [facility]

## 2023-03-14 ENCOUNTER — Ambulatory Visit: Payer: Medicare PPO | Admitting: Cardiology

## 2023-03-14 DIAGNOSIS — I7789 Other specified disorders of arteries and arterioles: Secondary | ICD-10-CM

## 2023-03-14 DIAGNOSIS — J8410 Pulmonary fibrosis, unspecified: Secondary | ICD-10-CM

## 2023-03-14 DIAGNOSIS — I25118 Atherosclerotic heart disease of native coronary artery with other forms of angina pectoris: Secondary | ICD-10-CM

## 2023-03-14 DIAGNOSIS — E782 Mixed hyperlipidemia: Secondary | ICD-10-CM

## 2023-03-14 DIAGNOSIS — Z951 Presence of aortocoronary bypass graft: Secondary | ICD-10-CM

## 2023-03-14 DIAGNOSIS — I10 Essential (primary) hypertension: Secondary | ICD-10-CM

## 2023-03-20 NOTE — Progress Notes (Deleted)
 Cardiology Office Note:  .   Date:  03/20/2023  ID:  Phillip Terry, DOB 12-Feb-1946, MRN [id number] PCP: Keren Vicenta BRAVO, MD  [facility] Providers Cardiologist:  Redell Leiter, MD { Click to update primary MD,subspecialty MD or APP then REFRESH:1}   History of Present Illness: .   Phillip Terry is a 78 y.o. male with a past medical history of CAD s/p CABG x 3 2022, PAF--postoperatively following his CABG with no recurrent episodes, hypertension, OSA on CPAP, pulmonary fibrosis, gout, dyslipidemia, obesity.  11/12/2022 CT of the chest ectasia of the ascending aorta 4.2 cm in diameter recommendation for annual repeat imaging 10/05/2022 echo EF 55 to 60%, mild concentric LVH, mild MR, moderate dilatation of the ascending aortic root 40 mm >> CT of chest 12/08/2020 monitor average heart rate 62 bpm, predominant rhythm was sinus, 2 episodes of SVT, no A-fib 10/11/2020 CABG x 3 LIMA to LAD, reverse saphenous vein to PDA, reverse saphenous vein to diagonal branch 10/10/2020 carotid duplex mild stenosis in the left ICA, near normal in the right ICA  Most recently evaluated by Dr. Leiter on 09/11/2022, he was stable regarding his CAD.  He did have some shortness of breath so an echocardiogram was arranged--although this was felt to be related to his pulmonary fibrosis and LV dysfunction, echocardiogram was unrevealing for contributory causes of shortness of breath however did reveal dilatation of his ascending aorta.  CTA of the chest was arranged which revealed 4.2 cm dilatation with recommendation for repeat imaging yearly.   ROS: ROS   Studies Reviewed: .        Cardiac Studies & Procedures   CARDIAC CATHETERIZATION  CARDIAC CATHETERIZATION 09/23/2020  Narrative   Prox RCA to Mid RCA lesion is 60-70% stenosed.   Dist Cx lesion is 50% stenosed.   Mid LAD lesion is 75% stenosed.  This is a long segment of disease starting immediately after the large diagonal branch.   Prox LAD lesion is 90%  stenosed.   2nd Diag lesion is 80% stenosed.  This is a small vessel which likely could be treated medically.   RPAV lesion is 50% stenosed.   The left ventricular systolic function is normal.   LV end diastolic pressure is normal.   The left ventricular ejection fraction is 55-65% by visual estimate.   There is no aortic valve stenosis.  Severe, calcific disease most notably in the proximal and mid LAD.  The long segment of disease in the mid LAD involves several diagonal vessel ostia.  Moderate disease in the RCA.  We will plan for cardiac surgery referral for consideration of robotic mid CAB procedure with LIMA to LAD.  I suspect his symptoms would resolve with just that revascularization.  If symptoms persisted, the RCA is certainly treatable percutaneously and a hybrid approach to his revascularization could be considered.  Severe tortuosity of the right subclavian.  If intervention was needed, would consider using long destination sheath from the right radial to engage the RCA.  Engaging the left main was much easier.  Results discussed with the patient's daughter.  Echocardiogram will be obtained.  Referral made to Dr. Shyrl.  Findings Coronary Findings Diagnostic  Dominance: Right  Left Anterior Descending The vessel exhibits minimal luminal irregularities. Prox LAD lesion is 90% stenosed. The lesion is severely calcified. Mid LAD lesion is 75% stenosed. The lesion is severely calcified.  Second Diagonal Branch Vessel is small in size. 2nd Diag lesion is 80% stenosed.  Left Circumflex Dist  Cx lesion is 50% stenosed.  Right Coronary Artery Prox RCA to Mid RCA lesion is 60% stenosed.  Right Posterior Atrioventricular Artery RPAV lesion is 50% stenosed.  Intervention  No interventions have been documented.   STRESS TESTS  MYOCARDIAL PERFUSION IMAGING 08/26/2019  ECHOCARDIOGRAM  ECHOCARDIOGRAM COMPLETE 10/05/2022  Narrative ECHOCARDIOGRAM REPORT    Patient  Name:   Phillip Terry Date of Exam: 10/05/2022 Medical Rec #:  [id number]  Height:       72.0 in Accession #:    [id number] Weight:       242.4 lb Date of Birth:  [birth year]  BSA:          2.312 m Patient Age:    77 years   BP:           132/66 mmHg Patient Gender: M          HR:           62 bpm. Exam Location:  [place]  Procedure: 2D Echo, Cardiac Doppler, Color Doppler and Strain Analysis  Indications:    Coronary artery disease of native artery of native heart with stable angina pectoris (HCC) [I25.118 (ICD-10-CM)]; S/P CABG x 3 [Z95.1 (ICD-10-CM)]; Essential hypertension [I10 (ICD-10-CM)]; Mixed hyperlipidemia [E78.2 (ICD-10-CM)]; PAF (paroxysmal atrial fibrillation) (HCC) [I48.0 (ICD-10-CM)]; Pulmonary fibrosis (HCC) [J84.10 (ICD-10-CM)]  History:        Patient has prior history of Echocardiogram examinations, most recent 10/07/2020. Prior CABG, Arrythmias:Atrial Fibrillation; Risk Factors:Hypertension and Dyslipidemia.  Sonographer:    Charlie Jointer RDCS Referring Phys: [id number] BRIAN J MUNLEY  IMPRESSIONS   1. Left ventricular ejection fraction, by estimation, is 55 to 60%. The left ventricle has normal function. The left ventricle has no regional wall motion abnormalities. There is mild concentric left ventricular hypertrophy. Left ventricular diastolic parameters were normal. 2. Right ventricular systolic function is normal. The right ventricular size is mildly enlarged. There is normal pulmonary artery systolic pressure. 3. The mitral valve is normal in structure. Mild mitral valve regurgitation. No evidence of mitral stenosis. 4. The aortic valve is tricuspid. Aortic valve regurgitation is not visualized. No aortic stenosis is present. 5. Aortic Normal DTA. There is moderate dilatation of the ascending aorta and of the aortic root, measuring 40 mm. 6. The inferior vena cava is normal in size with greater than 50% respiratory variability, suggesting right atrial pressure of 3  mmHg.  FINDINGS Left Ventricle: Left ventricular ejection fraction, by estimation, is 55 to 60%. The left ventricle has normal function. The left ventricle has no regional wall motion abnormalities. Global longitudinal strain performed but not reported based on interpreter judgement due to suboptimal tracking. The left ventricular internal cavity size was normal in size. There is mild concentric left ventricular hypertrophy. Left ventricular diastolic parameters were normal. Indeterminate filling pressures.  Right Ventricle: The right ventricular size is mildly enlarged. No increase in right ventricular wall thickness. Right ventricular systolic function is normal. There is normal pulmonary artery systolic pressure. The tricuspid regurgitant velocity is 2.35 m/s, and with an assumed right atrial pressure of 8 mmHg, the estimated right ventricular systolic pressure is 30.1 mmHg.  Left Atrium: Left atrial size was normal in size.  Right Atrium: Right atrial size was normal in size.  Pericardium: There is no evidence of pericardial effusion.  Mitral Valve: The mitral valve is normal in structure. Mild mitral valve regurgitation. No evidence of mitral valve stenosis.  Tricuspid Valve: The tricuspid valve is normal in structure. Tricuspid valve regurgitation is mild . No  evidence of tricuspid stenosis.  Aortic Valve: The aortic valve is tricuspid. Aortic valve regurgitation is not visualized. No aortic stenosis is present.  Pulmonic Valve: The pulmonic valve was normal in structure. Pulmonic valve regurgitation is not visualized. No evidence of pulmonic stenosis.  Aorta: The aortic arch was not well visualized, the aortic root is normal in size and structure and Normal DTA. There is moderate dilatation of the ascending aorta and of the aortic root, measuring 40 mm.  Venous: A normal flow pattern is recorded from the right upper pulmonary vein. The inferior vena cava is normal in size with greater  than 50% respiratory variability, suggesting right atrial pressure of 3 mmHg.  IAS/Shunts: No atrial level shunt detected by color flow Doppler.   LEFT VENTRICLE PLAX 2D LVIDd:         4.70 cm   Diastology LVIDs:         3.40 cm   LV e' medial:   6.09 cm/s LV PW:         1.20 cm   LV E/e' medial: 16.1 LV IVS:        1.20 cm LVOT diam:     2.30 cm LV SV:         97 LV SV Index:   42 LVOT Area:     4.15 cm   RIGHT VENTRICLE            IVC RV Basal diam:  4.50 cm    IVC diam: 2.30 cm RV Mid diam:    4.20 cm RV S prime:     8.49 cm/s TAPSE (M-mode): 2.0 cm  LEFT ATRIUM             Index        RIGHT ATRIUM           Index LA diam:        4.70 cm 2.03 cm/m   RA Area:     16.40 cm LA Vol (A2C):   58.5 ml 25.30 ml/m  RA Volume:   38.70 ml  16.74 ml/m LA Vol (A4C):   60.0 ml 25.95 ml/m LA Biplane Vol: 60.8 ml 26.30 ml/m AORTIC VALVE             PULMONIC VALVE LVOT Vmax:   99.27 cm/s  PR End Diast Vel: 6.15 msec LVOT Vmean:  66.800 cm/s LVOT VTI:    0.234 m  AORTA Ao Root diam: 4.20 cm Ao Asc diam:  3.90 cm Ao Desc diam: 2.10 cm  MITRAL VALVE               TRICUSPID VALVE MV Area (PHT): 3.51 cm    TR Peak grad:   22.1 mmHg MV Decel Time: 216 msec    TR Vmax:        235.00 cm/s MV E velocity: 98.17 cm/s MV A velocity: 85.33 cm/s  SHUNTS MV E/A ratio:  1.15        Systemic VTI:  0.23 m Systemic Diam: 2.30 cm  Redell Leiter MD Electronically signed by Redell Leiter MD Signature Date/Time: 10/05/2022/11:58:13 AM    Final  TEE  ECHO INTRAOPERATIVE TEE 10/11/2020  Narrative *INTRAOPERATIVE TRANSESOPHAGEAL REPORT *    Patient Name:   JARIUS DIEUDONNE     Date of Exam: 10/11/2020 Medical Rec #:  [id number]      Height:       72.0 in Accession #:    [id number]     Weight:  240.7 lb Date of Birth:  1945/04/09      BSA:          2.31 m Patient Age:    75 years       BP:           160/70 mmHg Patient Gender: M              HR:           70 bpm. Exam Location:   Anesthesiology  Transesophogeal exam was perform intraoperatively during surgical procedure. Patient was closely monitored under general anesthesia during the entirety of examination.  Indications:     I25.110 Atherosclerotic heart disease of native coronary artery with unstable angina pectoris Sonographer:     Lyle Marc RDCS Performing Phys: Debby Like MD Diagnosing Phys: Debby Like MD  Complications: No known complications during this procedure. POST-OP IMPRESSIONS _ Left Ventricle: The left ventricle is unchanged from pre-bypass. _ Right Ventricle: The right ventricle appears unchanged from pre-bypass. _ Aorta: The aorta appears unchanged from pre-bypass. _ Left Atrium: The left atrium appears unchanged from pre-bypass. _ Left Atrial Appendage: The left atrial appendage appears unchanged from pre-bypass. _ Aortic Valve: The aortic valve appears unchanged from pre-bypass. _ Mitral Valve: The mitral valve appears unchanged from pre-bypass. _ Tricuspid Valve: The tricuspid valve appears unchanged from pre-bypass. _ Pulmonic Valve: The pulmonic valve appears unchanged from pre-bypass. _ Interatrial Septum: The interatrial septum appears unchanged from pre-bypass. _ Interventricular Septum: The interventricular septum appears unchanged from pre-bypass. _ Pericardium: The pericardium appears unchanged from pre-bypass.  PRE-OP FINDINGS Left Ventricle: The left ventricle has normal systolic function, with an ejection fraction of 60-65%. The cavity size was normal. There is no left ventricular hypertrophy.   Right Ventricle: The right ventricle has normal systolic function. The cavity was normal. There is no increase in right ventricular wall thickness.  Left Atrium: Left atrial size was normal in size. No left atrial/left atrial appendage thrombus was detected. There is continuous echo contrast seen in the left atrial cavity.  Right Atrium: Right atrial size was normal in  size. Prominent Chiari network.  Interatrial Septum: No atrial level shunt detected by color flow Doppler.  Pericardium: There is no evidence of pericardial effusion.  Mitral Valve: The mitral valve is normal in structure. Mitral valve regurgitation is trivial by color flow Doppler. There is No evidence of mitral stenosis.  Tricuspid Valve: The tricuspid valve was normal in structure. Tricuspid valve regurgitation is trivial by color flow Doppler. No evidence of tricuspid stenosis is present.  Aortic Valve: The aortic valve is tricuspid Aortic valve regurgitation was not visualized by color flow Doppler. There is no stenosis of the aortic valve.   Pulmonic Valve: The pulmonic valve was not assessed. Pulmonic valve regurgitation was not assessed by color flow Doppler.    Debby Like MD Electronically signed by Debby Like MD Signature Date/Time: 10/11/2020/5:15:53 PM    Final  MONITORS  LONG TERM MONITOR (3-14 DAYS) 12/06/2020  Narrative Patch Wear Time:  3 days and 0 hours (2022-10-14T19:56:03-398 to 2022-10-17T19:58:35-0400)  Patient had a min HR of 47 bpm, max HR of 115 bpm, and avg HR of 62 bpm. Predominant underlying rhythm was Sinus Rhythm. 2 Supraventricular Tachycardia runs occurred, the run with the fastest interval lasting 4 beats with a max rate of 115 bpm, the longest lasting 5 beats with an avg rate of 100 bpm. Isolated SVEs were rare (<1.0%), SVE Couplets were rare (<1.0%), and SVE Triplets were  rare (<1.0%). Isolated VEs were rare (<1.0%), VE Couplets were rare (<1.0%), and no VE Triplets were present.  Ventricular ectopy was rare Supraventricular ectopy is rare no episodes of atrial fibrillation or flutter.  There were 2 brief episodes of atrial tachycardia longest 5 beats and duration heart rate of 100 bpm.  There were no triggered or diary events  Conclusion unremarkable 3-day event monitor  CT SCANS  CT CORONARY MORPH W/CTA COR W/SCORE  09/13/2020  Addendum 09/13/2020  7:03 PM ADDENDUM REPORT: 09/13/2020 19:01  EXAM: OVER-READ INTERPRETATION  CT CHEST  The following report is an over-read performed by radiologist Dr. Isla Qua [facility] on 09/13/2020. This over-read does not include interpretation of cardiac or coronary anatomy or pathology. The coronary CT a and coronary calcium  interpretation by the cardiologist is attached. Chest with limited coverage through the heart including the mid chest only.  COMPARISON:  Chest CT from September 2021  FINDINGS: Cardiovascular: Please see dedicated report from cardiology for further detail regarding cardiac findings.  Mediastinum/Nodes: No adenopathy in the visible mediastinum or hila. Esophagus grossly unremarkable with respect to visualized portions.  Lungs/Pleura: Mild subpleural reticulation bilaterally. Potential interstitial lung disease as outlined in prior CT imaging. No effusion. No consolidative changes. Airways, visible airways are patent.  Upper Abdomen: Incidental imaging of upper abdominal contents with signs of hepatic steatosis, moderate to marked. No acute upper abdominal process on very limited assessment.  Musculoskeletal: Spinal degenerative changes. No acute or destructive bone process.  IMPRESSION: 1. Suspicion for interstitial lung disease as outlined in prior CT imaging. 2. Hepatic steatosis.   Electronically Signed By: Isla Blind M.D. On: 09/13/2020 19:01  Narrative CLINICAL DATA:  78 year old with angina.  EXAM: Cardiac/Coronary  CTA  TECHNIQUE: The patient was scanned on a [employer].  FINDINGS: A 130 kV prospective scan was triggered in the descending thoracic aorta at 111 HU's. Axial non-contrast 3 mm slices were carried out through the heart. The data set was analyzed on a dedicated work station and scored using the Agatson method. Gantry rotation speed was 250 msecs and collimation was  .6 mm. Beta blockade and 0.8 mg of sl NTG was given. The 3D data set was reconstructed in 5% intervals of the 67-82 % of the R-R cycle. Diastolic phases were analyzed on a dedicated work station using MPR, MIP and VRT modes. The patient received 80 cc of contrast.  Poor image quality: Misregistration artifact significant.  Aorta:  Normal size.  Aortic atherosclerosis.  Aortic Valve: No calcifications.  Coronary Arteries:  Normal coronary origin.  Right dominance.  RCA is a large dominant artery that gives rise to PDA and PLA. There is significant calcified plaque (Unable to interpret given severe misregistration).  Left main is a large calcified artery that gives rise to LAD and LCX arteries. Unable to interpret given severe misregistration at the level of left main.  LAD - diffuse calcified plaque. Unable to interpret lumen given severe misregistration  LCX is a calcified vessel. Unable to interpret given severe misregistration.  Other findings:  Normal pulmonary vein drainage into the left atrium.  Normal left atrial appendage without a thrombus.  Normal size of the pulmonary artery.  Air from injection noted in apex of right ventricle.  Please see radiology report for non cardiac findings.  IMPRESSION: 1. Coronary calcium  score of 2605. This was 73 percentile for age and sex matched control.  2. Normal coronary origin with right dominance.  3. Severe multivessel  coronary plaque. Unable to interpret lumen given severe misregistration. Recommend cardiac catheterization for further clarification.  Electronically Signed: By: Oneil Parchment MD On: 09/13/2020 11:38          Risk Assessment/Calculations:    CHA2DS2-VASc Score =    {Click here to calculate score.  REFRESH note before signing. :1} This indicates a  % annual risk of stroke. The patient's score is based upon:      No BP recorded.  {Refresh Note OR Click here to enter BP  :1}***       Physical  Exam:   VS:  There were no vitals taken for this visit.   Wt Readings from Last 3 Encounters:  09/11/22 242 lb 6.4 oz (110 kg)  12/14/21 249 lb 9.6 oz (113.2 kg)  05/05/21 249 lb 6.4 oz (113.1 kg)    GEN: Well nourished, well developed in no acute distress NECK: No JVD; No carotid bruits CARDIAC: ***RRR, no murmurs, rubs, gallops RESPIRATORY:  Clear to auscultation without rales, wheezing or rhonchi  ABDOMEN: Soft, non-tender, non-distended EXTREMITIES:  No edema; No deformity   ASSESSMENT AND PLAN: .   CAD-s/p CABG x 3 in 2022     {Are you ordering a CV Procedure (e.g. stress test, cath, DCCV, TEE, etc)?   Press F2        :[id number]}  Dispo: ***  Signed, Delon JAYSON Hoover, NP

## 2023-03-21 ENCOUNTER — Ambulatory Visit: Payer: Medicare PPO | Admitting: Cardiology

## 2023-03-21 DIAGNOSIS — Z951 Presence of aortocoronary bypass graft: Secondary | ICD-10-CM

## 2023-03-21 DIAGNOSIS — I10 Essential (primary) hypertension: Secondary | ICD-10-CM

## 2023-03-21 DIAGNOSIS — I25118 Atherosclerotic heart disease of native coronary artery with other forms of angina pectoris: Secondary | ICD-10-CM

## 2023-03-21 DIAGNOSIS — I719 Aortic aneurysm of unspecified site, without rupture: Secondary | ICD-10-CM

## 2023-03-21 DIAGNOSIS — I480 Paroxysmal atrial fibrillation: Secondary | ICD-10-CM

## 2023-03-24 NOTE — Progress Notes (Signed)
 Cardiology Office Note:  .   Date:  03/25/2023  ID:  Yolanda Hence, DOB 18-Mar-1945, MRN [id number] PCP: Adrian Hopper, MD  [facility] Providers Cardiologist:  Zoe Hinds, MD    History of Present Illness: .   Davaughn Mallak is a 78 y.o. male with a past medical history of CAD s/p CABG x 3 2022, AAA without rupture, PAF--postoperatively following his CABG with no recurrent episodes, hypertension, OSA on CPAP, pulmonary fibrosis, gout, dyslipidemia, obesity.  11/12/2022 CT of the chest ectasia of the ascending aorta 4.2 cm in diameter recommendation for annual repeat imaging 10/05/2022 echo EF 55 to 60%, mild concentric LVH, mild MR, moderate dilatation of the ascending aortic root 40 mm >> CT of chest 12/08/2020 monitor average heart rate 62 bpm, predominant rhythm was sinus, 2 episodes of SVT, no A-fib 10/11/2020 CABG x 3 LIMA to LAD, reverse saphenous vein to PDA, reverse saphenous vein to diagonal brancbefore the nurse brought h 10/10/2020 carotid duplex mild stenosis in the left ICA, near normal in the right ICA  Most recently evaluated by Dr. Sandee Crook on 09/11/2022, he was stable regarding his CAD.  He did have some shortness of breath so an echocardiogram was arranged--although this was felt to be related to his pulmonary fibrosis and LV dysfunction, echocardiogram was unrevealing for contributory causes of shortness of breath however did reveal dilatation of his ascending aorta.  CTA of the chest was arranged which revealed 4.2 cm dilatation with recommendation for repeat imaging yearly.  He presents today for follow-up of his CAD.  There appears to be some confusion over his medication list, apparently he thought his Exforge had been discontinued so he has not been taking this for his blood pressure.  He does not have any formal complaints from a cardiac perspective. He denies chest pain, palpitations, dyspnea, pnd, orthopnea, n, v, dizziness, syncope, edema, weight gain, or early satiety.    ROS: Review of Systems  All other systems reviewed and are negative.    Studies Reviewed: Aaron Aas   EKG Interpretation Date/Time:  Monday March 25 2023 15:07:43 EST Ventricular Rate:  58 PR Interval:  182 QRS Duration:  92 QT Interval:  434 QTC Calculation: 426 R Axis:   17  Text Interpretation: Sinus bradycardia with Premature supraventricular complexes When compared with ECG of 12-Oct-2020 22:00, Sinus rhythm has replaced Atrial fibrillation Vent. rate has decreased BY  61 BPM T wave inversion now evident in Anterior leads T wave inversion no longer evident in Lateral leads Confirmed by Pattricia Bores [phone]) on 03/25/2023 3:12:19 PM    Cardiac Studies & Procedures   CARDIAC CATHETERIZATION  CARDIAC CATHETERIZATION 09/23/2020  Narrative   Prox RCA to Mid RCA lesion is 60-70% stenosed.   Dist Cx lesion is 50% stenosed.   Mid LAD lesion is 75% stenosed.  This is a long segment of disease starting immediately after the large diagonal branch.   Prox LAD lesion is 90% stenosed.   2nd Diag lesion is 80% stenosed.  This is a small vessel which likely could be treated medically.   RPAV lesion is 50% stenosed.   The left ventricular systolic function is normal.   LV end diastolic pressure is normal.   The left ventricular ejection fraction is 55-65% by visual estimate.   There is no aortic valve stenosis.  Severe, calcific disease most notably in the proximal and mid LAD.  The long segment of disease in the mid LAD involves several diagonal vessel ostia.  Moderate  disease in the RCA.  We will plan for cardiac surgery referral for consideration of robotic mid CAB procedure with LIMA to LAD.  I suspect his symptoms would resolve with just that revascularization.  If symptoms persisted, the RCA is certainly treatable percutaneously and a hybrid approach to his revascularization could be considered.  Severe tortuosity of the right subclavian.  If intervention was needed, would consider  using long destination sheath from the right radial to engage the RCA.  Engaging the left main was much easier.  Results discussed with the patient's daughter.  Echocardiogram will be obtained.  Referral made to Dr. Deloise Ferries.  Findings Coronary Findings Diagnostic  Dominance: Right  Left Anterior Descending The vessel exhibits minimal luminal irregularities. Prox LAD lesion is 90% stenosed. The lesion is severely calcified. Mid LAD lesion is 75% stenosed. The lesion is severely calcified.  Second Diagonal Branch Vessel is small in size. 2nd Diag lesion is 80% stenosed.  Left Circumflex Dist Cx lesion is 50% stenosed.  Right Coronary Artery Prox RCA to Mid RCA lesion is 60% stenosed.  Right Posterior Atrioventricular Artery RPAV lesion is 50% stenosed.  Intervention  No interventions have been documented.   STRESS TESTS  MYOCARDIAL PERFUSION IMAGING 08/26/2019  ECHOCARDIOGRAM  ECHOCARDIOGRAM COMPLETE 10/05/2022  Narrative ECHOCARDIOGRAM REPORT    Patient Name:   KAIA DUDERSTADT Date of Exam: 10/05/2022 Medical Rec #:  [id number]  Height:       72.0 in Accession #:    [id number] Weight:       242.4 lb Date of Birth:  1945/03/08  BSA:          2.312 m Patient Age:    77 years   BP:           132/66 mmHg Patient Gender: M          HR:           62 bpm. Exam Location:  [place]  Procedure: 2D Echo, Cardiac Doppler, Color Doppler and Strain Analysis  Indications:    Coronary artery disease of native artery of native heart with stable angina pectoris (HCC) [I25.118 (ICD-10-CM)]; S/P CABG x 3 [Z95.1 (ICD-10-CM)]; Essential hypertension [I10 (ICD-10-CM)]; Mixed hyperlipidemia [E78.2 (ICD-10-CM)]; PAF (paroxysmal atrial fibrillation) (HCC) [I48.0 (ICD-10-CM)]; Pulmonary fibrosis (HCC) [J84.10 (ICD-10-CM)]  History:        Patient has prior history of Echocardiogram examinations, most recent 10/07/2020. Prior CABG, Arrythmias:Atrial Fibrillation; Risk Factors:Hypertension and  Dyslipidemia.  Sonographer:    Erminia Hazel RDCS Referring Phys: [id number] BRIAN J MUNLEY  IMPRESSIONS   1. Left ventricular ejection fraction, by estimation, is 55 to 60%. The left ventricle has normal function. The left ventricle has no regional wall motion abnormalities. There is mild concentric left ventricular hypertrophy. Left ventricular diastolic parameters were normal. 2. Right ventricular systolic function is normal. The right ventricular size is mildly enlarged. There is normal pulmonary artery systolic pressure. 3. The mitral valve is normal in structure. Mild mitral valve regurgitation. No evidence of mitral stenosis. 4. The aortic valve is tricuspid. Aortic valve regurgitation is not visualized. No aortic stenosis is present. 5. Aortic Normal DTA. There is moderate dilatation of the ascending aorta and of the aortic root, measuring 40 mm. 6. The inferior vena cava is normal in size with greater than 50% respiratory variability, suggesting right atrial pressure of 3 mmHg.  FINDINGS Left Ventricle: Left ventricular ejection fraction, by estimation, is 55 to 60%. The left ventricle has normal function. The left ventricle has no regional wall  motion abnormalities. Global longitudinal strain performed but not reported based on interpreter judgement due to suboptimal tracking. The left ventricular internal cavity size was normal in size. There is mild concentric left ventricular hypertrophy. Left ventricular diastolic parameters were normal. Indeterminate filling pressures.  Right Ventricle: The right ventricular size is mildly enlarged. No increase in right ventricular wall thickness. Right ventricular systolic function is normal. There is normal pulmonary artery systolic pressure. The tricuspid regurgitant velocity is 2.35 m/s, and with an assumed right atrial pressure of 8 mmHg, the estimated right ventricular systolic pressure is 30.1 mmHg.  Left Atrium: Left atrial size was  normal in size.  Right Atrium: Right atrial size was normal in size.  Pericardium: There is no evidence of pericardial effusion.  Mitral Valve: The mitral valve is normal in structure. Mild mitral valve regurgitation. No evidence of mitral valve stenosis.  Tricuspid Valve: The tricuspid valve is normal in structure. Tricuspid valve regurgitation is mild . No evidence of tricuspid stenosis.  Aortic Valve: The aortic valve is tricuspid. Aortic valve regurgitation is not visualized. No aortic stenosis is present.  Pulmonic Valve: The pulmonic valve was normal in structure. Pulmonic valve regurgitation is not visualized. No evidence of pulmonic stenosis.  Aorta: The aortic arch was not well visualized, the aortic root is normal in size and structure and Normal DTA. There is moderate dilatation of the ascending aorta and of the aortic root, measuring 40 mm.  Venous: A normal flow pattern is recorded from the right upper pulmonary vein. The inferior vena cava is normal in size with greater than 50% respiratory variability, suggesting right atrial pressure of 3 mmHg.  IAS/Shunts: No atrial level shunt detected by color flow Doppler.   LEFT VENTRICLE PLAX 2D LVIDd:         4.70 cm   Diastology LVIDs:         3.40 cm   LV e' medial:   6.09 cm/s LV PW:         1.20 cm   LV E/e' medial: 16.1 LV IVS:        1.20 cm LVOT diam:     2.30 cm LV SV:         97 LV SV Index:   42 LVOT Area:     4.15 cm   RIGHT VENTRICLE            IVC RV Basal diam:  4.50 cm    IVC diam: 2.30 cm RV Mid diam:    4.20 cm RV S prime:     8.49 cm/s TAPSE (M-mode): 2.0 cm  LEFT ATRIUM             Index        RIGHT ATRIUM           Index LA diam:        4.70 cm 2.03 cm/m   RA Area:     16.40 cm LA Vol (A2C):   58.5 ml 25.30 ml/m  RA Volume:   38.70 ml  16.74 ml/m LA Vol (A4C):   60.0 ml 25.95 ml/m LA Biplane Vol: 60.8 ml 26.30 ml/m AORTIC VALVE             PULMONIC VALVE LVOT Vmax:   99.27 cm/s  PR End  Diast Vel: 6.15 msec LVOT Vmean:  66.800 cm/s LVOT VTI:    0.234 m  AORTA Ao Root diam: 4.20 cm Ao Asc diam:  3.90 cm Ao Desc diam: 2.10 cm  MITRAL VALVE               TRICUSPID VALVE MV Area (PHT): 3.51 cm    TR Peak grad:   22.1 mmHg MV Decel Time: 216 msec    TR Vmax:        235.00 cm/s MV E velocity: 98.17 cm/s MV A velocity: 85.33 cm/s  SHUNTS MV E/A ratio:  1.15        Systemic VTI:  0.23 m Systemic Diam: 2.30 cm  Zoe Hinds MD Electronically signed by Zoe Hinds MD Signature Date/Time: 10/05/2022/11:58:13 AM    Final  TEE  ECHO INTRAOPERATIVE TEE 10/11/2020  Narrative *INTRAOPERATIVE TRANSESOPHAGEAL REPORT *    Patient Name:   AXELL GOODINE     Date of Exam: 10/11/2020 Medical Rec #:  [id number]      Height:       72.0 in Accession #:    [id number]     Weight:       240.7 lb Date of Birth:  06/13/1945      BSA:          2.31 m Patient Age:    75 years       BP:           160/70 mmHg Patient Gender: M              HR:           70 bpm. Exam Location:  Anesthesiology  Transesophogeal exam was perform intraoperatively during surgical procedure. Patient was closely monitored under general anesthesia during the entirety of examination.  Indications:     I25.110 Atherosclerotic heart disease of native coronary artery with unstable angina pectoris Sonographer:     Cesario Collum RDCS Performing Phys: Hobart Lulas MD Diagnosing Phys: Hobart Lulas MD  Complications: No known complications during this procedure. POST-OP IMPRESSIONS _ Left Ventricle: The left ventricle is unchanged from pre-bypass. _ Right Ventricle: The right ventricle appears unchanged from pre-bypass. _ Aorta: The aorta appears unchanged from pre-bypass. _ Left Atrium: The left atrium appears unchanged from pre-bypass. _ Left Atrial Appendage: The left atrial appendage appears unchanged from pre-bypass. _ Aortic Valve: The aortic valve appears unchanged from pre-bypass. _ Mitral Valve: The  mitral valve appears unchanged from pre-bypass. _ Tricuspid Valve: The tricuspid valve appears unchanged from pre-bypass. _ Pulmonic Valve: The pulmonic valve appears unchanged from pre-bypass. _ Interatrial Septum: The interatrial septum appears unchanged from pre-bypass. _ Interventricular Septum: The interventricular septum appears unchanged from pre-bypass. _ Pericardium: The pericardium appears unchanged from pre-bypass.  PRE-OP FINDINGS Left Ventricle: The left ventricle has normal systolic function, with an ejection fraction of 60-65%. The cavity size was normal. There is no left ventricular hypertrophy.   Right Ventricle: The right ventricle has normal systolic function. The cavity was normal. There is no increase in right ventricular wall thickness.  Left Atrium: Left atrial size was normal in size. No left atrial/left atrial appendage thrombus was detected. There is continuous echo contrast seen in the left atrial cavity.  Right Atrium: Right atrial size was normal in size. Prominent Chiari network.  Interatrial Septum: No atrial level shunt detected by color flow Doppler.  Pericardium: There is no evidence of pericardial effusion.  Mitral Valve: The mitral valve is normal in structure. Mitral valve regurgitation is trivial by color flow Doppler. There is No evidence of mitral stenosis.  Tricuspid Valve: The tricuspid valve was normal in structure. Tricuspid valve regurgitation is trivial by color flow Doppler. No  evidence of tricuspid stenosis is present.  Aortic Valve: The aortic valve is tricuspid Aortic valve regurgitation was not visualized by color flow Doppler. There is no stenosis of the aortic valve.   Pulmonic Valve: The pulmonic valve was not assessed. Pulmonic valve regurgitation was not assessed by color flow Doppler.    Hobart Lulas MD Electronically signed by Hobart Lulas MD Signature Date/Time: 10/11/2020/5:15:53 PM    Final  MONITORS  LONG TERM  MONITOR (3-14 DAYS) 12/06/2020  Narrative Patch Wear Time:  3 days and 0 hours (2022-10-14T19:56:03-398 to 2022-10-17T19:58:35-0400)  Patient had a min HR of 47 bpm, max HR of 115 bpm, and avg HR of 62 bpm. Predominant underlying rhythm was Sinus Rhythm. 2 Supraventricular Tachycardia runs occurred, the run with the fastest interval lasting 4 beats with a max rate of 115 bpm, the longest lasting 5 beats with an avg rate of 100 bpm. Isolated SVEs were rare (<1.0%), SVE Couplets were rare (<1.0%), and SVE Triplets were rare (<1.0%). Isolated VEs were rare (<1.0%), VE Couplets were rare (<1.0%), and no VE Triplets were present.  Ventricular ectopy was rare Supraventricular ectopy is rare no episodes of atrial fibrillation or flutter.  There were 2 brief episodes of atrial tachycardia longest 5 beats and duration heart rate of 100 bpm.  There were no triggered or diary events  Conclusion unremarkable 3-day event monitor  CT SCANS  CT CORONARY MORPH W/CTA COR W/SCORE 09/13/2020  Addendum 09/13/2020  7:03 PM ADDENDUM REPORT: 09/13/2020 19:01  EXAM: OVER-READ INTERPRETATION  CT CHEST  The following report is an over-read performed by radiologist Dr. Irineo Manns [facility] on 09/13/2020. This over-read does not include interpretation of cardiac or coronary anatomy or pathology. The coronary CT a and coronary calcium  interpretation by the cardiologist is attached. Chest with limited coverage through the heart including the mid chest only.  COMPARISON:  Chest CT from September 2021  FINDINGS: Cardiovascular: Please see dedicated report from cardiology for further detail regarding cardiac findings.  Mediastinum/Nodes: No adenopathy in the visible mediastinum or hila. Esophagus grossly unremarkable with respect to visualized portions.  Lungs/Pleura: Mild subpleural reticulation bilaterally. Potential interstitial lung disease as outlined in prior CT imaging. No effusion. No  consolidative changes. Airways, visible airways are patent.  Upper Abdomen: Incidental imaging of upper abdominal contents with signs of hepatic steatosis, moderate to marked. No acute upper abdominal process on very limited assessment.  Musculoskeletal: Spinal degenerative changes. No acute or destructive bone process.  IMPRESSION: 1. Suspicion for interstitial lung disease as outlined in prior CT imaging. 2. Hepatic steatosis.   Electronically Signed By: Zara Heymann M.D. On: 09/13/2020 19:01  Narrative CLINICAL DATA:  78 year old with angina.  EXAM: Cardiac/Coronary  CTA  TECHNIQUE: The patient was scanned on a [employer].  FINDINGS: A 130 kV prospective scan was triggered in the descending thoracic aorta at 111 HU's. Axial non-contrast 3 mm slices were carried out through the heart. The data set was analyzed on a dedicated work station and scored using the Agatson method. Gantry rotation speed was 250 msecs and collimation was .6 mm. Beta blockade and 0.8 mg of sl NTG was given. The 3D data set was reconstructed in 5% intervals of the 67-82 % of the R-R cycle. Diastolic phases were analyzed on a dedicated work station using MPR, MIP and VRT modes. The patient received 80 cc of contrast.  Poor image quality: Misregistration artifact significant.  Aorta:  Normal size.  Aortic atherosclerosis.  Aortic Valve: No calcifications.  Coronary Arteries:  Normal coronary origin.  Right dominance.  RCA is a large dominant artery that gives rise to PDA and PLA. There is significant calcified plaque (Unable to interpret given severe misregistration).  Left main is a large calcified artery that gives rise to LAD and LCX arteries. Unable to interpret given severe misregistration at the level of left main.  LAD - diffuse calcified plaque. Unable to interpret lumen given severe misregistration  LCX is a calcified vessel. Unable to interpret given  severe misregistration.  Other findings:  Normal pulmonary vein drainage into the left atrium.  Normal left atrial appendage without a thrombus.  Normal size of the pulmonary artery.  Air from injection noted in apex of right ventricle.  Please see radiology report for non cardiac findings.  IMPRESSION: 1. Coronary calcium  score of 2605. This was 5 percentile for age and sex matched control.  2. Normal coronary origin with right dominance.  3. Severe multivessel coronary plaque. Unable to interpret lumen given severe misregistration. Recommend cardiac catheterization for further clarification.  Electronically Signed: By: Dorothye Gathers MD On: 09/13/2020 11:38          Risk Assessment/Calculations:    CHA2DS2-VASc Score = 4   This indicates a 4.8% annual risk of stroke. The patient's score is based upon: CHF History: 0 HTN History: 1 Diabetes History: 0 Stroke History: 0 Vascular Disease History: 1 Age Score: 2 Gender Score: 0         Physical Exam:   VS:  BP (!) 164/84 (BP Location: Left Arm, Patient Position: Sitting, Cuff Size: Normal)   Pulse (!) 58   Ht 6\' 1"  (1.854 m)   Wt 233 lb (105.7 kg)   SpO2 95%   BMI 30.74 kg/m    Wt Readings from Last 3 Encounters:  03/25/23 233 lb (105.7 kg)  09/11/22 242 lb 6.4 oz (110 kg)  12/14/21 249 lb 9.6 oz (113.2 kg)    GEN: Well nourished, well developed in no acute distress NECK: No JVD; No carotid bruits CARDIAC: RRR, no murmurs, rubs, gallops RESPIRATORY:  Clear to auscultation without rales, wheezing or rhonchi  ABDOMEN: Soft, non-tender, non-distended EXTREMITIES:  No edema; No deformity   ASSESSMENT AND PLAN: .   CAD-s/p CABG x 3 in 2022, Stable with no anginal symptoms. No indication for ischemic evaluation.  Continue aspirin  81 mg daily, continue metoprolol  50 mg daily, continue Pravachol  but will increase his dose to 40 mg daily, continue fenofibrate  160 mg daily.  AAA without rupture-CT of the chest  in October 2024 revealed ectasia of the ascending thoracic aorta at 4.2 cm with recommendation for repeat imaging in 1 year which has already been arranged.  Hypertension-blood pressure is elevated 164/84, he states he typically gets consistently elevated readings at home like this.  He is no longer on Exforge, states he thought he was post to discontinue this although we cannot find any evidence that this should have been discontinued from our perspective however he also follows with the [place] and he is just not entirely clear what he should be taken.  I am going to restart him on amlodipine  5 mg daily and have him keep a blood pressure log for 2 weeks.    Dyslipidemia-his lipid panel was recently checked by his PCP which reveals LDL of 84, triglycerides of 207, will increase his Pravachol  to 40 mg daily and recheck FLP and LFTs in 6 weeks.  Paroxysmal atrial fibrillation-this was on  the postoperative setting following his CABG, he has had no recurrent episodes.  His CHA2DS2-VASc score is 4 if there are repeat episodes of atrial fibrillation in the future then we need to start him on DOAC.        Dispo: Start amlodipine  5 mg daily, increase pravastatin  to 40 mg daily, repeat FLP and LFTs in 6 weeks, follow-up for blood pressure check in 8 weeks.  Signed, Terrance Ferretti, NP

## 2023-03-25 ENCOUNTER — Encounter: Payer: Self-pay | Admitting: Cardiology

## 2023-03-25 ENCOUNTER — Ambulatory Visit: Payer: Medicare PPO | Attending: Cardiology | Admitting: Cardiology

## 2023-03-25 VITALS — BP 164/84 | HR 58 | Ht 73.0 in | Wt 233.0 lb

## 2023-03-25 DIAGNOSIS — E782 Mixed hyperlipidemia: Secondary | ICD-10-CM | POA: Diagnosis not present

## 2023-03-25 DIAGNOSIS — I25118 Atherosclerotic heart disease of native coronary artery with other forms of angina pectoris: Secondary | ICD-10-CM

## 2023-03-25 DIAGNOSIS — I10 Essential (primary) hypertension: Secondary | ICD-10-CM | POA: Diagnosis not present

## 2023-03-25 DIAGNOSIS — I719 Aortic aneurysm of unspecified site, without rupture: Secondary | ICD-10-CM | POA: Diagnosis not present

## 2023-03-25 DIAGNOSIS — I480 Paroxysmal atrial fibrillation: Secondary | ICD-10-CM | POA: Diagnosis not present

## 2023-03-25 DIAGNOSIS — Z951 Presence of aortocoronary bypass graft: Secondary | ICD-10-CM | POA: Diagnosis not present

## 2023-03-25 MED ORDER — AMLODIPINE BESYLATE 5 MG PO TABS: 5.0000 mg | ORAL_TABLET | Freq: Every day | ORAL | 3 refills | Status: DC

## 2023-03-25 MED ORDER — PRAVASTATIN SODIUM 40 MG PO TABS: 40.0000 mg | ORAL_TABLET | Freq: Every evening | ORAL | 3 refills | Status: DC

## 2023-03-25 NOTE — Patient Instructions (Signed)
 Medication Instructions:  Your physician has recommended you make the following change in your medication:  Start Amlodipine  5 mg once daily Increase Pravastatin  to 40 mg once daily  *If you need a refill on your cardiac medications before your next appointment, please call your pharmacy*   Lab Work: Your physician recommends that you return for lab work in: 6 Weeks for Complete Metabolic Panel, Fasting Lipid Panel Lab opens at 8am. You DO NOT NEED an appointment. Best time to come is between 8am and 11:45 and between 1:30 and 4:30. If you have been asked to fast for your blood work please have nothing to eat or drink after midnight. You may have water.    If you have labs (blood work) drawn today and your tests are completely normal, you will receive your results only by: MyChart Message (if you have MyChart) OR A paper copy in the mail If you have any lab test that is abnormal or we need to change your treatment, we will call you to review the results.   Testing/Procedures: NONE   Follow-Up: At [facility], you and your health needs are our priority.  As part of our continuing mission to provide you with exceptional heart care, we have created designated Provider Care Teams.  These Care Teams include your primary Cardiologist (physician) and Advanced Practice Providers (APPs -  Physician Assistants and Nurse Practitioners) who all work together to provide you with the care you need, when you need it.  We recommend signing up for the patient portal called "MyChart".  Sign up information is provided on this After Visit Summary.  MyChart is used to connect with patients for Virtual Visits (Telemedicine).  Patients are able to view lab/test results, encounter notes, upcoming appointments, etc.  Non-urgent messages can be sent to your provider as well.   To learn more about what you can do with MyChart, go to [email or web address].    Your next appointment:   8  week(s)  Provider:   Pattricia Bores, NP [facility])    Other Instructions Check and record BP two times daily, once in the morning 1-2 hours after taking medication and once in the evening before dinner. You can drop off log at our office or send it through MyChart.

## 2023-03-27 ENCOUNTER — Telehealth: Payer: Self-pay | Admitting: Cardiology

## 2023-03-27 NOTE — Telephone Encounter (Signed)
Spoke to patient who reports that he is confused on which medication to take. He reports that he has been taking amlodipine-valsartan 10-320 mg once a day, and he was prescribed amlodipine 5 mg on 03/25/2023. Explained to patient that per [facility] note  he did not think he was taking this medication, that is why she prescribed amlodipine 5 mg once a day. Advised patient not to take both medications. Explained to patient I would make Wallis Bamberg aware and call him after speaking to [employer]. Patient verbalized understanding and had no further questions.

## 2023-03-27 NOTE — Telephone Encounter (Signed)
  Pt c/o medication issue:  1. Name of Medication: amLODipine (NORVASC) 5 MG tablet  amlodipine valsartan 10-320 mg  2. How are you currently taking this medication (dosage and times per day)?   3. Are you having a reaction (difficulty breathing--STAT)? No   4. What is your medication issue? Pt's wife calling requesting to speak with Jennifer's nurse to discuss these medication. They are confused on how will the patient take these medications. She requested to call her back at [phone]

## 2023-03-28 NOTE — Addendum Note (Signed)
Addended by: Lonia Farber on: 03/28/2023 09:59 AM   Modules accepted: Orders

## 2023-03-28 NOTE — Telephone Encounter (Signed)
Called and spoke to patient and reviewed Phillip Terry note with patient. Patient verbalized understanding and stated that he would continue the amlodipine-valsartan 10-320 mg once a day  and not take amlodipine 5 mg. He verbalized understanding regarding keeping a blood pressure log. He had no further questions.

## 2023-05-06 ENCOUNTER — Telehealth: Payer: Self-pay | Admitting: Cardiology

## 2023-05-06 DIAGNOSIS — I719 Aortic aneurysm of unspecified site, without rupture: Secondary | ICD-10-CM | POA: Diagnosis not present

## 2023-05-06 DIAGNOSIS — E782 Mixed hyperlipidemia: Secondary | ICD-10-CM | POA: Diagnosis not present

## 2023-05-06 DIAGNOSIS — I480 Paroxysmal atrial fibrillation: Secondary | ICD-10-CM | POA: Diagnosis not present

## 2023-05-06 DIAGNOSIS — I25118 Atherosclerotic heart disease of native coronary artery with other forms of angina pectoris: Secondary | ICD-10-CM | POA: Diagnosis not present

## 2023-05-06 DIAGNOSIS — I10 Essential (primary) hypertension: Secondary | ICD-10-CM | POA: Diagnosis not present

## 2023-05-06 DIAGNOSIS — Z951 Presence of aortocoronary bypass graft: Secondary | ICD-10-CM | POA: Diagnosis not present

## 2023-05-06 NOTE — Telephone Encounter (Signed)
 Called and spoke to patient and reviewed Phillip Terry note regarding his blood pressure log. Patient wishes to keep appt on 05/20/2023. He had no further questions.

## 2023-05-07 LAB — COMPREHENSIVE METABOLIC PANEL WITH GFR
ALT: 20 IU/L (ref 0–44)
AST: 32 IU/L (ref 0–40)
Albumin: 4.5 g/dL (ref 3.8–4.8)
Alkaline Phosphatase: 141 IU/L — ABNORMAL HIGH (ref 44–121)
BUN/Creatinine Ratio: 15 (ref 10–24)
BUN: 16 mg/dL (ref 8–27)
Bilirubin Total: 0.8 mg/dL (ref 0.0–1.2)
CO2: 20 mmol/L (ref 20–29)
Calcium: 10 mg/dL (ref 8.6–10.2)
Chloride: 103 mmol/L (ref 96–106)
Creatinine, Ser: 1.07 mg/dL (ref 0.76–1.27)
Globulin, Total: 3.6 g/dL (ref 1.5–4.5)
Glucose: 114 mg/dL — ABNORMAL HIGH (ref 70–99)
Potassium: 4.8 mmol/L (ref 3.5–5.2)
Sodium: 139 mmol/L (ref 134–144)
Total Protein: 8.1 g/dL (ref 6.0–8.5)
eGFR: 72 mL/min/1.73

## 2023-05-07 LAB — LIPID PANEL
Chol/HDL Ratio: 3.8 ratio (ref 0.0–5.0)
Cholesterol, Total: 118 mg/dL (ref 100–199)
HDL: 31 mg/dL — ABNORMAL LOW
LDL Chol Calc (NIH): 62 mg/dL (ref 0–99)
Triglycerides: 139 mg/dL (ref 0–149)
VLDL Cholesterol Cal: 25 mg/dL (ref 5–40)

## 2023-05-09 ENCOUNTER — Telehealth: Payer: Self-pay | Admitting: Emergency Medicine

## 2023-05-09 NOTE — Telephone Encounter (Signed)
 Called and spoke to patient and reviewed result note as per [facility] note. Patient verbalized understanding and had no further questions.

## 2023-05-09 NOTE — Telephone Encounter (Signed)
-----   Message from Flossie Dibble sent at 05/07/2023 10:49 AM EDT ----- Slight increase in his alkaline phosphate however this is been elevated before.  Cholesterol is well-managed, continue current medication regimen.

## 2023-05-17 NOTE — Progress Notes (Signed)
 Cardiology Office Note:  .   Date:  05/17/2023  ID:  Phillip Terry, DOB 06-07-1945, MRN [id number] PCP: Paulina Fusi, MD  [facility] Providers Cardiologist:  Norman Herrlich, MD    History of Present Illness: .   Phillip Terry is a 78 y.o. male with a past medical history of CAD s/p CABG x 3 2022, AAA without rupture, PAF--postoperatively following his CABG with no recurrent episodes, hypertension, OSA on CPAP, pulmonary fibrosis, gout, dyslipidemia, obesity.  11/12/2022 CT of the chest ectasia of the ascending aorta 4.2 cm in diameter recommendation for annual repeat imaging 10/05/2022 echo EF 55 to 60%, mild concentric LVH, mild MR, moderate dilatation of the ascending aortic root 40 mm >> CT of chest 12/08/2020 monitor average heart rate 62 bpm, predominant rhythm was sinus, 2 episodes of SVT, no A-fib 10/11/2020 CABG x 3 LIMA to LAD, reverse saphenous vein to PDA, reverse saphenous vein to diagonal brancbefore the nurse brought h 10/10/2020 carotid duplex mild stenosis in the left ICA, near normal in the right ICA  Evaluated by Dr. Dulce Sellar on 09/11/2022, he was stable regarding his CAD.  He did have some shortness of breath so an echocardiogram was arranged--although this was felt to be related to his pulmonary fibrosis and LV dysfunction, echocardiogram was unrevealing for contributory causes of shortness of breath however did reveal dilatation of his ascending aorta.  CTA of the chest was arranged which revealed 4.2 cm dilatation with recommendation for repeat imaging yearly.  Evaluated by myself on 03/25/2023 for follow up of his CAD, there was some confusion surrounding his medication and his blood pressure was not well-controlled, may have inadvertently stopped his Exforge which we restarted.  He presents today for follow-up of his hypertension, his blood pressure is 140/72 in the office today however he checks his blood pressure at home with a cuff provided at the [place] that submit readings  to them and its typically around 120/70.  He does have a high level of personal stress caring for his wife who is chronically ill.  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 He denies chest pain, palpitations, dyspnea, pnd, orthopnea, n, v, dizziness, syncope, edema, weight gain, or early satiety.    ROS: Review of Systems  All other systems reviewed and are negative.    Studies Reviewed: .        Cardiac Studies & Procedures   ______________________________________________________________________________________________ CARDIAC CATHETERIZATION  CARDIAC CATHETERIZATION 09/23/2020  Narrative   Prox RCA to Mid RCA lesion is 60-70% stenosed.   Dist Cx lesion is 50% stenosed.   Mid LAD lesion is 75% stenosed.  This is a long segment of disease starting immediately after the large diagonal branch.   Prox LAD lesion is 90% stenosed.   2nd Diag lesion is 80% stenosed.  This is a small vessel which likely could be treated medically.   RPAV lesion is 50% stenosed.   The left ventricular systolic function is normal.   LV end diastolic pressure is normal.   The left ventricular ejection fraction is 55-65% by visual estimate.   There is no aortic valve stenosis.  Severe, calcific disease most notably in the proximal and mid LAD.  The long segment of disease in the mid LAD involves several diagonal vessel ostia.  Moderate disease in the RCA.  We will plan for cardiac surgery referral for consideration of robotic mid CAB procedure with LIMA  to LAD.  I suspect his symptoms would resolve with just that revascularization.  If symptoms persisted, the RCA is certainly treatable percutaneously and a hybrid approach to his revascularization could be considered.  Severe tortuosity of the right subclavian.  If intervention was needed, would consider using long destination sheath from the right radial to engage the RCA.  Engaging the left main  was much easier.  Results discussed with the patient's daughter.  Echocardiogram will be obtained.  Referral made to Dr. Cliffton Asters.  Findings Coronary Findings Diagnostic  Dominance: Right  Left Anterior Descending The vessel exhibits minimal luminal irregularities. Prox LAD lesion is 90% stenosed. The lesion is severely calcified. Mid LAD lesion is 75% stenosed. The lesion is severely calcified.  Second Diagonal Branch Vessel is small in size. 2nd Diag lesion is 80% stenosed.  Left Circumflex Dist Cx lesion is 50% stenosed.  Right Coronary Artery Prox RCA to Mid RCA lesion is 60% stenosed.  Right Posterior Atrioventricular Artery RPAV lesion is 50% stenosed.  Intervention  No interventions have been documented.   STRESS TESTS  MYOCARDIAL PERFUSION IMAGING 08/26/2019   ECHOCARDIOGRAM  ECHOCARDIOGRAM COMPLETE 10/05/2022  Narrative ECHOCARDIOGRAM REPORT    Patient Name:   Phillip Terry Date of Exam: 10/05/2022 Medical Rec #:  [id number]  Height:       72.0 in Accession #:    [id number] Weight:       242.4 lb Date of Birth:  [birth year]  BSA:          2.312 m Patient Age:    77 years   BP:           132/66 mmHg Patient Gender: M          HR:           62 bpm. Exam Location:  [place]  Procedure: 2D Echo, Cardiac Doppler, Color Doppler and Strain Analysis  Indications:    Coronary artery disease of native artery of native heart with stable angina pectoris (HCC) [I25.118 (ICD-10-CM)]; S/P CABG x 3 [Z95.1 (ICD-10-CM)]; Essential hypertension [I10 (ICD-10-CM)]; Mixed hyperlipidemia [E78.2 (ICD-10-CM)]; PAF (paroxysmal atrial fibrillation) (HCC) [I48.0 (ICD-10-CM)]; Pulmonary fibrosis (HCC) [J84.10 (ICD-10-CM)]  History:        Patient has prior history of Echocardiogram examinations, most recent 10/07/2020. Prior CABG, Arrythmias:Atrial Fibrillation; Risk Factors:Hypertension and Dyslipidemia.  Sonographer:    Margreta Journey RDCS Referring Phys: [id number] BRIAN J  MUNLEY  IMPRESSIONS   1. Left ventricular ejection fraction, by estimation, is 55 to 60%. The left ventricle has normal function. The left ventricle has no regional wall motion abnormalities. There is mild concentric left ventricular hypertrophy. Left ventricular diastolic parameters were normal. 2. Right ventricular systolic function is normal. The right ventricular size is mildly enlarged. There is normal pulmonary artery systolic pressure. 3. The mitral valve is normal in structure. Mild mitral valve regurgitation. No evidence of mitral stenosis. 4. The aortic valve is tricuspid. Aortic valve regurgitation is not visualized. No aortic stenosis is present. 5. Aortic Normal DTA. There is moderate dilatation of the ascending aorta and of the aortic root, measuring 40 mm. 6. The inferior vena cava is normal in size with greater than 50% respiratory variability, suggesting right atrial pressure of 3 mmHg.  FINDINGS Left Ventricle: Left ventricular ejection fraction, by estimation, is 55 to 60%. The left ventricle has normal function. The left ventricle has no regional wall motion abnormalities. Global longitudinal strain performed but not reported based on interpreter judgement due to suboptimal tracking. The left ventricular  internal cavity size was normal in size. There is mild concentric left ventricular hypertrophy. Left ventricular diastolic parameters were normal. Indeterminate filling pressures.  Right Ventricle: The right ventricular size is mildly enlarged. No increase in right ventricular wall thickness. Right ventricular systolic function is normal. There is normal pulmonary artery systolic pressure. The tricuspid regurgitant velocity is 2.35 m/s, and with an assumed right atrial pressure of 8 mmHg, the estimated right ventricular systolic pressure is 30.1 mmHg.  Left Atrium: Left atrial size was normal in size.  Right Atrium: Right atrial size was normal in size.  Pericardium: There  is no evidence of pericardial effusion.  Mitral Valve: The mitral valve is normal in structure. Mild mitral valve regurgitation. No evidence of mitral valve stenosis.  Tricuspid Valve: The tricuspid valve is normal in structure. Tricuspid valve regurgitation is mild . No evidence of tricuspid stenosis.  Aortic Valve: The aortic valve is tricuspid. Aortic valve regurgitation is not visualized. No aortic stenosis is present.  Pulmonic Valve: The pulmonic valve was normal in structure. Pulmonic valve regurgitation is not visualized. No evidence of pulmonic stenosis.  Aorta: The aortic arch was not well visualized, the aortic root is normal in size and structure and Normal DTA. There is moderate dilatation of the ascending aorta and of the aortic root, measuring 40 mm.  Venous: A normal flow pattern is recorded from the right upper pulmonary vein. The inferior vena cava is normal in size with greater than 50% respiratory variability, suggesting right atrial pressure of 3 mmHg.  IAS/Shunts: No atrial level shunt detected by color flow Doppler.   LEFT VENTRICLE PLAX 2D LVIDd:         4.70 cm   Diastology LVIDs:         3.40 cm   LV e' medial:   6.09 cm/s LV PW:         1.20 cm   LV E/e' medial: 16.1 LV IVS:        1.20 cm LVOT diam:     2.30 cm LV SV:         97 LV SV Index:   42 LVOT Area:     4.15 cm   RIGHT VENTRICLE            IVC RV Basal diam:  4.50 cm    IVC diam: 2.30 cm RV Mid diam:    4.20 cm RV S prime:     8.49 cm/s TAPSE (M-mode): 2.0 cm  LEFT ATRIUM             Index        RIGHT ATRIUM           Index LA diam:        4.70 cm 2.03 cm/m   RA Area:     16.40 cm LA Vol (A2C):   58.5 ml 25.30 ml/m  RA Volume:   38.70 ml  16.74 ml/m LA Vol (A4C):   60.0 ml 25.95 ml/m LA Biplane Vol: 60.8 ml 26.30 ml/m AORTIC VALVE             PULMONIC VALVE LVOT Vmax:   99.27 cm/s  PR End Diast Vel: 6.15 msec LVOT Vmean:  66.800 cm/s LVOT VTI:    0.234 m  AORTA Ao Root diam:  4.20 cm Ao Asc diam:  3.90 cm Ao Desc diam: 2.10 cm  MITRAL VALVE               TRICUSPID VALVE MV Area (  PHT): 3.51 cm    TR Peak grad:   22.1 mmHg MV Decel Time: 216 msec    TR Vmax:        235.00 cm/s MV E velocity: 98.17 cm/s MV A velocity: 85.33 cm/s  SHUNTS MV E/A ratio:  1.15        Systemic VTI:  0.23 m Systemic Diam: 2.30 cm  Norman Herrlich MD Electronically signed by Norman Herrlich MD Signature Date/Time: 10/05/2022/11:58:13 AM    Final   TEE  ECHO INTRAOPERATIVE TEE 10/11/2020  Narrative *INTRAOPERATIVE TRANSESOPHAGEAL REPORT *    Patient Name:   Phillip Terry     Date of Exam: 10/11/2020 Medical Rec #:  [id number]      Height:       72.0 in Accession #:    [id number]     Weight:       240.7 lb Date of Birth:  09/15/1945      BSA:          2.31 m Patient Age:    75 years       BP:           160/70 mmHg Patient Gender: M              HR:           70 bpm. Exam Location:  Anesthesiology  Transesophogeal exam was perform intraoperatively during surgical procedure. Patient was closely monitored under general anesthesia during the entirety of examination.  Indications:     I25.110 Atherosclerotic heart disease of native coronary artery with unstable angina pectoris Sonographer:     Lavenia Atlas RDCS Performing Phys: Leslye Peer MD Diagnosing Phys: Leslye Peer MD  Complications: No known complications during this procedure. POST-OP IMPRESSIONS _ Left Ventricle: The left ventricle is unchanged from pre-bypass. _ Right Ventricle: The right ventricle appears unchanged from pre-bypass. _ Aorta: The aorta appears unchanged from pre-bypass. _ Left Atrium: The left atrium appears unchanged from pre-bypass. _ Left Atrial Appendage: The left atrial appendage appears unchanged from pre-bypass. _ Aortic Valve: The aortic valve appears unchanged from pre-bypass. _ Mitral Valve: The mitral valve appears unchanged from pre-bypass. _ Tricuspid Valve: The tricuspid valve  appears unchanged from pre-bypass. _ Pulmonic Valve: The pulmonic valve appears unchanged from pre-bypass. _ Interatrial Septum: The interatrial septum appears unchanged from pre-bypass. _ Interventricular Septum: The interventricular septum appears unchanged from pre-bypass. _ Pericardium: The pericardium appears unchanged from pre-bypass.  PRE-OP FINDINGS Left Ventricle: The left ventricle has normal systolic function, with an ejection fraction of 60-65%. The cavity size was normal. There is no left ventricular hypertrophy.   Right Ventricle: The right ventricle has normal systolic function. The cavity was normal. There is no increase in right ventricular wall thickness.  Left Atrium: Left atrial size was normal in size. No left atrial/left atrial appendage thrombus was detected. There is continuous echo contrast seen in the left atrial cavity.  Right Atrium: Right atrial size was normal in size. Prominent Chiari network.  Interatrial Septum: No atrial level shunt detected by color flow Doppler.  Pericardium: There is no evidence of pericardial effusion.  Mitral Valve: The mitral valve is normal in structure. Mitral valve regurgitation is trivial by color flow Doppler. There is No evidence of mitral stenosis.  Tricuspid Valve: The tricuspid valve was normal in structure. Tricuspid valve regurgitation is trivial by color flow Doppler. No evidence of tricuspid stenosis is present.  Aortic Valve: The aortic valve is tricuspid Aortic valve regurgitation was not  visualized by color flow Doppler. There is no stenosis of the aortic valve.   Pulmonic Valve: The pulmonic valve was not assessed. Pulmonic valve regurgitation was not assessed by color flow Doppler.    Leslye Peer MD Electronically signed by Leslye Peer MD Signature Date/Time: 10/11/2020/5:15:53 PM    Final  MONITORS  LONG TERM MONITOR (3-14 DAYS) 12/06/2020  Narrative Patch Wear Time:  3 days and 0 hours  (2022-10-14T19:56:03-398 to 2022-10-17T19:58:35-0400)  Patient had a min HR of 47 bpm, max HR of 115 bpm, and avg HR of 62 bpm. Predominant underlying rhythm was Sinus Rhythm. 2 Supraventricular Tachycardia runs occurred, the run with the fastest interval lasting 4 beats with a max rate of 115 bpm, the longest lasting 5 beats with an avg rate of 100 bpm. Isolated SVEs were rare (<1.0%), SVE Couplets were rare (<1.0%), and SVE Triplets were rare (<1.0%). Isolated VEs were rare (<1.0%), VE Couplets were rare (<1.0%), and no VE Triplets were present.  Ventricular ectopy was rare Supraventricular ectopy is rare no episodes of atrial fibrillation or flutter.  There were 2 brief episodes of atrial tachycardia longest 5 beats and duration heart rate of 100 bpm.  There were no triggered or diary events  Conclusion unremarkable 3-day event monitor   CT SCANS  CT CORONARY MORPH W/CTA COR W/SCORE 09/13/2020  Addendum 09/13/2020  7:03 PM ADDENDUM REPORT: 09/13/2020 19:01  EXAM: OVER-READ INTERPRETATION  CT CHEST  The following report is an over-read performed by radiologist Dr. Lovie Chol [facility] on 09/13/2020. This over-read does not include interpretation of cardiac or coronary anatomy or pathology. The coronary CT a and coronary calcium interpretation by the cardiologist is attached. Chest with limited coverage through the heart including the mid chest only.  COMPARISON:  Chest CT from September 2021  FINDINGS: Cardiovascular: Please see dedicated report from cardiology for further detail regarding cardiac findings.  Mediastinum/Nodes: No adenopathy in the visible mediastinum or hila. Esophagus grossly unremarkable with respect to visualized portions.  Lungs/Pleura: Mild subpleural reticulation bilaterally. Potential interstitial lung disease as outlined in prior CT imaging. No effusion. No consolidative changes. Airways, visible airways are patent.  Upper Abdomen:  Incidental imaging of upper abdominal contents with signs of hepatic steatosis, moderate to marked. No acute upper abdominal process on very limited assessment.  Musculoskeletal: Spinal degenerative changes. No acute or destructive bone process.  IMPRESSION: 1. Suspicion for interstitial lung disease as outlined in prior CT imaging. 2. Hepatic steatosis.   Electronically Signed By: Donzetta Kohut M.D. On: 09/13/2020 19:01  Narrative CLINICAL DATA:  78 year old with angina.  EXAM: Cardiac/Coronary  CTA  TECHNIQUE: The patient was scanned on a [employer].  FINDINGS: A 130 kV prospective scan was triggered in the descending thoracic aorta at 111 HU's. Axial non-contrast 3 mm slices were carried out through the heart. The data set was analyzed on a dedicated work station and scored using the Agatson method. Gantry rotation speed was 250 msecs and collimation was .6 mm. Beta blockade and 0.8 mg of sl NTG was given. The 3D data set was reconstructed in 5% intervals of the 67-82 % of the R-R cycle. Diastolic phases were analyzed on a dedicated work station using MPR, MIP and VRT modes. The patient received 80 cc of contrast.  Poor image quality: Misregistration artifact significant.  Aorta:  Normal size.  Aortic atherosclerosis.  Aortic Valve: No calcifications.  Coronary Arteries:  Normal coronary origin.  Right dominance.  RCA is a  large dominant artery that gives rise to PDA and PLA. There is significant calcified plaque (Unable to interpret given severe misregistration).  Left main is a large calcified artery that gives rise to LAD and LCX arteries. Unable to interpret given severe misregistration at the level of left main.  LAD - diffuse calcified plaque. Unable to interpret lumen given severe misregistration  LCX is a calcified vessel. Unable to interpret given severe misregistration.  Other findings:  Normal pulmonary vein drainage into the  left atrium.  Normal left atrial appendage without a thrombus.  Normal size of the pulmonary artery.  Air from injection noted in apex of right ventricle.  Please see radiology report for non cardiac findings.  IMPRESSION: 1. Coronary calcium score of 2605. This was 49 percentile for age and sex matched control.  2. Normal coronary origin with right dominance.  3. Severe multivessel coronary plaque. Unable to interpret lumen given severe misregistration. Recommend cardiac catheterization for further clarification.  Electronically Signed: By: Donato Schultz MD On: 09/13/2020 11:38     ______________________________________________________________________________________________      Risk Assessment/Calculations:    CHA2DS2-VASc Score = 4   This indicates a 4.8% annual risk of stroke. The patient's score is based upon: CHF History: 0 HTN History: 1 Diabetes History: 0 Stroke History: 0 Vascular Disease History: 1 Age Score: 2 Gender Score: 0         Physical Exam:   VS:  There were no vitals taken for this visit.   Wt Readings from Last 3 Encounters:  03/25/23 233 lb (105.7 kg)  09/11/22 242 lb 6.4 oz (110 kg)  12/14/21 249 lb 9.6 oz (113.2 kg)    GEN: Well nourished, well developed in no acute distress NECK: No JVD; No carotid bruits CARDIAC: RRR, no murmurs, rubs, gallops RESPIRATORY:  Clear to auscultation without rales, wheezing or rhonchi  ABDOMEN: Soft, non-tender, non-distended EXTREMITIES:  No edema; No deformity   ASSESSMENT AND PLAN: .   CAD-s/p CABG x 3 in 2022, Stable with no anginal symptoms. No indication for ischemic evaluation.  Continue aspirin 81 mg daily, continue metoprolol 50 mg daily, continue Pravachol  40 mg daily, continue fenofibrate 160 mg daily.  AAA without rupture-CT of the chest in October 2024 revealed ectasia of the ascending thoracic aorta at 4.2 cm with recommendation for repeat imaging in 1 year which has already been  arranged.  Hypertension-blood pressure is slightly elevated in the office today at 140/72 however is typically well-controlled at home, continue Exforge 10-320 mg daily, continue metoprolol 50 mg twice daily.  Dyslipidemia-we increased his Pravachol at the last OV and then rechecked his lipids, currently well-controlled with LDL at 62 on 05/06/2023.  Continue Pravachol 40 mg daily, continue fenofibrate 160 mg daily  Paroxysmal atrial fibrillation-this was on the postoperative setting following his CABG, he has had no recurrent episodes.  His CHA2DS2-VASc score is 4 if there are repeat episodes of atrial fibrillation in the future then we need to start him on DOAC.        Dispo: 20-month follow-up.   Signed, Flossie Dibble, NP

## 2023-05-20 ENCOUNTER — Ambulatory Visit: Payer: Medicare PPO | Attending: Cardiology | Admitting: Cardiology

## 2023-05-20 ENCOUNTER — Encounter: Payer: Self-pay | Admitting: Cardiology

## 2023-05-20 VITALS — BP 140/72 | HR 52 | Ht 72.0 in | Wt 229.4 lb

## 2023-05-20 DIAGNOSIS — I10 Essential (primary) hypertension: Secondary | ICD-10-CM

## 2023-05-20 DIAGNOSIS — I480 Paroxysmal atrial fibrillation: Secondary | ICD-10-CM | POA: Diagnosis not present

## 2023-05-20 DIAGNOSIS — Z951 Presence of aortocoronary bypass graft: Secondary | ICD-10-CM

## 2023-05-20 DIAGNOSIS — I25118 Atherosclerotic heart disease of native coronary artery with other forms of angina pectoris: Secondary | ICD-10-CM

## 2023-05-20 DIAGNOSIS — I719 Aortic aneurysm of unspecified site, without rupture: Secondary | ICD-10-CM

## 2023-05-20 NOTE — Patient Instructions (Signed)
 Medication Instructions:  Your physician recommends that you continue on your current medications as directed. Please refer to the Current Medication list given to you today.  *If you need a refill on your cardiac medications before your next appointment, please call your pharmacy*   Lab Work: None Ordered If you have labs (blood work) drawn today and your tests are completely normal, you will receive your results only by: MyChart Message (if you have MyChart) OR A paper copy in the mail If you have any lab test that is abnormal or we need to change your treatment, we will call you to review the results.   Testing/Procedures: None Ordered   Follow-Up: At [facility], you and your health needs are our priority.  As part of our continuing mission to provide you with exceptional heart care, we have created designated Provider Care Teams.  These Care Teams include your primary Cardiologist (physician) and Advanced Practice Providers (APPs -  Physician Assistants and Nurse Practitioners) who all work together to provide you with the care you need, when you need it.  We recommend signing up for the patient portal called "MyChart".  Sign up information is provided on this After Visit Summary.  MyChart is used to connect with patients for Virtual Visits (Telemedicine).  Patients are able to view lab/test results, encounter notes, upcoming appointments, etc.  Non-urgent messages can be sent to your provider as well.   To learn more about what you can do with MyChart, go to [email or web address].    Your next appointment:   6 month follow up

## 2023-06-20 ENCOUNTER — Telehealth: Payer: Self-pay | Admitting: Cardiology

## 2023-06-20 NOTE — Telephone Encounter (Signed)
 Spoke with pt regarding Amlodipine  and Amplodipine/Valsartan10-320. Advised per Davie Essex note that he should be taking only the Amlodipine  /valsartan10-320. Pt verbalized understanding and had no further questions.

## 2023-06-20 NOTE — Telephone Encounter (Signed)
 Pt c/o medication issue:  1. Name of Medication:   amLODipine  (NORVASC ) 5 MG tablet   2. How are you currently taking this medication (dosage and times per day)?   3. Are you having a reaction (difficulty breathing--STAT)?   4. What is your medication issue?   Wife Adriana Hopping) stated patient's medication was changed to amLODipine -valsartan (EXFORGE) 10-320 MG tablet and she wants a call back to confirm the medication change.  Wife stated can call patient directly at [phone].

## 2023-06-25 DIAGNOSIS — M109 Gout, unspecified: Secondary | ICD-10-CM | POA: Diagnosis not present

## 2023-08-27 DIAGNOSIS — R7301 Impaired fasting glucose: Secondary | ICD-10-CM | POA: Diagnosis not present

## 2023-08-27 DIAGNOSIS — I2510 Atherosclerotic heart disease of native coronary artery without angina pectoris: Secondary | ICD-10-CM | POA: Diagnosis not present

## 2023-08-27 DIAGNOSIS — Z125 Encounter for screening for malignant neoplasm of prostate: Secondary | ICD-10-CM | POA: Diagnosis not present

## 2023-08-27 DIAGNOSIS — E785 Hyperlipidemia, unspecified: Secondary | ICD-10-CM | POA: Diagnosis not present

## 2023-08-27 DIAGNOSIS — M109 Gout, unspecified: Secondary | ICD-10-CM | POA: Diagnosis not present

## 2023-08-27 DIAGNOSIS — I10 Essential (primary) hypertension: Secondary | ICD-10-CM | POA: Diagnosis not present

## 2023-08-27 DIAGNOSIS — E66811 Obesity, class 1: Secondary | ICD-10-CM | POA: Diagnosis not present

## 2023-08-27 DIAGNOSIS — G4733 Obstructive sleep apnea (adult) (pediatric): Secondary | ICD-10-CM | POA: Diagnosis not present

## 2023-08-28 DIAGNOSIS — I10 Essential (primary) hypertension: Secondary | ICD-10-CM | POA: Diagnosis not present

## 2023-08-28 DIAGNOSIS — M109 Gout, unspecified: Secondary | ICD-10-CM | POA: Diagnosis not present

## 2023-08-28 DIAGNOSIS — R7301 Impaired fasting glucose: Secondary | ICD-10-CM | POA: Diagnosis not present

## 2023-08-28 DIAGNOSIS — E785 Hyperlipidemia, unspecified: Secondary | ICD-10-CM | POA: Diagnosis not present

## 2023-08-28 DIAGNOSIS — Z125 Encounter for screening for malignant neoplasm of prostate: Secondary | ICD-10-CM | POA: Diagnosis not present

## 2023-09-17 DIAGNOSIS — D485 Neoplasm of uncertain behavior of skin: Secondary | ICD-10-CM | POA: Diagnosis not present

## 2023-10-29 DIAGNOSIS — R42 Dizziness and giddiness: Secondary | ICD-10-CM | POA: Diagnosis not present

## 2023-12-23 NOTE — Progress Notes (Unsigned)
 Cardiology Office Note:    Date:  12/24/2023   ID:  Phillip Terry, DOB [birth year], MRN [id number]  PCP:  Phillip Vicenta BRAVO, MD  Cardiologist:  Phillip Leiter, MD    Referring MD: Phillip Vicenta BRAVO, MD    ASSESSMENT:    1. SOB (shortness of breath)   2. Coronary artery disease of native artery of native heart with stable angina pectoris   3. S/P CABG x 3   4. Essential hypertension   5. Mixed hyperlipidemia   6. Pulmonary fibrosis (HCC)   7. RBBB    PLAN:    In order of problems listed above:  Difficulty issues whether shortness of breath is a consequence of his coincident pulmonary fibrosis or recurrent cardiac ischemia 2 choices 1 direct referral to coronary angiography or to a myocardial perfusion study which I think can be helpful following revascularization.  Will start with a perfusion study.  If ischemic hide refer for coronary angiography. His hypertension is well-controlled continue his ARB calcium  channel blocker For CAD continue beta-blocker and lipid-lowering with statin LDL is at target New pattern of right bundle branch block   Next appointment: 3 months   Medication Adjustments/Labs and Tests Ordered: Current medicines are reviewed at length with the patient today.  Concerns regarding medicines are outlined above.  Orders Placed This Encounter  Procedures   MYOCARDIAL PERFUSION IMAGING   EKG 12-Lead   No orders of the defined types were placed in this encounter.    History of Present Illness:    Phillip Terry is a 78 y.o. male with a hx of CAD with CABG August 2022 for multivessel CAD pulmonary fibrosis hypertension hyperlipidemia and paroxysmal atrial fibrillation last seen 09/11/2002.  Compliance with diet, lifestyle and medications: Yes  Phillip Terry is very distressed about his wife's illness apparently she has developed cognitive decline and has ongoing GI symptoms called reflux and is having intolerable diarrhea.  Been seen by multiple GI doctors and there  just is no answer to her problems. He is always had an element of shortness of breath due to pulmonary fibrosis but he finds it more bothersome wonders if he has recurrent ischemia Will do a myocardial perfusion study in my office if abnormal I would refer him for coronary angiography as his initial presentation was 1 of disproportionate severe shortness of breath. He has had no anginal discomfort palpitation or syncope Recent labs from his PCP office 08/28/2023 cholesterol 120 LDL 64 A1c 6.2 hemoglobin 85.1 creatinine 1.10 potassium 4.2 Past Medical History:  Diagnosis Date   Atherosclerotic heart disease of native coronary artery with unspecified angina pectoris 05/04/2021   CAD (coronary artery disease)    CAD in native artery    Calculus of kidney    Chest pain 08/22/2020   DOE (dyspnea on exertion) 10/27/2019   Encounter for fitting and adjustment of hearing aid 05/04/2021   Encounter for other administrative examinations 05/04/2021   Essential hypertension    Gout 05/04/2021   Hearing loss 05/04/2021   Hepatic steatosis    History of asbestos exposure 05/04/2021   Hyperglycemia 10/07/2020   Hyperlipidemia    Impacted cerumen, bilateral 03/12/2023   Obesity 05/04/2021   OSA on CPAP 03/16/2007   RDI 39 Sat 89%   Peripheral neuropathy    Pulmonary fibrosis (HCC) 10/27/2019   S/P CABG x 3 10/14/2020   Sensorineural hearing loss, bilateral 05/04/2021   Unstable angina (HCC) 10/07/2020    Current Medications: Current Meds  Medication Sig   amLODipine -valsartan (  EXFORGE) 10-320 MG tablet Take 1 tablet by mouth daily.   febuxostat  (ULORIC ) 40 MG tablet Take 40 mg by mouth at bedtime.   fenofibrate  160 MG tablet Take 160 mg by mouth at bedtime.   metoprolol  tartrate (LOPRESSOR ) 50 MG tablet Take 50 mg by mouth in the morning and at bedtime.   nitroGLYCERIN  (NITROSTAT ) 0.4 MG SL tablet Place 0.4 mg under the tongue every 5 (five) minutes x 3 doses as needed for chest pain.    pravastatin  (PRAVACHOL ) 40 MG tablet Take 1 tablet (40 mg total) by mouth every evening.      EKGs/Labs/Other Studies Reviewed:    The following studies were reviewed today:  Cardiac Studies & Procedures   ______________________________________________________________________________________________ CARDIAC CATHETERIZATION  CARDIAC CATHETERIZATION 09/23/2020  Conclusion   Prox RCA to Mid RCA lesion is 60-70% stenosed.   Dist Cx lesion is 50% stenosed.   Mid LAD lesion is 75% stenosed.  This is a long segment of disease starting immediately after the large diagonal branch.   Prox LAD lesion is 90% stenosed.   2nd Diag lesion is 80% stenosed.  This is a small vessel which likely could be treated medically.   RPAV lesion is 50% stenosed.   The left ventricular systolic function is normal.   LV end diastolic pressure is normal.   The left ventricular ejection fraction is 55-65% by visual estimate.   There is no aortic valve stenosis.  Severe, calcific disease most notably in the proximal and mid LAD.  The long segment of disease in the mid LAD involves several diagonal vessel ostia.  Moderate disease in the RCA.  We will plan for cardiac surgery referral for consideration of robotic mid CAB procedure with LIMA to LAD.  I suspect his symptoms would resolve with just that revascularization.  If symptoms persisted, the RCA is certainly treatable percutaneously and a hybrid approach to his revascularization could be considered.  Severe tortuosity of the right subclavian.  If intervention was needed, would consider using long destination sheath from the right radial to engage the RCA.  Engaging the left main was much easier.  Results discussed with the patient's daughter.  Echocardiogram will be obtained.  Referral made to Dr. Shyrl.  Findings Coronary Findings Diagnostic  Dominance: Right  Left Anterior Descending The vessel exhibits minimal luminal irregularities. Prox LAD lesion  is 90% stenosed. The lesion is severely calcified. Mid LAD lesion is 75% stenosed. The lesion is severely calcified.  Second Diagonal Branch Vessel is small in size. 2nd Diag lesion is 80% stenosed.  Left Circumflex Dist Cx lesion is 50% stenosed.  Right Coronary Artery Prox RCA to Mid RCA lesion is 60% stenosed.  Right Posterior Atrioventricular Artery RPAV lesion is 50% stenosed.  Intervention  No interventions have been documented.   STRESS TESTS  MYOCARDIAL PERFUSION IMAGING 08/26/2019   ECHOCARDIOGRAM  ECHOCARDIOGRAM COMPLETE 10/05/2022  Narrative ECHOCARDIOGRAM REPORT    Patient Name:   Phillip Terry Date of Exam: 10/05/2022 Medical Rec #:  [id number]  Height:       72.0 in Accession #:    [id number] Weight:       242.4 lb Date of Birth:  [birth year]  BSA:          2.312 m Patient Age:    77 years   BP:           132/66 mmHg Patient Gender: M          HR:  62 bpm. Exam Location:  [place]  Procedure: 2D Echo, Cardiac Doppler, Color Doppler and Strain Analysis  Indications:    Coronary artery disease of native artery of native heart with stable angina pectoris (HCC) [I25.118 (ICD-10-CM)]; S/P CABG x 3 [Z95.1 (ICD-10-CM)]; Essential hypertension [I10 (ICD-10-CM)]; Mixed hyperlipidemia [E78.2 (ICD-10-CM)]; PAF (paroxysmal atrial fibrillation) (HCC) [I48.0 (ICD-10-CM)]; Pulmonary fibrosis (HCC) [J84.10 (ICD-10-CM)]  History:        Patient has prior history of Echocardiogram examinations, most recent 10/07/2020. Prior CABG, Arrythmias:Atrial Fibrillation; Risk Factors:Hypertension and Dyslipidemia.  Sonographer:    Charlie Jointer RDCS Referring Phys: [id number] Shelsy Seng J Samanvi Cuccia  IMPRESSIONS   1. Left ventricular ejection fraction, by estimation, is 55 to 60%. The left ventricle has normal function. The left ventricle has no regional wall motion abnormalities. There is mild concentric left ventricular hypertrophy. Left ventricular diastolic parameters were  normal. 2. Right ventricular systolic function is normal. The right ventricular size is mildly enlarged. There is normal pulmonary artery systolic pressure. 3. The mitral valve is normal in structure. Mild mitral valve regurgitation. No evidence of mitral stenosis. 4. The aortic valve is tricuspid. Aortic valve regurgitation is not visualized. No aortic stenosis is present. 5. Aortic Normal DTA. There is moderate dilatation of the ascending aorta and of the aortic root, measuring 40 mm. 6. The inferior vena cava is normal in size with greater than 50% respiratory variability, suggesting right atrial pressure of 3 mmHg.  FINDINGS Left Ventricle: Left ventricular ejection fraction, by estimation, is 55 to 60%. The left ventricle has normal function. The left ventricle has no regional wall motion abnormalities. Global longitudinal strain performed but not reported based on interpreter judgement due to suboptimal tracking. The left ventricular internal cavity size was normal in size. There is mild concentric left ventricular hypertrophy. Left ventricular diastolic parameters were normal. Indeterminate filling pressures.  Right Ventricle: The right ventricular size is mildly enlarged. No increase in right ventricular wall thickness. Right ventricular systolic function is normal. There is normal pulmonary artery systolic pressure. The tricuspid regurgitant velocity is 2.35 m/s, and with an assumed right atrial pressure of 8 mmHg, the estimated right ventricular systolic pressure is 30.1 mmHg.  Left Atrium: Left atrial size was normal in size.  Right Atrium: Right atrial size was normal in size.  Pericardium: There is no evidence of pericardial effusion.  Mitral Valve: The mitral valve is normal in structure. Mild mitral valve regurgitation. No evidence of mitral valve stenosis.  Tricuspid Valve: The tricuspid valve is normal in structure. Tricuspid valve regurgitation is mild . No evidence of  tricuspid stenosis.  Aortic Valve: The aortic valve is tricuspid. Aortic valve regurgitation is not visualized. No aortic stenosis is present.  Pulmonic Valve: The pulmonic valve was normal in structure. Pulmonic valve regurgitation is not visualized. No evidence of pulmonic stenosis.  Aorta: The aortic arch was not well visualized, the aortic root is normal in size and structure and Normal DTA. There is moderate dilatation of the ascending aorta and of the aortic root, measuring 40 mm.  Venous: A normal flow pattern is recorded from the right upper pulmonary vein. The inferior vena cava is normal in size with greater than 50% respiratory variability, suggesting right atrial pressure of 3 mmHg.  IAS/Shunts: No atrial level shunt detected by color flow Doppler.   LEFT VENTRICLE PLAX 2D LVIDd:         4.70 cm   Diastology LVIDs:         3.40 cm   LV  e' medial:   6.09 cm/s LV PW:         1.20 cm   LV E/e' medial: 16.1 LV IVS:        1.20 cm LVOT diam:     2.30 cm LV SV:         97 LV SV Index:   42 LVOT Area:     4.15 cm   RIGHT VENTRICLE            IVC RV Basal diam:  4.50 cm    IVC diam: 2.30 cm RV Mid diam:    4.20 cm RV S prime:     8.49 cm/s TAPSE (M-mode): 2.0 cm  LEFT ATRIUM             Index        RIGHT ATRIUM           Index LA diam:        4.70 cm 2.03 cm/m   RA Area:     16.40 cm LA Vol (A2C):   58.5 ml 25.30 ml/m  RA Volume:   38.70 ml  16.74 ml/m LA Vol (A4C):   60.0 ml 25.95 ml/m LA Biplane Vol: 60.8 ml 26.30 ml/m AORTIC VALVE             PULMONIC VALVE LVOT Vmax:   99.27 cm/s  PR End Diast Vel: 6.15 msec LVOT Vmean:  66.800 cm/s LVOT VTI:    0.234 m  AORTA Ao Root diam: 4.20 cm Ao Asc diam:  3.90 cm Ao Desc diam: 2.10 cm  MITRAL VALVE               TRICUSPID VALVE MV Area (PHT): 3.51 cm    TR Peak grad:   22.1 mmHg MV Decel Time: 216 msec    TR Vmax:        235.00 cm/s MV E velocity: 98.17 cm/s MV A velocity: 85.33 cm/s  SHUNTS MV E/A ratio:   1.15        Systemic VTI:  0.23 m Systemic Diam: 2.30 cm  Phillip Leiter MD Electronically signed by Phillip Leiter MD Signature Date/Time: 10/05/2022/11:58:13 AM    Final   TEE  ECHO INTRAOPERATIVE TEE 10/11/2020  Narrative *INTRAOPERATIVE TRANSESOPHAGEAL REPORT *    Patient Name:   Phillip Terry     Date of Exam: 10/11/2020 Medical Rec #:  [id number]      Height:       72.0 in Accession #:    [id number]     Weight:       240.7 lb Date of Birth:  24-Aug-1945      BSA:          2.31 m Patient Age:    75 years       BP:           160/70 mmHg Patient Gender: M              HR:           70 bpm. Exam Location:  Anesthesiology  Transesophogeal exam was perform intraoperatively during surgical procedure. Patient was closely monitored under general anesthesia during the entirety of examination.  Indications:     I25.110 Atherosclerotic heart disease of native coronary artery with unstable angina pectoris Sonographer:     Lyle Marc RDCS Performing Phys: Debby Like MD Diagnosing Phys: Debby Like MD  Complications: No known complications during this procedure. POST-OP IMPRESSIONS _ Left Ventricle: The left ventricle is  unchanged from pre-bypass. _ Right Ventricle: The right ventricle appears unchanged from pre-bypass. _ Aorta: The aorta appears unchanged from pre-bypass. _ Left Atrium: The left atrium appears unchanged from pre-bypass. _ Left Atrial Appendage: The left atrial appendage appears unchanged from pre-bypass. _ Aortic Valve: The aortic valve appears unchanged from pre-bypass. _ Mitral Valve: The mitral valve appears unchanged from pre-bypass. _ Tricuspid Valve: The tricuspid valve appears unchanged from pre-bypass. _ Pulmonic Valve: The pulmonic valve appears unchanged from pre-bypass. _ Interatrial Septum: The interatrial septum appears unchanged from pre-bypass. _ Interventricular Septum: The interventricular septum appears unchanged from pre-bypass. _  Pericardium: The pericardium appears unchanged from pre-bypass.  PRE-OP FINDINGS Left Ventricle: The left ventricle has normal systolic function, with an ejection fraction of 60-65%. The cavity size was normal. There is no left ventricular hypertrophy.   Right Ventricle: The right ventricle has normal systolic function. The cavity was normal. There is no increase in right ventricular wall thickness.  Left Atrium: Left atrial size was normal in size. No left atrial/left atrial appendage thrombus was detected. There is continuous echo contrast seen in the left atrial cavity.  Right Atrium: Right atrial size was normal in size. Prominent Chiari network.  Interatrial Septum: No atrial level shunt detected by color flow Doppler.  Pericardium: There is no evidence of pericardial effusion.  Mitral Valve: The mitral valve is normal in structure. Mitral valve regurgitation is trivial by color flow Doppler. There is No evidence of mitral stenosis.  Tricuspid Valve: The tricuspid valve was normal in structure. Tricuspid valve regurgitation is trivial by color flow Doppler. No evidence of tricuspid stenosis is present.  Aortic Valve: The aortic valve is tricuspid Aortic valve regurgitation was not visualized by color flow Doppler. There is no stenosis of the aortic valve.   Pulmonic Valve: The pulmonic valve was not assessed. Pulmonic valve regurgitation was not assessed by color flow Doppler.    Debby Like MD Electronically signed by Debby Like MD Signature Date/Time: 10/11/2020/5:15:53 PM    Final  MONITORS  LONG TERM MONITOR (3-14 DAYS) 12/06/2020  Narrative Patch Wear Time:  3 days and 0 hours (2022-10-14T19:56:03-398 to 2022-10-17T19:58:35-0400)  Patient had a min HR of 47 bpm, max HR of 115 bpm, and avg HR of 62 bpm. Predominant underlying rhythm was Sinus Rhythm. 2 Supraventricular Tachycardia runs occurred, the run with the fastest interval lasting 4 beats with a max rate  of 115 bpm, the longest lasting 5 beats with an avg rate of 100 bpm. Isolated SVEs were rare (<1.0%), SVE Couplets were rare (<1.0%), and SVE Triplets were rare (<1.0%). Isolated VEs were rare (<1.0%), VE Couplets were rare (<1.0%), and no VE Triplets were present.  Ventricular ectopy was rare Supraventricular ectopy is rare no episodes of atrial fibrillation or flutter.  There were 2 brief episodes of atrial tachycardia longest 5 beats and duration heart rate of 100 bpm.  There were no triggered or diary events  Conclusion unremarkable 3-day event monitor   CT SCANS  CT CORONARY MORPH W/CTA COR W/SCORE 09/13/2020  Addendum 09/13/2020  7:03 PM ADDENDUM REPORT: 09/13/2020 19:01  EXAM: OVER-READ INTERPRETATION  CT CHEST  The following report is an over-read performed by radiologist Dr. Isla Qua [facility] on 09/13/2020. This over-read does not include interpretation of cardiac or coronary anatomy or pathology. The coronary CT a and coronary calcium  interpretation by the cardiologist is attached. Chest with limited coverage through the heart including the mid chest only.  COMPARISON:  Chest CT  from September 2021  FINDINGS: Cardiovascular: Please see dedicated report from cardiology for further detail regarding cardiac findings.  Mediastinum/Nodes: No adenopathy in the visible mediastinum or hila. Esophagus grossly unremarkable with respect to visualized portions.  Lungs/Pleura: Mild subpleural reticulation bilaterally. Potential interstitial lung disease as outlined in prior CT imaging. No effusion. No consolidative changes. Airways, visible airways are patent.  Upper Abdomen: Incidental imaging of upper abdominal contents with signs of hepatic steatosis, moderate to marked. No acute upper abdominal process on very limited assessment.  Musculoskeletal: Spinal degenerative changes. No acute or destructive bone process.  IMPRESSION: 1. Suspicion for  interstitial lung disease as outlined in prior CT imaging. 2. Hepatic steatosis.   Electronically Signed By: Isla Blind M.D. On: 09/13/2020 19:01  Narrative CLINICAL DATA:  78 year old with angina.  EXAM: Cardiac/Coronary  CTA  TECHNIQUE: The patient was scanned on a [employer].  FINDINGS: A 130 kV prospective scan was triggered in the descending thoracic aorta at 111 HU's. Axial non-contrast 3 mm slices were carried out through the heart. The data set was analyzed on a dedicated work station and scored using the Agatson method. Gantry rotation speed was 250 msecs and collimation was .6 mm. Beta blockade and 0.8 mg of sl NTG was given. The 3D data set was reconstructed in 5% intervals of the 67-82 % of the R-R cycle. Diastolic phases were analyzed on a dedicated work station using MPR, MIP and VRT modes. The patient received 80 cc of contrast.  Poor image quality: Misregistration artifact significant.  Aorta:  Normal size.  Aortic atherosclerosis.  Aortic Valve: No calcifications.  Coronary Arteries:  Normal coronary origin.  Right dominance.  RCA is a large dominant artery that gives rise to PDA and PLA. There is significant calcified plaque (Unable to interpret given severe misregistration).  Left main is a large calcified artery that gives rise to LAD and LCX arteries. Unable to interpret given severe misregistration at the level of left main.  LAD - diffuse calcified plaque. Unable to interpret lumen given severe misregistration  LCX is a calcified vessel. Unable to interpret given severe misregistration.  Other findings:  Normal pulmonary vein drainage into the left atrium.  Normal left atrial appendage without a thrombus.  Normal size of the pulmonary artery.  Air from injection noted in apex of right ventricle.  Please see radiology report for non cardiac findings.  IMPRESSION: 1. Coronary calcium  score of 2605. This was 48  percentile for age and sex matched control.  2. Normal coronary origin with right dominance.  3. Severe multivessel coronary plaque. Unable to interpret lumen given severe misregistration. Recommend cardiac catheterization for further clarification.  Electronically Signed: By: Oneil Parchment MD On: 09/13/2020 11:38     ______________________________________________________________________________________________      EKG Interpretation Date/Time:  Tuesday December 24 2023 08:59:46 EST Ventricular Rate:  55 PR Interval:  188 QRS Duration:  152 QT Interval:  476 QTC Calculation: 455 R Axis:   -44  Text Interpretation: Sinus bradycardia Left axis deviation Right bundle branch block with repolarization abnormality Minimal voltage criteria for LVH, may be normal variant ( R in aVL ) When compared with ECG of 25-Mar-2023 15:07, Premature supraventricular complexes are no longer Present Right bundle branch block is now Present Confirmed by Monetta Rogue ([id number]) on 12/24/2023 9:14:57 AM  EKG Interpretation Date/Time:  Tuesday December 24 2023 08:59:46 EST Ventricular Rate:  55 PR Interval:  188 QRS Duration:  152 QT Interval:  476  QTC Calculation: 455 R Axis:   -44  Text Interpretation: Sinus bradycardia Left axis deviation Right bundle branch block with repolarization abnormality Minimal voltage criteria for LVH, may be normal variant ( R in aVL ) When compared with ECG of 25-Mar-2023 15:07, Premature supraventricular complexes are no longer Present Right bundle branch block is now Present Confirmed by Monetta Rogue ([id number]) on 12/24/2023 9:14:57 AM   Recent Labs: 05/06/2023: ALT 20; BUN 16; Creatinine, Ser 1.07; Potassium 4.8; Sodium 139  Recent Lipid Panel    Component Value Date/Time   CHOL 118 05/06/2023 0821   TRIG 139 05/06/2023 0821   HDL 31 (L) 05/06/2023 0821   CHOLHDL 3.8 05/06/2023 0821   CHOLHDL 2.6 10/09/2020 0325   VLDL 23 10/09/2020 0325   LDLCALC 62 05/06/2023  0821    Physical Exam:    VS:  BP (!) 146/72   Pulse (!) 55   Ht 6' (1.829 m)   Wt 238 lb 6.4 oz (108.1 kg)   SpO2 96%   BMI 32.33 kg/m     Wt Readings from Last 3 Encounters:  12/24/23 238 lb 6.4 oz (108.1 kg)  05/20/23 229 lb 6.4 oz (104.1 kg)  03/25/23 233 lb (105.7 kg)     GEN: Quite distressed about his wife's illness well nourished, well developed in no acute distress HEENT: Normal NECK: No JVD; No carotid bruits LYMPHATICS: No lymphadenopathy CARDIAC: RRR, no murmurs, rubs, gallops RESPIRATORY:  Clear to auscultation without rales, wheezing or rhonchi  ABDOMEN: Soft, non-tender, non-distended MUSCULOSKELETAL:  No edema; No deformity  SKIN: Warm and dry NEUROLOGIC:  Alert and oriented x 3 PSYCHIATRIC:  Normal affect    Signed, Rogue Monetta, MD  12/24/2023 10:49 AM    [facility]

## 2023-12-24 ENCOUNTER — Ambulatory Visit: Attending: Cardiology | Admitting: Cardiology

## 2023-12-24 VITALS — BP 146/72 | HR 55 | Ht 72.0 in | Wt 238.4 lb

## 2023-12-24 DIAGNOSIS — M25579 Pain in unspecified ankle and joints of unspecified foot: Secondary | ICD-10-CM | POA: Insufficient documentation

## 2023-12-24 DIAGNOSIS — J8410 Pulmonary fibrosis, unspecified: Secondary | ICD-10-CM | POA: Diagnosis not present

## 2023-12-24 DIAGNOSIS — I25118 Atherosclerotic heart disease of native coronary artery with other forms of angina pectoris: Secondary | ICD-10-CM

## 2023-12-24 DIAGNOSIS — I10 Essential (primary) hypertension: Secondary | ICD-10-CM

## 2023-12-24 DIAGNOSIS — I4510 Unspecified right bundle-branch block: Secondary | ICD-10-CM

## 2023-12-24 DIAGNOSIS — R0602 Shortness of breath: Secondary | ICD-10-CM | POA: Diagnosis not present

## 2023-12-24 DIAGNOSIS — R209 Unspecified disturbances of skin sensation: Secondary | ICD-10-CM | POA: Insufficient documentation

## 2023-12-24 DIAGNOSIS — Z951 Presence of aortocoronary bypass graft: Secondary | ICD-10-CM | POA: Diagnosis not present

## 2023-12-24 DIAGNOSIS — E782 Mixed hyperlipidemia: Secondary | ICD-10-CM

## 2023-12-24 HISTORY — DX: Pain in unspecified ankle and joints of unspecified foot: M25.579

## 2023-12-24 HISTORY — DX: Unspecified disturbances of skin sensation: R20.9

## 2023-12-24 NOTE — Patient Instructions (Signed)
 Medication Instructions:  Your physician recommends that you continue on your current medications as directed. Please refer to the Current Medication list given to you today.  *If you need a refill on your cardiac medications before your next appointment, please call your pharmacy*  Lab Work: None If you have labs (blood work) drawn today and your tests are completely normal, you will receive your results only by: MyChart Message (if you have MyChart) OR A paper copy in the mail If you have any lab test that is abnormal or we need to change your treatment, we will call you to review the results.  Testing/Procedures:   [facility] [address] [place], [place] [address] Phone:  [phone]    Please arrive 15 minutes prior to your appointment time for registration and insurance purposes.  The test will take approximately 3 to 4 hours to complete; you may bring reading material.  If someone comes with you to your appointment, they will need to remain in the main lobby due to limited space in the testing area. **If you are pregnant or breastfeeding, please notify the nuclear lab prior to your appointment**  How to prepare for your Myocardial Perfusion Test: Do not eat or drink 3 hours prior to your test, except you may have water. Do not consume products containing caffeine (regular or decaffeinated) 12 hours prior to your test. (ex: coffee, chocolate, sodas, tea). Do bring a list of your current medications with you.  If not listed below, you may take your medications as normal. Do not take metoprolol  (Lopressor , Toprol ) for 24 hours prior to the test.  Bring the medication to your appointment as you may be required to take it once the test is complete. Do wear comfortable clothes (no dresses or overalls) and walking shoes, tennis shoes preferred (No heels or open toe shoes are allowed). Do NOT wear cologne, perfume, aftershave, or lotions (deodorant is  allowed). If these instructions are not followed, your test will have to be rescheduled.  Please report to [address] for your test.  If you have questions or concerns about your appointment, you can call the [facility] at [phone].  If you cannot keep your appointment, please provide 24 hours notification to the Nuclear Lab, to avoid a possible $50 charge to your account.   Follow-Up: At [facility], you and your health needs are our priority.  As part of our continuing mission to provide you with exceptional heart care, our providers are all part of one team.  This team includes your primary Cardiologist (physician) and Advanced Practice Providers or APPs (Physician Assistants and Nurse Practitioners) who all work together to provide you with the care you need, when you need it.  Your next appointment:   6 month(s)  Provider:   Redell Leiter, MD    We recommend signing up for the patient portal called MyChart.  Sign up information is provided on this After Visit Summary.  MyChart is used to connect with patients for Virtual Visits (Telemedicine).  Patients are able to view lab/test results, encounter notes, upcoming appointments, etc.  Non-urgent messages can be sent to your provider as well.   To learn more about what you can do with MyChart, go to [email or web address].   Other Instructions None

## 2023-12-25 ENCOUNTER — Telehealth: Payer: Self-pay | Admitting: *Deleted

## 2023-12-25 NOTE — Telephone Encounter (Signed)
 Spoke to [facility] who is the patient's wife. I gave her instructions about the STRESS TEST on 01/01/24 at 7:45. Should the patient have any questions he can call the office.

## 2024-01-01 ENCOUNTER — Ambulatory Visit: Attending: Cardiology

## 2024-01-01 ENCOUNTER — Telehealth: Payer: Self-pay

## 2024-01-01 DIAGNOSIS — I4510 Unspecified right bundle-branch block: Secondary | ICD-10-CM | POA: Diagnosis not present

## 2024-01-01 DIAGNOSIS — E782 Mixed hyperlipidemia: Secondary | ICD-10-CM

## 2024-01-01 DIAGNOSIS — R0602 Shortness of breath: Secondary | ICD-10-CM | POA: Diagnosis not present

## 2024-01-01 DIAGNOSIS — I25118 Atherosclerotic heart disease of native coronary artery with other forms of angina pectoris: Secondary | ICD-10-CM

## 2024-01-01 DIAGNOSIS — J8410 Pulmonary fibrosis, unspecified: Secondary | ICD-10-CM | POA: Diagnosis not present

## 2024-01-01 DIAGNOSIS — I10 Essential (primary) hypertension: Secondary | ICD-10-CM

## 2024-01-01 DIAGNOSIS — Z951 Presence of aortocoronary bypass graft: Secondary | ICD-10-CM | POA: Diagnosis not present

## 2024-01-01 LAB — MYOCARDIAL PERFUSION IMAGING
LV dias vol: 171 mL (ref 62–150)
LV sys vol: 62 mL (ref 4.2–5.8)
Nuc Stress EF: 64 %
Peak HR: 72 {beats}/min
Rest HR: 10 {beats}/min
Rest Nuclear Isotope Dose: 10.1 mCi
SDS: 4
SRS: 4
SSS: 8
Stress Nuclear Isotope Dose: 30.5 mCi
TID: 1.54

## 2024-01-01 MED ORDER — TECHNETIUM TC 99M TETROFOSMIN IV KIT
10.1000 | PACK | Freq: Once | INTRAVENOUS | Status: AC | PRN
  Administered 2024-01-01: 10.1 via INTRAVENOUS

## 2024-01-01 MED ORDER — TECHNETIUM TC 99M TETROFOSMIN IV KIT
30.5000 | PACK | Freq: Once | INTRAVENOUS | Status: AC | PRN
  Administered 2024-01-01: 30.5 via INTRAVENOUS

## 2024-01-01 MED ORDER — NITROGLYCERIN 0.4 MG SL SUBL: 0.4000 mg | SUBLINGUAL_TABLET | SUBLINGUAL | 6 refills | Status: AC | PRN

## 2024-01-01 MED ORDER — ASPIRIN 81 MG PO TBEC: 81.0000 mg | DELAYED_RELEASE_TABLET | Freq: Every day | ORAL | Status: AC

## 2024-01-01 MED ORDER — REGADENOSON 0.4 MG/5ML IV SOLN
0.4000 mg | Freq: Once | INTRAVENOUS | Status: AC
  Administered 2024-01-01: 0.4 mg via INTRAVENOUS

## 2024-01-01 NOTE — Telephone Encounter (Signed)
 Pt aware that stress test is abnormal. Appointment made with Dr. Edwyna 01/01/24. Pt aware to start 81 mg coated aspirin  daily and use NTG for chest pain.  Pt verbalized understanding and had no additional questions.

## 2024-01-02 ENCOUNTER — Encounter: Payer: Self-pay | Admitting: Cardiology

## 2024-01-02 ENCOUNTER — Ambulatory Visit: Attending: Cardiology | Admitting: Cardiology

## 2024-01-02 VITALS — BP 150/80 | HR 63 | Ht 72.0 in | Wt 237.0 lb

## 2024-01-02 DIAGNOSIS — E782 Mixed hyperlipidemia: Secondary | ICD-10-CM | POA: Diagnosis not present

## 2024-01-02 DIAGNOSIS — R9439 Abnormal result of other cardiovascular function study: Secondary | ICD-10-CM | POA: Insufficient documentation

## 2024-01-02 DIAGNOSIS — R0609 Other forms of dyspnea: Secondary | ICD-10-CM | POA: Diagnosis not present

## 2024-01-02 DIAGNOSIS — I2510 Atherosclerotic heart disease of native coronary artery without angina pectoris: Secondary | ICD-10-CM | POA: Diagnosis not present

## 2024-01-02 DIAGNOSIS — G4733 Obstructive sleep apnea (adult) (pediatric): Secondary | ICD-10-CM

## 2024-01-02 DIAGNOSIS — I10 Essential (primary) hypertension: Secondary | ICD-10-CM

## 2024-01-02 DIAGNOSIS — J8410 Pulmonary fibrosis, unspecified: Secondary | ICD-10-CM

## 2024-01-02 DIAGNOSIS — Z951 Presence of aortocoronary bypass graft: Secondary | ICD-10-CM

## 2024-01-02 MED ORDER — ROSUVASTATIN CALCIUM 20 MG PO TABS: 20.0000 mg | ORAL_TABLET | Freq: Every day | ORAL | 3 refills | Status: AC

## 2024-01-02 NOTE — Patient Instructions (Signed)
 Medication Instructions:   STOP: Pravachol   START: Rosuvastatin  20mg  1 tablet daily  HOLD: Amlodipine -Valsartan- Day of procedure     *If you need a refill on your cardiac medications before your next appointment, please call your pharmacy*   Lab Work: BMP, CBC- today  If you have labs (blood work) drawn today and your tests are completely normal, you will receive your results only by: MyChart Message (if you have MyChart) OR A paper copy in the mail If you have any lab test that is abnormal or we need to change your treatment, we will call you to review the results.   Testing/Procedures:  [place] A DEPT OF . [facility] AT [place] [address] [place] [address] Dept: [phone] Loc: [phone]  Phillip Terry  01/02/2024  You are scheduled for a Cardiac Catheterization on Wednesday, November 26 with Dr. Peter Jordan.  1. Please arrive at the [facility] (Main Entrance A) at [facility]: [address] [place], [place] [address] at 6:30 AM (This time is 2 hour(s) before your procedure to ensure your preparation).   Free valet parking service is available. You will check in at ADMITTING. The support person will be asked to wait in the waiting room.  It is OK to have someone drop you off and come back when you are ready to be discharged.    Special note: Every effort is made to have your procedure done on time. Please understand that emergencies sometimes delay scheduled procedures.  2. Diet: Nothing to eat after midnight.   3. Hydration: You need to be well hydrated before your procedure. On November 26, you may drink approved liquids (see below) until 2 hours before the procedure, with 16 oz of water as your last intake.   List of approved liquids water, clear juice, clear tea, black coffee, fruit juices, non-citric and without pulp, carbonated beverages, Gatorade, Kool -Aid, plain Jello-O and plain  ice popsicles.  4. Labs: You will need to have blood drawn on , November 20 at [facility]: [address] [place] . You do not need to be fasting.  5. Medication instructions in preparation for your procedure:   Contrast Allergy: No   *For reference purposes while preparing patient instructions.   Delete this med list prior to printing instructions for patient.*    Stop taking, Diovan (Valsartan) Wednesday, November 26,      On the morning of your procedure, take your Aspirin  81 mg and any morning medicines NOT listed above.  You may use sips of water.  6. Plan to go home the same day, you will only stay overnight if medically necessary. 7. Bring a current list of your medications and current insurance cards. 8. You MUST have a responsible person to drive you home. 9. Someone MUST be with you the first 24 hours after you arrive home or your discharge will be delayed. 10. Please wear clothes that are easy to get on and off and wear slip-on shoes.  Thank you for allowing us  to care for you!   -- [facility]    Follow-Up: At [facility], you and your health needs are our priority.  As part of our continuing mission to provide you with exceptional heart care, we have created designated Provider Care Teams.  These Care Teams include your primary Cardiologist (physician) and Advanced Practice Providers (APPs -  Physician Assistants and Nurse Practitioners) who all  work together to provide you with the care you need, when you need it.  We recommend signing up for the patient portal called MyChart.  Sign up information is provided on this After Visit Summary.  MyChart is used to connect with patients for Virtual Visits (Telemedicine).  Patients are able to view lab/test results, encounter notes, upcoming appointments, etc.  Non-urgent messages can be sent to your provider as well.   To learn more about what you can do with MyChart, go to  [email or web address].    Your next appointment:   1 month(s)  The format for your next appointment:   In Person  Provider:   Redell Leiter, MD   Other Instructions  Coronary Angiogram With Stent Coronary angiogram with stent placement is a procedure to widen or open a narrow blood vessel of the heart (coronary artery). Arteries may become blocked by cholesterol buildup (plaques) in the lining of the artery wall. When a coronary artery becomes partially blocked, blood flow to that area decreases. This may lead to chest pain or a heart attack (myocardial infarction). A stent is a small piece of metal that looks like mesh or spring. Stent placement may be done as treatment after a heart attack, or to prevent a heart attack if a blocked artery is found by a coronary angiogram. Let your health care provider know about: Any allergies you have, including allergies to medicines or contrast dye. All medicines you are taking, including vitamins, herbs, eye drops, creams, and over-the-counter medicines. Any problems you or family members have had with anesthetic medicines. Any blood disorders you have. Any surgeries you have had. Any medical conditions you have, including kidney problems or kidney failure. Whether you are pregnant or may be pregnant. Whether you are breastfeeding. What are the risks? Generally, this is a safe procedure. However, serious problems may occur, including: Damage to nearby structures or organs, such as the heart, blood vessels, or kidneys. A return of blockage. Bleeding, infection, or bruising at the insertion site. A collection of blood under the skin (hematoma) at the insertion site. A blood clot in another part of the body. Allergic reaction to medicines or dyes. Bleeding into the abdomen (retroperitoneal bleeding). Stroke (rare). Heart attack (rare). What happens before the procedure? Staying hydrated Follow instructions from your health care provider  about hydration, which may include: Up to 2 hours before the procedure - you may continue to drink clear liquids, such as water , clear fruit juice, black coffee, and plain tea.    Eating and drinking restrictions Follow instructions from your health care provider about eating and drinking, which may include: 8 hours before the procedure - stop eating heavy meals or foods, such as meat, fried foods, or fatty foods. 6 hours before the procedure - stop eating light meals or foods, such as toast or cereal. 2 hours before the procedure - stop drinking clear liquids. Medicines Ask your health care provider about: Changing or stopping your regular medicines. This is especially important if you are taking diabetes medicines or blood thinners. Taking medicines such as aspirin  and ibuprofen. These medicines can thin your blood. Do not take these medicines unless your health care provider tells you to take them. Generally, aspirin  is recommended before a thin tube, called a catheter, is passed through a blood vessel and inserted into the heart (cardiac catheterization). Taking over-the-counter medicines, vitamins, herbs, and supplements. General instructions Do not use any products that contain nicotine or tobacco for at least 4 weeks before  the procedure. These products include cigarettes, e-cigarettes, and chewing tobacco. If you need help quitting, ask your health care provider. Plan to have someone take you home from the hospital or clinic. If you will be going home right after the procedure, plan to have someone with you for 24 hours. You may have tests and imaging procedures. Ask your health care provider: How your insertion site will be marked. Ask which artery will be used for the procedure. What steps will be taken to help prevent infection. These may include: Removing hair at the insertion site. Washing skin with a germ-killing soap. Taking antibiotic medicine. What happens during the  procedure? An IV will be inserted into one of your veins. Electrodes may be placed on your chest to monitor your heart rate during the procedure. You will be given one or more of the following: A medicine to help you relax (sedative). A medicine to numb the area (local anesthetic) for catheter insertion. A small incision will be made for catheter insertion. The catheter will be inserted into an artery using a guide wire. The location may be in your groin, your wrist, or the fold of your arm (near your elbow). An X-ray procedure (fluoroscopy) will be used to help guide the catheter to the opening of the heart arteries. A dye will be injected into the catheter. X-rays will be taken. The dye helps to show where any narrowing or blockages are located in the arteries. Tell your health care provider if you have chest pain or trouble breathing. A tiny wire will be guided to the blocked spot, and a balloon will be inflated to make the artery wider. The stent will be expanded to crush the plaques into the wall of the vessel. The stent will hold the area open and improve the blood flow. Most stents have a drug coating to reduce the risk of the stent narrowing over time. The artery may be made wider using a drill, laser, or other tools that remove plaques. The catheter will be removed when the blood flow improves. The stent will stay where it was placed, and the lining of the artery will grow over it. A bandage (dressing) will be placed on the insertion site. Pressure will be applied to stop bleeding. The IV will be removed. This procedure may vary among health care providers and hospitals.    What happens after the procedure? Your blood pressure, heart rate, breathing rate, and blood oxygen level will be monitored until you leave the hospital or clinic. If the procedure is done through the leg, you will lie flat in bed for a few hours or for as long as told by your health care provider. You will be  instructed not to bend or cross your legs. The insertion site and the pulse in your foot or wrist will be checked often. You may have more blood tests, X-rays, and a test that records the electrical activity of your heart (electrocardiogram, or ECG). Do not drive for 24 hours if you were given a sedative during your procedure. Summary Coronary angiogram with stent placement is a procedure to widen or open a narrowed coronary artery. This is done to treat heart problems. Before the procedure, let your health care provider know about all the medical conditions and surgeries you have or have had. This is a safe procedure. However, some problems may occur, including damage to nearby structures or organs, bleeding, blood clots, or allergies. Follow your health care provider's instructions about eating, drinking,  medicines, and other lifestyle changes, such as quitting tobacco use before the procedure. This information is not intended to replace advice given to you by your health care provider. Make sure you discuss any questions you have with your health care provider. Document Revised: 08/20/2018 Document Reviewed: 08/20/2018 Elsevier Patient Education  2021 [employer].

## 2024-01-02 NOTE — Progress Notes (Signed)
 Cardiology Office Note:    Date:  01/02/2024   ID:  Phillip Terry, DOB [birth year], MRN [id number]  PCP:  Phillip Vicenta BRAVO, MD  Cardiologist:  Phillip JONELLE Crape, MD   Referring MD: Phillip Vicenta BRAVO, MD    ASSESSMENT:    1. Coronary artery disease involving native coronary artery of native heart, unspecified whether angina present   2. OSA on CPAP   3. Pulmonary fibrosis (HCC)   4. DOE (dyspnea on exertion)   5. S/P CABG x 3   6. Mixed hyperlipidemia   7. Abnormal nuclear stress test    PLAN:    In order of problems listed above:  Coronary artery disease: Dyspnea on exertion: Abnormal nuclear stress test: Following recommendations were made to the patient.I discussed coronary angiography and left heart catheterization with the patient at extensive length. Procedure, benefits and potential risks were explained. Patient had multiple questions which were answered to the patient's satisfaction. Patient agreed and consented for the procedure. Further recommendations will be made based on the findings of the coronary angiography. In the interim. The patient has any significant symptoms he knows to go to the nearest emergency room. Essential hypertension: Blood pressure is stable and diet was emphasized.  His blood pressure is mildly elevated and he is anxious about the results of the test and the procedure.  His daughter mentions to me that his blood pressures are stable at home. Mixed dyslipidemia: On lipid-lowering medications.  I discussed with him about switching to statins which are more potent and guideline directed and he is agreeable.  Will switch to rosuvastatin  20 mg daily.  He will be back in 6 weeks for liver lipid check. Pulmonary fibrosis: Followed by his primary care and pulmonology. Obesity: Weight reduction stressed diet emphasized and he promises to do better.  Risks of obesity explained. He will be seen in follow-up appointment by Dr. Monetta after coronary  angiography.   Medication Adjustments/Labs and Tests Ordered: Current medicines are reviewed at length with the patient today.  Concerns regarding medicines are outlined above.  No orders of the defined types were placed in this encounter.  No orders of the defined types were placed in this encounter.    Chief Complaint  Patient presents with   Follow-up     History of Present Illness:    Phillip Terry is a 78 y.o. male.  Patient has past medical history of coronary artery disease post CABG surgery, essential hypertension, mixed dyslipidemia, obesity and dyspnea on exertion.  He has pulmonary fibrosis.  He underwent stress testing which revealed abnormal reversibility in the inferior segment.  He is here for follow-up with his daughter.  At the time of my evaluation, the patient is alert awake oriented and in no distress.  Past Medical History:  Diagnosis Date   Atherosclerotic heart disease of native coronary artery with unspecified angina pectoris 05/04/2021   CAD (coronary artery disease)    CAD in native artery    Calculus of kidney    Chest pain 08/22/2020   DOE (dyspnea on exertion) 10/27/2019   Encounter for fitting and adjustment of hearing aid 05/04/2021   Encounter for other administrative examinations 05/04/2021   Essential hypertension    Gout 05/04/2021   Hearing loss 05/04/2021   Hepatic steatosis    History of asbestos exposure 05/04/2021   Hyperglycemia 10/07/2020   Hyperlipidemia    Impacted cerumen, bilateral 03/12/2023   Obesity 05/04/2021   OSA on CPAP 03/16/2007   RDI 39  Sat 89%   Pain in joint involving ankle and foot 12/24/2023   Peripheral neuropathy    Pulmonary fibrosis (HCC) 10/27/2019   S/P CABG x 3 10/14/2020   Sensorineural hearing loss, bilateral 05/04/2021   Skin sensation disturbance 12/24/2023   Unstable angina (HCC) 10/07/2020    Past Surgical History:  Procedure Laterality Date   CATARACT EXTRACTION Bilateral    CORONARY ARTERY  BYPASS GRAFT N/A 10/11/2020   Procedure: CORONARY ARTERY BYPASS GRAFTING (CABG), ON PUMP, TIMES THREE, USING LEFT INTERNAL MAMMARY ARTERY AND RIGHT ENDOSCOPICALLY HARVESTED GREATER SAPHENOUS VEIN;  Surgeon: Shyrl Linnie KIDD, MD;  Location: [facility] OR;  Service: Open Heart Surgery;  Laterality: N/A;   FOOT SURGERY Left 2012   gastronemius slide procedure   LEFT HEART CATH AND CORONARY ANGIOGRAPHY N/A 09/23/2020   Procedure: LEFT HEART CATH AND CORONARY ANGIOGRAPHY;  Surgeon: Dann Candyce RAMAN, MD;  Location: [facility] INVASIVE CV LAB;  Service: Cardiovascular;  Laterality: N/A;   LEG SURGERY Left 2012   Gastronemius slide procedure   TEE WITHOUT CARDIOVERSION N/A 10/11/2020   Procedure: TRANSESOPHAGEAL ECHOCARDIOGRAM (TEE);  Surgeon: Shyrl Linnie KIDD, MD;  Location: [facility] OR;  Service: Open Heart Surgery;  Laterality: N/A;   TONSILLECTOMY      Current Medications: Current Meds  Medication Sig   amLODipine -valsartan (EXFORGE) 10-320 MG tablet Take 1 tablet by mouth daily.   aspirin  EC 81 MG tablet Take 1 tablet (81 mg total) by mouth daily. Swallow whole.   febuxostat  (ULORIC ) 40 MG tablet Take 40 mg by mouth at bedtime.   fenofibrate  160 MG tablet Take 160 mg by mouth at bedtime.   metoprolol  tartrate (LOPRESSOR ) 50 MG tablet Take 50 mg by mouth in the morning and at bedtime.   nitroGLYCERIN  (NITROSTAT ) 0.4 MG SL tablet Place 0.4 mg under the tongue every 5 (five) minutes x 3 doses as needed for chest pain.   nitroGLYCERIN  (NITROSTAT ) 0.4 MG SL tablet Place 1 tablet (0.4 mg total) under the tongue every 5 (five) minutes as needed.   pravastatin  (PRAVACHOL ) 40 MG tablet Take 1 tablet (40 mg total) by mouth every evening.     Allergies:   Promethazine  and Crestor  [rosuvastatin ]   Social History   Socioeconomic History   Marital status: Married    Spouse name: Not on file   Number of children: Not on file   Years of education: Not on file   Highest education level: Not on file  Occupational  History   Not on file  Tobacco Use   Smoking status: Never    Passive exposure: Never   Smokeless tobacco: Never  Vaping Use   Vaping status: Never Used  Substance and Sexual Activity   Alcohol use: Never   Drug use: Never   Sexual activity: Not on file  Other Topics Concern   Not on file  Social History Narrative   Not on file   Social Drivers of Health   Financial Resource Strain: Not on file  Food Insecurity: Not on file  Transportation Needs: Not on file  Physical Activity: Not on file  Stress: Not on file  Social Connections: Unknown (06/26/2021)   [facility] from [facility]   Social Network    Social Network: Not on file     Family History: The patient's family history includes Breast cancer in his mother; CAD in his father; Fibromyalgia in his sister.  ROS:   Please see the history of present illness.    All other systems reviewed and  are negative.  EKGs/Labs/Other Studies Reviewed:    The following studies were reviewed today: I discussed my findings with the patient at length    Recent Labs: 05/06/2023: ALT 20; BUN 16; Creatinine, Ser 1.07; Potassium 4.8; Sodium 139  Recent Lipid Panel    Component Value Date/Time   CHOL 118 05/06/2023 0821   TRIG 139 05/06/2023 0821   HDL 31 (L) 05/06/2023 0821   CHOLHDL 3.8 05/06/2023 0821   CHOLHDL 2.6 10/09/2020 0325   VLDL 23 10/09/2020 0325   LDLCALC 62 05/06/2023 0821    Physical Exam:    VS:  BP (!) 150/80   Pulse 63   Ht 6' (1.829 m)   Wt 237 lb (107.5 kg)   SpO2 97%   BMI 32.14 kg/m     Wt Readings from Last 3 Encounters:  01/02/24 237 lb (107.5 kg)  01/01/24 238 lb (108 kg)  12/24/23 238 lb 6.4 oz (108.1 kg)     GEN: Patient is in no acute distress HEENT: Normal NECK: No JVD; No carotid bruits LYMPHATICS: No lymphadenopathy CARDIAC: Hear sounds regular, 2/6 systolic murmur at the apex. RESPIRATORY:  Clear to auscultation without rales, wheezing or rhonchi  ABDOMEN: Soft, non-tender,  non-distended MUSCULOSKELETAL:  No edema; No deformity  SKIN: Warm and dry NEUROLOGIC:  Alert and oriented x 3 PSYCHIATRIC:  Normal affect   Signed, Phillip JONELLE Crape, MD  01/02/2024 10:24 AM    [facility]

## 2024-01-02 NOTE — H&P (View-Only) (Signed)
 Cardiology Office Note:    Date:  01/02/2024   ID:  Phillip Terry, DOB [birth year], MRN [id number]  PCP:  Keren Vicenta BRAVO, MD  Cardiologist:  Jennifer JONELLE Crape, MD   Referring MD: Keren Vicenta BRAVO, MD    ASSESSMENT:    1. Coronary artery disease involving native coronary artery of native heart, unspecified whether angina present   2. OSA on CPAP   3. Pulmonary fibrosis (HCC)   4. DOE (dyspnea on exertion)   5. S/P CABG x 3   6. Mixed hyperlipidemia   7. Abnormal nuclear stress test    PLAN:    In order of problems listed above:  Coronary artery disease: Dyspnea on exertion: Abnormal nuclear stress test: Following recommendations were made to the patient.I discussed coronary angiography and left heart catheterization with the patient at extensive length. Procedure, benefits and potential risks were explained. Patient had multiple questions which were answered to the patient's satisfaction. Patient agreed and consented for the procedure. Further recommendations will be made based on the findings of the coronary angiography. In the interim. The patient has any significant symptoms he knows to go to the nearest emergency room. Essential hypertension: Blood pressure is stable and diet was emphasized.  His blood pressure is mildly elevated and he is anxious about the results of the test and the procedure.  His daughter mentions to me that his blood pressures are stable at home. Mixed dyslipidemia: On lipid-lowering medications.  I discussed with him about switching to statins which are more potent and guideline directed and he is agreeable.  Will switch to rosuvastatin  20 mg daily.  He will be back in 6 weeks for liver lipid check. Pulmonary fibrosis: Followed by his primary care and pulmonology. Obesity: Weight reduction stressed diet emphasized and he promises to do better.  Risks of obesity explained. He will be seen in follow-up appointment by Dr. Monetta after coronary  angiography.   Medication Adjustments/Labs and Tests Ordered: Current medicines are reviewed at length with the patient today.  Concerns regarding medicines are outlined above.  No orders of the defined types were placed in this encounter.  No orders of the defined types were placed in this encounter.    Chief Complaint  Patient presents with   Follow-up     History of Present Illness:    Phillip Terry is a 78 y.o. male.  Patient has past medical history of coronary artery disease post CABG surgery, essential hypertension, mixed dyslipidemia, obesity and dyspnea on exertion.  He has pulmonary fibrosis.  He underwent stress testing which revealed abnormal reversibility in the inferior segment.  He is here for follow-up with his daughter.  At the time of my evaluation, the patient is alert awake oriented and in no distress.  Past Medical History:  Diagnosis Date   Atherosclerotic heart disease of native coronary artery with unspecified angina pectoris 05/04/2021   CAD (coronary artery disease)    CAD in native artery    Calculus of kidney    Chest pain 08/22/2020   DOE (dyspnea on exertion) 10/27/2019   Encounter for fitting and adjustment of hearing aid 05/04/2021   Encounter for other administrative examinations 05/04/2021   Essential hypertension    Gout 05/04/2021   Hearing loss 05/04/2021   Hepatic steatosis    History of asbestos exposure 05/04/2021   Hyperglycemia 10/07/2020   Hyperlipidemia    Impacted cerumen, bilateral 03/12/2023   Obesity 05/04/2021   OSA on CPAP 03/16/2007   RDI 39  Sat 89%   Pain in joint involving ankle and foot 12/24/2023   Peripheral neuropathy    Pulmonary fibrosis (HCC) 10/27/2019   S/P CABG x 3 10/14/2020   Sensorineural hearing loss, bilateral 05/04/2021   Skin sensation disturbance 12/24/2023   Unstable angina (HCC) 10/07/2020    Past Surgical History:  Procedure Laterality Date   CATARACT EXTRACTION Bilateral    CORONARY ARTERY  BYPASS GRAFT N/A 10/11/2020   Procedure: CORONARY ARTERY BYPASS GRAFTING (CABG), ON PUMP, TIMES THREE, USING LEFT INTERNAL MAMMARY ARTERY AND RIGHT ENDOSCOPICALLY HARVESTED GREATER SAPHENOUS VEIN;  Surgeon: Shyrl Linnie KIDD, MD;  Location: [facility] OR;  Service: Open Heart Surgery;  Laterality: N/A;   FOOT SURGERY Left 2012   gastronemius slide procedure   LEFT HEART CATH AND CORONARY ANGIOGRAPHY N/A 09/23/2020   Procedure: LEFT HEART CATH AND CORONARY ANGIOGRAPHY;  Surgeon: Dann Candyce RAMAN, MD;  Location: [facility] INVASIVE CV LAB;  Service: Cardiovascular;  Laterality: N/A;   LEG SURGERY Left 2012   Gastronemius slide procedure   TEE WITHOUT CARDIOVERSION N/A 10/11/2020   Procedure: TRANSESOPHAGEAL ECHOCARDIOGRAM (TEE);  Surgeon: Shyrl Linnie KIDD, MD;  Location: [facility] OR;  Service: Open Heart Surgery;  Laterality: N/A;   TONSILLECTOMY      Current Medications: Current Meds  Medication Sig   amLODipine -valsartan (EXFORGE) 10-320 MG tablet Take 1 tablet by mouth daily.   aspirin  EC 81 MG tablet Take 1 tablet (81 mg total) by mouth daily. Swallow whole.   febuxostat  (ULORIC ) 40 MG tablet Take 40 mg by mouth at bedtime.   fenofibrate  160 MG tablet Take 160 mg by mouth at bedtime.   metoprolol  tartrate (LOPRESSOR ) 50 MG tablet Take 50 mg by mouth in the morning and at bedtime.   nitroGLYCERIN  (NITROSTAT ) 0.4 MG SL tablet Place 0.4 mg under the tongue every 5 (five) minutes x 3 doses as needed for chest pain.   nitroGLYCERIN  (NITROSTAT ) 0.4 MG SL tablet Place 1 tablet (0.4 mg total) under the tongue every 5 (five) minutes as needed.   pravastatin  (PRAVACHOL ) 40 MG tablet Take 1 tablet (40 mg total) by mouth every evening.     Allergies:   Promethazine  and Crestor  [rosuvastatin ]   Social History   Socioeconomic History   Marital status: Married    Spouse name: Not on file   Number of children: Not on file   Years of education: Not on file   Highest education level: Not on file  Occupational  History   Not on file  Tobacco Use   Smoking status: Never    Passive exposure: Never   Smokeless tobacco: Never  Vaping Use   Vaping status: Never Used  Substance and Sexual Activity   Alcohol use: Never   Drug use: Never   Sexual activity: Not on file  Other Topics Concern   Not on file  Social History Narrative   Not on file   Social Drivers of Health   Financial Resource Strain: Not on file  Food Insecurity: Not on file  Transportation Needs: Not on file  Physical Activity: Not on file  Stress: Not on file  Social Connections: Unknown (06/26/2021)   [facility] from [facility]   Social Network    Social Network: Not on file     Family History: The patient's family history includes Breast cancer in his mother; CAD in his father; Fibromyalgia in his sister.  ROS:   Please see the history of present illness.    All other systems reviewed and  are negative.  EKGs/Labs/Other Studies Reviewed:    The following studies were reviewed today: I discussed my findings with the patient at length    Recent Labs: 05/06/2023: ALT 20; BUN 16; Creatinine, Ser 1.07; Potassium 4.8; Sodium 139  Recent Lipid Panel    Component Value Date/Time   CHOL 118 05/06/2023 0821   TRIG 139 05/06/2023 0821   HDL 31 (L) 05/06/2023 0821   CHOLHDL 3.8 05/06/2023 0821   CHOLHDL 2.6 10/09/2020 0325   VLDL 23 10/09/2020 0325   LDLCALC 62 05/06/2023 0821    Physical Exam:    VS:  BP (!) 150/80   Pulse 63   Ht 6' (1.829 m)   Wt 237 lb (107.5 kg)   SpO2 97%   BMI 32.14 kg/m     Wt Readings from Last 3 Encounters:  01/02/24 237 lb (107.5 kg)  01/01/24 238 lb (108 kg)  12/24/23 238 lb 6.4 oz (108.1 kg)     GEN: Patient is in no acute distress HEENT: Normal NECK: No JVD; No carotid bruits LYMPHATICS: No lymphadenopathy CARDIAC: Hear sounds regular, 2/6 systolic murmur at the apex. RESPIRATORY:  Clear to auscultation without rales, wheezing or rhonchi  ABDOMEN: Soft, non-tender,  non-distended MUSCULOSKELETAL:  No edema; No deformity  SKIN: Warm and dry NEUROLOGIC:  Alert and oriented x 3 PSYCHIATRIC:  Normal affect   Signed, Jennifer JONELLE Crape, MD  01/02/2024 10:24 AM    [facility]

## 2024-01-03 ENCOUNTER — Ambulatory Visit: Payer: Self-pay | Admitting: Cardiology

## 2024-01-03 LAB — BASIC METABOLIC PANEL WITH GFR
BUN/Creatinine Ratio: 14 (ref 10–24)
BUN: 14 mg/dL (ref 8–27)
CO2: 21 mmol/L (ref 20–29)
Calcium: 9.9 mg/dL (ref 8.6–10.2)
Chloride: 103 mmol/L (ref 96–106)
Creatinine, Ser: 1.03 mg/dL (ref 0.76–1.27)
Glucose: 86 mg/dL (ref 70–99)
Potassium: 4.4 mmol/L (ref 3.5–5.2)
Sodium: 139 mmol/L (ref 134–144)
eGFR: 75 mL/min/1.73 (ref >59)

## 2024-01-03 LAB — CBC
Hematocrit: 44.9 % (ref 37.5–51.0)
Hemoglobin: 14.8 g/dL (ref 13.0–17.7)
MCH: 30.1 pg (ref 26.6–33.0)
MCHC: 33 g/dL (ref 31.5–35.7)
MCV: 91 fL (ref 79–97)
Platelets: 157 x10E3/uL (ref 150–450)
RBC: 4.91 x10E6/uL (ref 4.14–5.80)
RDW: 12.8 % (ref 11.6–15.4)
WBC: 6.5 x10E3/uL (ref 3.4–10.8)

## 2024-01-06 ENCOUNTER — Ambulatory Visit: Payer: Self-pay

## 2024-01-06 NOTE — Telephone Encounter (Signed)
Phillip Terry returning call.

## 2024-01-07 ENCOUNTER — Telehealth: Payer: Self-pay

## 2024-01-07 NOTE — Telephone Encounter (Signed)
 S/w Hope and she stated the procedure: CROWN TO BE PLACED ON FRONT TOOTH

## 2024-01-07 NOTE — Telephone Encounter (Signed)
   Pre-operative Risk Assessment    Patient Name: Phillip Terry  DOB: 1945-08-23 MRN: [id number]   Date of last office visit: 01/02/2024 Date of next office visit: 02/19/2024  Request for Surgical Clearance    Procedure:  Restoration   Date of Surgery:  Clearance TBD                                Surgeon:  Dr. Feliciano Molt Surgeon's Group or Practice Name:  [facility] Phone number:  [phone] Fax number:  [phone]   Type of Clearance Requested:   - Medical    Type of Anesthesia:  Local    Additional requests/questions:  Does this patient need antibiotics?  [name]   01/07/2024, 10:30 AM

## 2024-01-08 ENCOUNTER — Ambulatory Visit (HOSPITAL_COMMUNITY)
Admission: RE | Admit: 2024-01-08 | Discharge: 2024-01-08 | Disposition: A | Discharge status: Home / Self Care | Attending: Cardiology | Admitting: Cardiology

## 2024-01-08 ENCOUNTER — Other Ambulatory Visit: Payer: Self-pay

## 2024-01-08 ENCOUNTER — Encounter (HOSPITAL_COMMUNITY)
Admission: RE | Disposition: A | Discharge status: Home / Self Care | Payer: Self-pay | Source: Home / Self Care | Attending: Cardiology

## 2024-01-08 DIAGNOSIS — I2510 Atherosclerotic heart disease of native coronary artery without angina pectoris: Secondary | ICD-10-CM | POA: Diagnosis not present

## 2024-01-08 DIAGNOSIS — E669 Obesity, unspecified: Secondary | ICD-10-CM | POA: Insufficient documentation

## 2024-01-08 DIAGNOSIS — J8410 Pulmonary fibrosis, unspecified: Secondary | ICD-10-CM | POA: Insufficient documentation

## 2024-01-08 DIAGNOSIS — G4733 Obstructive sleep apnea (adult) (pediatric): Secondary | ICD-10-CM | POA: Insufficient documentation

## 2024-01-08 DIAGNOSIS — I10 Essential (primary) hypertension: Secondary | ICD-10-CM | POA: Insufficient documentation

## 2024-01-08 DIAGNOSIS — I2582 Chronic total occlusion of coronary artery: Secondary | ICD-10-CM | POA: Insufficient documentation

## 2024-01-08 DIAGNOSIS — R0609 Other forms of dyspnea: Secondary | ICD-10-CM | POA: Insufficient documentation

## 2024-01-08 DIAGNOSIS — Z7982 Long term (current) use of aspirin: Secondary | ICD-10-CM | POA: Diagnosis not present

## 2024-01-08 DIAGNOSIS — Z79899 Other long term (current) drug therapy: Secondary | ICD-10-CM | POA: Insufficient documentation

## 2024-01-08 DIAGNOSIS — Z951 Presence of aortocoronary bypass graft: Secondary | ICD-10-CM | POA: Insufficient documentation

## 2024-01-08 DIAGNOSIS — I2584 Coronary atherosclerosis due to calcified coronary lesion: Secondary | ICD-10-CM | POA: Diagnosis not present

## 2024-01-08 DIAGNOSIS — R9439 Abnormal result of other cardiovascular function study: Secondary | ICD-10-CM

## 2024-01-08 DIAGNOSIS — Z6832 Body mass index (BMI) 32.0-32.9, adult: Secondary | ICD-10-CM | POA: Diagnosis not present

## 2024-01-08 DIAGNOSIS — E782 Mixed hyperlipidemia: Secondary | ICD-10-CM | POA: Insufficient documentation

## 2024-01-08 HISTORY — PX: LEFT HEART CATH AND CORS/GRAFTS ANGIOGRAPHY: CATH118250

## 2024-01-08 SURGERY — LEFT HEART CATH AND CORS/GRAFTS ANGIOGRAPHY
Anesthesia: LOCAL

## 2024-01-08 MED ORDER — ACETAMINOPHEN 325 MG PO TABS: 650.0000 mg | ORAL_TABLET | ORAL | Status: DC | PRN

## 2024-01-08 MED ORDER — VERAPAMIL HCL 2.5 MG/ML IV SOLN
INTRAVENOUS | Status: DC | PRN
  Administered 2024-01-08: 10 mL via INTRA_ARTERIAL

## 2024-01-08 MED ORDER — LIDOCAINE HCL (PF) 1 % IJ SOLN
INTRAMUSCULAR | Status: AC
Start: 2024-01-08 — End: 2024-01-08
  Filled 2024-01-08: qty 30

## 2024-01-08 MED ORDER — FENTANYL CITRATE (PF) 100 MCG/2ML IJ SOLN
INTRAMUSCULAR | Status: AC
  Filled 2024-01-08: qty 2

## 2024-01-08 MED ORDER — MIDAZOLAM HCL 2 MG/2ML IJ SOLN
INTRAMUSCULAR | Status: AC
  Filled 2024-01-08: qty 2

## 2024-01-08 MED ORDER — HEPARIN (PORCINE) IN NACL 1000-0.9 UT/500ML-% IV SOLN
INTRAVENOUS | Status: DC | PRN
  Administered 2024-01-08: 1000 mL

## 2024-01-08 MED ORDER — SODIUM CHLORIDE 0.9% FLUSH: 3.0000 mL | Freq: Two times a day (BID) | INTRAVENOUS | Status: DC

## 2024-01-08 MED ORDER — IOHEXOL 350 MG/ML SOLN
INTRAVENOUS | Status: DC | PRN
  Administered 2024-01-08: 98 mL

## 2024-01-08 MED ORDER — HEPARIN SODIUM (PORCINE) 1000 UNIT/ML IJ SOLN
INTRAMUSCULAR | Status: DC | PRN
  Administered 2024-01-08: 5000 [IU] via INTRAVENOUS

## 2024-01-08 MED ORDER — ASPIRIN 81 MG PO CHEW: 81.0000 mg | CHEWABLE_TABLET | ORAL | Status: DC

## 2024-01-08 MED ORDER — SODIUM CHLORIDE 0.9% FLUSH: 3.0000 mL | INTRAVENOUS | Status: DC | PRN

## 2024-01-08 MED ORDER — SODIUM CHLORIDE 0.9 % IV SOLN: 250.0000 mL | INTRAVENOUS | Status: DC | PRN

## 2024-01-08 MED ORDER — LIDOCAINE HCL (PF) 1 % IJ SOLN
INTRAMUSCULAR | Status: DC | PRN
  Administered 2024-01-08: 2 mL via INTRADERMAL

## 2024-01-08 MED ORDER — ONDANSETRON HCL 4 MG/2ML IJ SOLN: 4.0000 mg | Freq: Four times a day (QID) | INTRAMUSCULAR | Status: DC | PRN

## 2024-01-08 MED ORDER — FREE WATER: 500.0000 mL | Freq: Once | Status: DC

## 2024-01-08 MED ORDER — MIDAZOLAM HCL (PF) 2 MG/2ML IJ SOLN
INTRAMUSCULAR | Status: DC | PRN
  Administered 2024-01-08: 1 mg via INTRAVENOUS

## 2024-01-08 MED ORDER — VERAPAMIL HCL 2.5 MG/ML IV SOLN
INTRAVENOUS | Status: AC
Start: 2024-01-08 — End: 2024-01-08
  Filled 2024-01-08: qty 2

## 2024-01-08 MED ORDER — SODIUM CHLORIDE 0.9 % IV SOLN
INTRAVENOUS | Status: DC | PRN
  Administered 2024-01-08: 10 mL/h via INTRAVENOUS

## 2024-01-08 MED ORDER — FENTANYL CITRATE (PF) 100 MCG/2ML IJ SOLN
INTRAMUSCULAR | Status: DC | PRN
  Administered 2024-01-08: 25 ug via INTRAVENOUS

## 2024-01-08 MED ORDER — HEPARIN SODIUM (PORCINE) 1000 UNIT/ML IJ SOLN
INTRAMUSCULAR | Status: AC
  Filled 2024-01-08: qty 10

## 2024-01-08 SURGICAL SUPPLY — 10 items
CATH 5FR JL3.5 JR4 ANG PIG MP (CATHETERS) IMPLANT
CATH INFINITI 5 FR IM (CATHETERS) IMPLANT
DEVICE RAD COMP TR BAND LRG (VASCULAR PRODUCTS) IMPLANT
GLIDESHEATH SLEND SS 6F .021 (SHEATH) IMPLANT
GUIDEWIRE INQWIRE 1.5J.035X260 (WIRE) IMPLANT
KIT SYRINGE INJ CVI SPIKEX1 (MISCELLANEOUS) IMPLANT
PACK CARDIAC CATHETERIZATION (CUSTOM PROCEDURE TRAY) ×1 IMPLANT
SET ATX-X65L (MISCELLANEOUS) IMPLANT
SHEATH GLIDE SLENDER 4/5FR (SHEATH) IMPLANT
SHEATH PROBE COVER 6X72 (BAG) IMPLANT

## 2024-01-08 NOTE — Discharge Instructions (Signed)

## 2024-01-08 NOTE — Progress Notes (Signed)
 TR band removed at 1200, gauze dressing applied. Left radial level 0, clean, dry, and intact.

## 2024-01-08 NOTE — Interval H&P Note (Signed)
 History and Physical Interval Note:  01/08/2024 7:36 AM  Phillip Terry  has presented today for surgery, with the diagnosis of abnormal stress test.  The various methods of treatment have been discussed with the patient and family. After consideration of risks, benefits and other options for treatment, the patient has consented to  Procedure(s): LEFT HEART CATH AND CORS/GRAFTS ANGIOGRAPHY (N/A) as a surgical intervention.  The patient's history has been reviewed, patient examined, no change in status, stable for surgery.  I have reviewed the patient's chart and labs.  Questions were answered to the patient's satisfaction.   Cath Lab Visit (complete for each Cath Lab visit)  Clinical Evaluation Leading to the Procedure:   ACS: No.  Non-ACS:    Anginal Classification: CCS II  Anti-ischemic medical therapy: Maximal Therapy (2 or more classes of medications)  Non-Invasive Test Results: Intermediate-risk stress test findings: cardiac mortality 1-3%/year  Prior CABG: Previous CABG        Phillip [facility] 01/08/2024 7:36 AM

## 2024-01-09 ENCOUNTER — Encounter (HOSPITAL_COMMUNITY): Payer: Self-pay | Admitting: Cardiology

## 2024-01-13 DIAGNOSIS — L821 Other seborrheic keratosis: Secondary | ICD-10-CM | POA: Diagnosis not present

## 2024-01-13 DIAGNOSIS — L578 Other skin changes due to chronic exposure to nonionizing radiation: Secondary | ICD-10-CM | POA: Diagnosis not present

## 2024-01-13 NOTE — Telephone Encounter (Signed)
   Patient Name: Phillip Terry  DOB: [birth year] MRN: [id number]  Primary Cardiologist: Redell Leiter, MD  Chart reviewed as part of pre-operative protocol coverage.    Simple dental extractions (i.e. 1-2 teeth) and crowns are considered low risk procedures per guidelines and generally do not require any specific cardiac clearance. It is also generally accepted that for simple extractions, crowns and dental cleanings, there is no need to interrupt blood thinner therapy.  SBE prophylaxis is not required for the patient from a cardiac standpoint.  I will route this recommendation to the requesting party via Epic fax function and remove from pre-op pool.  Please call with questions.  Lamarr Satterfield, NP 01/13/2024, 4:24 PM

## 2024-01-17 ENCOUNTER — Ambulatory Visit: Admitting: Cardiology

## 2024-01-27 DIAGNOSIS — G8929 Other chronic pain: Secondary | ICD-10-CM | POA: Insufficient documentation

## 2024-01-27 NOTE — Progress Notes (Unsigned)
 Cardiology Office Note:    Date:  01/28/2024   ID:  Phillip Terry, DOB [birth year], MRN [id number]  PCP:  Keren Vicenta BRAVO, MD  Cardiologist:  Redell Leiter, MD    Referring MD: Keren Vicenta BRAVO, MD    ASSESSMENT:    1. Coronary artery disease involving native coronary artery of native heart, unspecified whether angina present   2. S/P CABG x 3   3. Mixed hyperlipidemia   4. Pulmonary fibrosis (HCC)    PLAN:    In order of problems listed above:  Continued shortness of breath but the mechanism appears to be a combination of his lung disease and deconditioning He will start a exercise walking program and if not improving will refer to cardiac rehabilitation as part of his problem is microvascular anginal equivalent Continue medical therapy with aspirin  beta-blocker antihypertensive and lipid-lowering with rosuvastatin  and fibric acid derivative He is due for labs in January with his PCP   Next appointment: I will plan to see him in 1 year my office   Medication Adjustments/Labs and Tests Ordered: Current medicines are reviewed at length with the patient today.  Concerns regarding medicines are outlined above.  No orders of the defined types were placed in this encounter.  No orders of the defined types were placed in this encounter.    History of Present Illness:    Phillip Terry is a 78 y.o. male with a hx of CAD with CABG August 2022 for multivessel CAD pulmonary fibrosis hypertension hyperlipidemia and paroxysmal atrial fibrillation last seen By Dr. Edwyna on several occasions in the office and by me 12/24/2023.  His complaint was exertional shortness of breath and underwent a myocardial perfusion study showed normal LVEF and function EF 64% and an inferior ischemic defect.  He underwent recent left heart catheterization 01/08/2024 that showed the presence of a patent left thoracic artery to the LAD vein graft to the diagonal vein graft to the PDA and there is no target  for percutaneous intervention.    Compliance with diet, lifestyle and medications: Yes  He is quite reassured by his coronary angiogram with patent grafts and no need for further intervention. He attributes his exercise intolerance and shortness of breath to his sedentary lifestyle as he cares for his wife at home who is quite ill but she is fortunately beginning to improve Offered him referral to cardiac rehabilitation he prefers to start a walking program on his own and will contact me if he is not making progress. He has had no angina palpitation syncope or edema no orthopnea but has or exertional shortness of breath Past Medical History:  Diagnosis Date   Abnormal nuclear stress test 01/02/2024   Atherosclerotic heart disease of native coronary artery with unspecified angina pectoris 05/04/2021   CAD (coronary artery disease)    CAD in native artery    Calculus of kidney    Chest pain 08/22/2020   Chronic heel pain 01/27/2024   DOE (dyspnea on exertion) 10/27/2019   Encounter for fitting and adjustment of hearing aid 05/04/2021   Encounter for other administrative examinations 05/04/2021   Essential hypertension    Gout 05/04/2021   Hearing loss 05/04/2021   Hepatic steatosis    History of asbestos exposure 05/04/2021   Hyperglycemia 10/07/2020   Hyperlipidemia    Impacted cerumen, bilateral 03/12/2023   Obesity 05/04/2021   OSA on CPAP 03/16/2007   RDI 39 Sat 89%   Pain in joint involving ankle and foot 12/24/2023  Peripheral neuropathy    Pulmonary fibrosis (HCC) 10/27/2019   S/P CABG x 3 10/14/2020   Sensorineural hearing loss, bilateral 05/04/2021   Skin sensation disturbance 12/24/2023   Unstable angina (HCC) 10/07/2020    Current Medications: Active Medications[1]    EKGs/Labs/Other Studies Reviewed:    The following studies were reviewed today:  Cardiac Studies & Procedures    ______________________________________________________________________________________________ CARDIAC CATHETERIZATION  CARDIAC CATHETERIZATION 01/08/2024  Conclusion   Mid LAD lesion is 75% stenosed.   Dist Cx lesion is 50% stenosed.   Prox RCA to Mid RCA lesion is 60% stenosed.   2nd Diag lesion is 80% stenosed.   RPAV lesion is 50% stenosed.   Prox LAD lesion is 100% stenosed.   LIMA graft was visualized by angiography and is normal in caliber.   SVG graft was visualized by angiography and is normal in caliber.   SVG graft was visualized by angiography and is normal in caliber.   The graft exhibits no disease.   The graft exhibits no disease.   The graft exhibits no disease.   LV end diastolic pressure is mildly elevated.  2 vessel obstructive CAD Patent LIMA to the LAD Patent SVG to diagonal Patent SVG to PDA Mildly elevated LVEDP 18 mm Hg  Plan: no obstruction to explain findings on Myoview . Perfusion looks good. Continue medical therapy.  Findings Coronary Findings Diagnostic  Dominance: Right  Left Anterior Descending The vessel exhibits minimal luminal irregularities. Prox LAD lesion is 100% stenosed. The lesion is severely calcified. Mid LAD lesion is 75% stenosed. The lesion is severely calcified.  Second Diagonal Branch Vessel is small in size. 2nd Diag lesion is 80% stenosed.  Left Circumflex Dist Cx lesion is 50% stenosed.  Right Coronary Artery Prox RCA to Mid RCA lesion is 60% stenosed.  Right Posterior Atrioventricular Artery RPAV lesion is 50% stenosed.  LIMA LIMA Graft To Dist LAD LIMA graft was visualized by angiography and is normal in caliber.  The graft exhibits no disease.  Saphenous Graft To 1st Diag SVG graft was visualized by angiography and is normal in caliber.  The graft exhibits no disease.  Saphenous Graft To RPDA SVG graft was visualized by angiography and is normal in caliber.  The graft exhibits no  disease.  Intervention  No interventions have been documented.   CARDIAC CATHETERIZATION  CARDIAC CATHETERIZATION 09/23/2020  Conclusion   Prox RCA to Mid RCA lesion is 60-70% stenosed.   Dist Cx lesion is 50% stenosed.   Mid LAD lesion is 75% stenosed.  This is a long segment of disease starting immediately after the large diagonal branch.   Prox LAD lesion is 90% stenosed.   2nd Diag lesion is 80% stenosed.  This is a small vessel which likely could be treated medically.   RPAV lesion is 50% stenosed.   The left ventricular systolic function is normal.   LV end diastolic pressure is normal.   The left ventricular ejection fraction is 55-65% by visual estimate.   There is no aortic valve stenosis.  Severe, calcific disease most notably in the proximal and mid LAD.  The long segment of disease in the mid LAD involves several diagonal vessel ostia.  Moderate disease in the RCA.  We will plan for cardiac surgery referral for consideration of robotic mid CAB procedure with LIMA to LAD.  I suspect his symptoms would resolve with just that revascularization.  If symptoms persisted, the RCA is certainly treatable percutaneously and a hybrid approach to his  revascularization could be considered.  Severe tortuosity of the right subclavian.  If intervention was needed, would consider using long destination sheath from the right radial to engage the RCA.  Engaging the left main was much easier.  Results discussed with the patient's daughter.  Echocardiogram will be obtained.  Referral made to Dr. Shyrl.  Findings Coronary Findings Diagnostic  Dominance: Right  Left Anterior Descending The vessel exhibits minimal luminal irregularities. Prox LAD lesion is 90% stenosed. The lesion is severely calcified. Mid LAD lesion is 75% stenosed. The lesion is severely calcified.  Second Diagonal Branch Vessel is small in size. 2nd Diag lesion is 80% stenosed.  Left Circumflex Dist Cx lesion  is 50% stenosed.  Right Coronary Artery Prox RCA to Mid RCA lesion is 60% stenosed.  Right Posterior Atrioventricular Artery RPAV lesion is 50% stenosed.  Intervention  No interventions have been documented.   STRESS TESTS  MYOCARDIAL PERFUSION IMAGING 01/01/2024  Interpretation Summary   Findings are consistent with ischemia. The study is intermediate risk.   LV perfusion is abnormal. There is evidence of ischemia. Defect 1: There is a medium defect with moderate reduction in uptake present in the inferior location(s) that is reversible. There is normal wall motion in the defect area. Consistent with ischemia.   Left ventricular function is normal. Nuclear stress EF: 64%. The left ventricular ejection fraction is normal (55-65%). End diastolic cavity size is normal.   ECHOCARDIOGRAM  ECHOCARDIOGRAM COMPLETE 10/05/2022  Narrative ECHOCARDIOGRAM REPORT    Patient Name:   ELIAB CLOSSON Date of Exam: 10/05/2022 Medical Rec #:  [id number]  Height:       72.0 in Accession #:    [id number] Weight:       242.4 lb Date of Birth:  03/23/1945  BSA:          2.312 m Patient Age:    77 years   BP:           132/66 mmHg Patient Gender: M          HR:           62 bpm. Exam Location:  [facility]  Procedure: 2D Echo, Cardiac Doppler, Color Doppler and Strain Analysis  Indications:    Coronary artery disease of native artery of native heart with stable angina pectoris (HCC) [I25.118 (ICD-10-CM)]; S/P CABG x 3 [Z95.1 (ICD-10-CM)]; Essential hypertension [I10 (ICD-10-CM)]; Mixed hyperlipidemia [E78.2 (ICD-10-CM)]; PAF (paroxysmal atrial fibrillation) (HCC) [I48.0 (ICD-10-CM)]; Pulmonary fibrosis (HCC) [J84.10 (ICD-10-CM)]  History:        Patient has prior history of Echocardiogram examinations, most recent 10/07/2020. Prior CABG, Arrythmias:Atrial Fibrillation; Risk Factors:Hypertension and Dyslipidemia.  Sonographer:    Charlie Jointer RDCS Referring Phys: [id number] Vanessia Bokhari J  Julaine Zimny  IMPRESSIONS   1. Left ventricular ejection fraction, by estimation, is 55 to 60%. The left ventricle has normal function. The left ventricle has no regional wall motion abnormalities. There is mild concentric left ventricular hypertrophy. Left ventricular diastolic parameters were normal. 2. Right ventricular systolic function is normal. The right ventricular size is mildly enlarged. There is normal pulmonary artery systolic pressure. 3. The mitral valve is normal in structure. Mild mitral valve regurgitation. No evidence of mitral stenosis. 4. The aortic valve is tricuspid. Aortic valve regurgitation is not visualized. No aortic stenosis is present. 5. Aortic Normal DTA. There is moderate dilatation of the ascending aorta and of the aortic root, measuring 40 mm. 6. The inferior vena cava is normal in size with greater than 50% respiratory variability,  suggesting right atrial pressure of 3 mmHg.  FINDINGS Left Ventricle: Left ventricular ejection fraction, by estimation, is 55 to 60%. The left ventricle has normal function. The left ventricle has no regional wall motion abnormalities. Global longitudinal strain performed but not reported based on interpreter judgement due to suboptimal tracking. The left ventricular internal cavity size was normal in size. There is mild concentric left ventricular hypertrophy. Left ventricular diastolic parameters were normal. Indeterminate filling pressures.  Right Ventricle: The right ventricular size is mildly enlarged. No increase in right ventricular wall thickness. Right ventricular systolic function is normal. There is normal pulmonary artery systolic pressure. The tricuspid regurgitant velocity is 2.35 m/s, and with an assumed right atrial pressure of 8 mmHg, the estimated right ventricular systolic pressure is 30.1 mmHg.  Left Atrium: Left atrial size was normal in size.  Right Atrium: Right atrial size was normal in size.  Pericardium: There  is no evidence of pericardial effusion.  Mitral Valve: The mitral valve is normal in structure. Mild mitral valve regurgitation. No evidence of mitral valve stenosis.  Tricuspid Valve: The tricuspid valve is normal in structure. Tricuspid valve regurgitation is mild . No evidence of tricuspid stenosis.  Aortic Valve: The aortic valve is tricuspid. Aortic valve regurgitation is not visualized. No aortic stenosis is present.  Pulmonic Valve: The pulmonic valve was normal in structure. Pulmonic valve regurgitation is not visualized. No evidence of pulmonic stenosis.  Aorta: The aortic arch was not well visualized, the aortic root is normal in size and structure and Normal DTA. There is moderate dilatation of the ascending aorta and of the aortic root, measuring 40 mm.  Venous: A normal flow pattern is recorded from the right upper pulmonary vein. The inferior vena cava is normal in size with greater than 50% respiratory variability, suggesting right atrial pressure of 3 mmHg.  IAS/Shunts: No atrial level shunt detected by color flow Doppler.   LEFT VENTRICLE PLAX 2D LVIDd:         4.70 cm   Diastology LVIDs:         3.40 cm   LV e' medial:   6.09 cm/s LV PW:         1.20 cm   LV E/e' medial: 16.1 LV IVS:        1.20 cm LVOT diam:     2.30 cm LV SV:         97 LV SV Index:   42 LVOT Area:     4.15 cm   RIGHT VENTRICLE            IVC RV Basal diam:  4.50 cm    IVC diam: 2.30 cm RV Mid diam:    4.20 cm RV S prime:     8.49 cm/s TAPSE (M-mode): 2.0 cm  LEFT ATRIUM             Index        RIGHT ATRIUM           Index LA diam:        4.70 cm 2.03 cm/m   RA Area:     16.40 cm LA Vol (A2C):   58.5 ml 25.30 ml/m  RA Volume:   38.70 ml  16.74 ml/m LA Vol (A4C):   60.0 ml 25.95 ml/m LA Biplane Vol: 60.8 ml 26.30 ml/m AORTIC VALVE             PULMONIC VALVE LVOT Vmax:   99.27 cm/s  PR End Diast Vel:  6.15 msec LVOT Vmean:  66.800 cm/s LVOT VTI:    0.234 m  AORTA Ao Root diam:  4.20 cm Ao Asc diam:  3.90 cm Ao Desc diam: 2.10 cm  MITRAL VALVE               TRICUSPID VALVE MV Area (PHT): 3.51 cm    TR Peak grad:   22.1 mmHg MV Decel Time: 216 msec    TR Vmax:        235.00 cm/s MV E velocity: 98.17 cm/s MV A velocity: 85.33 cm/s  SHUNTS MV E/A ratio:  1.15        Systemic VTI:  0.23 m Systemic Diam: 2.30 cm  Redell Leiter MD Electronically signed by Redell Leiter MD Signature Date/Time: 10/05/2022/11:58:13 AM    Final   TEE  ECHO INTRAOPERATIVE TEE 10/11/2020  Narrative *INTRAOPERATIVE TRANSESOPHAGEAL REPORT *    Patient Name:   KHYLAN SAWYER     Date of Exam: 10/11/2020 Medical Rec #:  [id number]      Height:       72.0 in Accession #:    [id number]     Weight:       240.7 lb Date of Birth:  August 07, 1945      BSA:          2.31 m Patient Age:    75 years       BP:           160/70 mmHg Patient Gender: M              HR:           70 bpm. Exam Location:  Anesthesiology  Transesophogeal exam was perform intraoperatively during surgical procedure. Patient was closely monitored under general anesthesia during the entirety of examination.  Indications:     I25.110 Atherosclerotic heart disease of native coronary artery with unstable angina pectoris Sonographer:     Lyle Marc RDCS Performing Phys: Debby Like MD Diagnosing Phys: Debby Like MD  Complications: No known complications during this procedure. POST-OP IMPRESSIONS _ Left Ventricle: The left ventricle is unchanged from pre-bypass. _ Right Ventricle: The right ventricle appears unchanged from pre-bypass. _ Aorta: The aorta appears unchanged from pre-bypass. _ Left Atrium: The left atrium appears unchanged from pre-bypass. _ Left Atrial Appendage: The left atrial appendage appears unchanged from pre-bypass. _ Aortic Valve: The aortic valve appears unchanged from pre-bypass. _ Mitral Valve: The mitral valve appears unchanged from pre-bypass. _ Tricuspid Valve: The tricuspid valve  appears unchanged from pre-bypass. _ Pulmonic Valve: The pulmonic valve appears unchanged from pre-bypass. _ Interatrial Septum: The interatrial septum appears unchanged from pre-bypass. _ Interventricular Septum: The interventricular septum appears unchanged from pre-bypass. _ Pericardium: The pericardium appears unchanged from pre-bypass.  PRE-OP FINDINGS Left Ventricle: The left ventricle has normal systolic function, with an ejection fraction of 60-65%. The cavity size was normal. There is no left ventricular hypertrophy.   Right Ventricle: The right ventricle has normal systolic function. The cavity was normal. There is no increase in right ventricular wall thickness.  Left Atrium: Left atrial size was normal in size. No left atrial/left atrial appendage thrombus was detected. There is continuous echo contrast seen in the left atrial cavity.  Right Atrium: Right atrial size was normal in size. Prominent Chiari network.  Interatrial Septum: No atrial level shunt detected by color flow Doppler.  Pericardium: There is no evidence of pericardial effusion.  Mitral Valve: The mitral valve is normal in structure. Mitral  valve regurgitation is trivial by color flow Doppler. There is No evidence of mitral stenosis.  Tricuspid Valve: The tricuspid valve was normal in structure. Tricuspid valve regurgitation is trivial by color flow Doppler. No evidence of tricuspid stenosis is present.  Aortic Valve: The aortic valve is tricuspid Aortic valve regurgitation was not visualized by color flow Doppler. There is no stenosis of the aortic valve.   Pulmonic Valve: The pulmonic valve was not assessed. Pulmonic valve regurgitation was not assessed by color flow Doppler.    Debby Like MD Electronically signed by Debby Like MD Signature Date/Time: 10/11/2020/5:15:53 PM    Final  MONITORS  LONG TERM MONITOR (3-14 DAYS) 12/06/2020  Narrative Patch Wear Time:  3 days and 0 hours  (2022-10-14T19:56:03-398 to 2022-10-17T19:58:35-0400)  Patient had a min HR of 47 bpm, max HR of 115 bpm, and avg HR of 62 bpm. Predominant underlying rhythm was Sinus Rhythm. 2 Supraventricular Tachycardia runs occurred, the run with the fastest interval lasting 4 beats with a max rate of 115 bpm, the longest lasting 5 beats with an avg rate of 100 bpm. Isolated SVEs were rare (<1.0%), SVE Couplets were rare (<1.0%), and SVE Triplets were rare (<1.0%). Isolated VEs were rare (<1.0%), VE Couplets were rare (<1.0%), and no VE Triplets were present.  Ventricular ectopy was rare Supraventricular ectopy is rare no episodes of atrial fibrillation or flutter.  There were 2 brief episodes of atrial tachycardia longest 5 beats and duration heart rate of 100 bpm.  There were no triggered or diary events  Conclusion unremarkable 3-day event monitor   CT SCANS  CT CORONARY MORPH W/CTA COR W/SCORE 09/13/2020  Addendum 09/13/2020  7:03 PM ADDENDUM REPORT: 09/13/2020 19:01  EXAM: OVER-READ INTERPRETATION  CT CHEST  The following report is an over-read performed by radiologist Dr. Isla Qua [facility] on 09/13/2020. This over-read does not include interpretation of cardiac or coronary anatomy or pathology. The coronary CT a and coronary calcium  interpretation by the cardiologist is attached. Chest with limited coverage through the heart including the mid chest only.  COMPARISON:  Chest CT from September 2021  FINDINGS: Cardiovascular: Please see dedicated report from cardiology for further detail regarding cardiac findings.  Mediastinum/Nodes: No adenopathy in the visible mediastinum or hila. Esophagus grossly unremarkable with respect to visualized portions.  Lungs/Pleura: Mild subpleural reticulation bilaterally. Potential interstitial lung disease as outlined in prior CT imaging. No effusion. No consolidative changes. Airways, visible airways are patent.  Upper Abdomen:  Incidental imaging of upper abdominal contents with signs of hepatic steatosis, moderate to marked. No acute upper abdominal process on very limited assessment.  Musculoskeletal: Spinal degenerative changes. No acute or destructive bone process.  IMPRESSION: 1. Suspicion for interstitial lung disease as outlined in prior CT imaging. 2. Hepatic steatosis.   Electronically Signed By: Isla Blind M.D. On: 09/13/2020 19:01  Narrative CLINICAL DATA:  78 year old with angina.  EXAM: Cardiac/Coronary  CTA  TECHNIQUE: The patient was scanned on a [employer].  FINDINGS: A 130 kV prospective scan was triggered in the descending thoracic aorta at 111 HU's. Axial non-contrast 3 mm slices were carried out through the heart. The data set was analyzed on a dedicated work station and scored using the Agatson method. Gantry rotation speed was 250 msecs and collimation was .6 mm. Beta blockade and 0.8 mg of sl NTG was given. The 3D data set was reconstructed in 5% intervals of the 67-82 % of the R-R cycle. Diastolic phases  were analyzed on a dedicated work station using MPR, MIP and VRT modes. The patient received 80 cc of contrast.  Poor image quality: Misregistration artifact significant.  Aorta:  Normal size.  Aortic atherosclerosis.  Aortic Valve: No calcifications.  Coronary Arteries:  Normal coronary origin.  Right dominance.  RCA is a large dominant artery that gives rise to PDA and PLA. There is significant calcified plaque (Unable to interpret given severe misregistration).  Left main is a large calcified artery that gives rise to LAD and LCX arteries. Unable to interpret given severe misregistration at the level of left main.  LAD - diffuse calcified plaque. Unable to interpret lumen given severe misregistration  LCX is a calcified vessel. Unable to interpret given severe misregistration.  Other findings:  Normal pulmonary vein drainage into the  left atrium.  Normal left atrial appendage without a thrombus.  Normal size of the pulmonary artery.  Air from injection noted in apex of right ventricle.  Please see radiology report for non cardiac findings.  IMPRESSION: 1. Coronary calcium  score of 2605. This was 34 percentile for age and sex matched control.  2. Normal coronary origin with right dominance.  3. Severe multivessel coronary plaque. Unable to interpret lumen given severe misregistration. Recommend cardiac catheterization for further clarification.  Electronically Signed: By: Oneil Parchment MD On: 09/13/2020 11:38     ______________________________________________________________________________________________          Recent Labs: 05/06/2023: ALT 20 01/02/2024: BUN 14; Creatinine, Ser 1.03; Hemoglobin 14.8; Platelets 157; Potassium 4.4; Sodium 139  Recent Lipid Panel    Component Value Date/Time   CHOL 118 05/06/2023 0821   TRIG 139 05/06/2023 0821   HDL 31 (L) 05/06/2023 0821   CHOLHDL 3.8 05/06/2023 0821   CHOLHDL 2.6 10/09/2020 0325   VLDL 23 10/09/2020 0325   LDLCALC 62 05/06/2023 0821    Physical Exam:    VS:  BP (!) 140/64   Pulse (!) 56   Ht 6' (1.829 m)   Wt 238 lb 6.4 oz (108.1 kg)   SpO2 98%   BMI 32.33 kg/m     Wt Readings from Last 3 Encounters:  01/28/24 238 lb 6.4 oz (108.1 kg)  01/08/24 240 lb (108.9 kg)  01/02/24 237 lb (107.5 kg)     GEN:  Well nourished, well developed in no acute distress HEENT: Normal NECK: No JVD; No carotid bruits LYMPHATICS: No lymphadenopathy CARDIAC: RRR, no murmurs, rubs, gallops RESPIRATORY:  Clear to auscultation without rales, wheezing or rhonchi  ABDOMEN: Soft, non-tender, non-distended MUSCULOSKELETAL:  No edema; No deformity  SKIN: Warm and dry NEUROLOGIC:  Alert and oriented x 3 PSYCHIATRIC:  Normal affect    Signed, Redell Leiter, MD  01/28/2024 8:40 AM    [facility]      [1]  Current Meds   Medication Sig   amLODipine -valsartan (EXFORGE) 10-320 MG tablet Take 1 tablet by mouth at bedtime.   aspirin  EC 81 MG tablet Take 1 tablet (81 mg total) by mouth daily. Swallow whole. (Patient taking differently: Take 81 mg by mouth in the morning and at bedtime. Swallow whole.)   febuxostat  (ULORIC ) 40 MG tablet Take 40 mg by mouth at bedtime.   fenofibrate  160 MG tablet Take 160 mg by mouth at bedtime.   metoprolol  tartrate (LOPRESSOR ) 50 MG tablet Take 50 mg by mouth in the morning and at bedtime.   nitroGLYCERIN  (NITROSTAT ) 0.4 MG SL tablet Place 1 tablet (0.4 mg total) under the tongue  every 5 (five) minutes as needed.   NON FORMULARY Pt uses a cpap nightly   rosuvastatin  (CRESTOR ) 20 MG tablet Take 1 tablet (20 mg total) by mouth daily.   triamcinolone  cream (KENALOG) 0.1 % Apply 1 Application topically 3 (three) times daily.

## 2024-01-28 ENCOUNTER — Ambulatory Visit: Attending: Cardiology | Admitting: Cardiology

## 2024-01-28 ENCOUNTER — Encounter: Payer: Self-pay | Admitting: Cardiology

## 2024-01-28 VITALS — BP 140/64 | HR 56 | Ht 72.0 in | Wt 238.4 lb

## 2024-01-28 DIAGNOSIS — I2510 Atherosclerotic heart disease of native coronary artery without angina pectoris: Secondary | ICD-10-CM

## 2024-01-28 DIAGNOSIS — J8410 Pulmonary fibrosis, unspecified: Secondary | ICD-10-CM

## 2024-01-28 DIAGNOSIS — E782 Mixed hyperlipidemia: Secondary | ICD-10-CM

## 2024-01-28 DIAGNOSIS — Z951 Presence of aortocoronary bypass graft: Secondary | ICD-10-CM

## 2024-01-28 NOTE — Patient Instructions (Signed)

## 2024-01-31 ENCOUNTER — Ambulatory Visit: Admitting: Cardiology

## 2024-02-18 NOTE — Progress Notes (Deleted)
 " Cardiology Office Note:    Date:  02/18/2024   ID:  Phillip Terry, DOB 1945/08/17, MRN [id number]  PCP:  Keren Vicenta BRAVO, MD  Cardiologist:  Redell Leiter, MD    Referring MD: Keren Vicenta BRAVO, MD    ASSESSMENT:    No diagnosis found. PLAN:    In order of problems listed above:  ***   Next appointment: ***   Medication Adjustments/Labs and Tests Ordered: Current medicines are reviewed at length with the patient today.  Concerns regarding medicines are outlined above.  No orders of the defined types were placed in this encounter.  No orders of the defined types were placed in this encounter.    History of Present Illness:    Phillip Terry is a 79 y.o. male with a hx of CAD with CABG August 2022 for multivessel CAD pulmonary fibrosis hypertension hyperlipidemia and paroxysmal atrial fibrillation  last seen 01/28/24.He underwent recent left heart catheterization 01/08/2024 that showed the presence of a patent left thoracic artery to the LAD vein graft to the diagonal vein graft to the PDA and there is no target for percutaneous intervention.     Compliance with diet, lifestyle and medications: *** Past Medical History:  Diagnosis Date   Abnormal nuclear stress test 01/02/2024   Atherosclerotic heart disease of native coronary artery with unspecified angina pectoris 05/04/2021   CAD (coronary artery disease)    CAD in native artery    Calculus of kidney    Chest pain 08/22/2020   Chronic heel pain 01/27/2024   DOE (dyspnea on exertion) 10/27/2019   Encounter for fitting and adjustment of hearing aid 05/04/2021   Encounter for other administrative examinations 05/04/2021   Essential hypertension    Gout 05/04/2021   Hearing loss 05/04/2021   Hepatic steatosis    History of asbestos exposure 05/04/2021   Hyperglycemia 10/07/2020   Hyperlipidemia    Impacted cerumen, bilateral 03/12/2023   Obesity 05/04/2021   OSA on CPAP 03/16/2007   RDI 39 Sat 89%   Pain in joint  involving ankle and foot 12/24/2023   Peripheral neuropathy    Pulmonary fibrosis (HCC) 10/27/2019   S/P CABG x 3 10/14/2020   Sensorineural hearing loss, bilateral 05/04/2021   Skin sensation disturbance 12/24/2023   Unstable angina (HCC) 10/07/2020    Current Medications: Active Medications[1]    EKGs/Labs/Other Studies Reviewed:    The following studies were reviewed today:  Cardiac Studies & Procedures   ______________________________________________________________________________________________ CARDIAC CATHETERIZATION  CARDIAC CATHETERIZATION 01/08/2024  Conclusion   Mid LAD lesion is 75% stenosed.   Dist Cx lesion is 50% stenosed.   Prox RCA to Mid RCA lesion is 60% stenosed.   2nd Diag lesion is 80% stenosed.   RPAV lesion is 50% stenosed.   Prox LAD lesion is 100% stenosed.   LIMA graft was visualized by angiography and is normal in caliber.   SVG graft was visualized by angiography and is normal in caliber.   SVG graft was visualized by angiography and is normal in caliber.   The graft exhibits no disease.   The graft exhibits no disease.   The graft exhibits no disease.   LV end diastolic pressure is mildly elevated.  2 vessel obstructive CAD Patent LIMA to the LAD Patent SVG to diagonal Patent SVG to PDA Mildly elevated LVEDP 18 mm Hg  Plan: no obstruction to explain findings on Myoview . Perfusion looks good. Continue medical therapy.  Findings Coronary Findings Diagnostic  Dominance: Right  Left  Anterior Descending The vessel exhibits minimal luminal irregularities. Prox LAD lesion is 100% stenosed. The lesion is severely calcified. Mid LAD lesion is 75% stenosed. The lesion is severely calcified.  Second Diagonal Branch Vessel is small in size. 2nd Diag lesion is 80% stenosed.  Left Circumflex Dist Cx lesion is 50% stenosed.  Right Coronary Artery Prox RCA to Mid RCA lesion is 60% stenosed.  Right Posterior Atrioventricular Artery RPAV  lesion is 50% stenosed.  LIMA LIMA Graft To Dist LAD LIMA graft was visualized by angiography and is normal in caliber.  The graft exhibits no disease.  Saphenous Graft To 1st Diag SVG graft was visualized by angiography and is normal in caliber.  The graft exhibits no disease.  Saphenous Graft To RPDA SVG graft was visualized by angiography and is normal in caliber.  The graft exhibits no disease.  Intervention  No interventions have been documented.   CARDIAC CATHETERIZATION  CARDIAC CATHETERIZATION 09/23/2020  Conclusion   Prox RCA to Mid RCA lesion is 60-70% stenosed.   Dist Cx lesion is 50% stenosed.   Mid LAD lesion is 75% stenosed.  This is a long segment of disease starting immediately after the large diagonal branch.   Prox LAD lesion is 90% stenosed.   2nd Diag lesion is 80% stenosed.  This is a small vessel which likely could be treated medically.   RPAV lesion is 50% stenosed.   The left ventricular systolic function is normal.   LV end diastolic pressure is normal.   The left ventricular ejection fraction is 55-65% by visual estimate.   There is no aortic valve stenosis.  Severe, calcific disease most notably in the proximal and mid LAD.  The long segment of disease in the mid LAD involves several diagonal vessel ostia.  Moderate disease in the RCA.  We will plan for cardiac surgery referral for consideration of robotic mid CAB procedure with LIMA to LAD.  I suspect his symptoms would resolve with just that revascularization.  If symptoms persisted, the RCA is certainly treatable percutaneously and a hybrid approach to his revascularization could be considered.  Severe tortuosity of the right subclavian.  If intervention was needed, would consider using long destination sheath from the right radial to engage the RCA.  Engaging the left main was much easier.  Results discussed with the patient's daughter.  Echocardiogram will be obtained.  Referral made to Dr.  Shyrl.  Findings Coronary Findings Diagnostic  Dominance: Right  Left Anterior Descending The vessel exhibits minimal luminal irregularities. Prox LAD lesion is 90% stenosed. The lesion is severely calcified. Mid LAD lesion is 75% stenosed. The lesion is severely calcified.  Second Diagonal Branch Vessel is small in size. 2nd Diag lesion is 80% stenosed.  Left Circumflex Dist Cx lesion is 50% stenosed.  Right Coronary Artery Prox RCA to Mid RCA lesion is 60% stenosed.  Right Posterior Atrioventricular Artery RPAV lesion is 50% stenosed.  Intervention  No interventions have been documented.   STRESS TESTS  MYOCARDIAL PERFUSION IMAGING 01/01/2024  Interpretation Summary   Findings are consistent with ischemia. The study is intermediate risk.   LV perfusion is abnormal. There is evidence of ischemia. Defect 1: There is a medium defect with moderate reduction in uptake present in the inferior location(s) that is reversible. There is normal wall motion in the defect area. Consistent with ischemia.   Left ventricular function is normal. Nuclear stress EF: 64%. The left ventricular ejection fraction is normal (55-65%). End diastolic cavity size  is normal.   ECHOCARDIOGRAM  ECHOCARDIOGRAM COMPLETE 10/05/2022  Narrative ECHOCARDIOGRAM REPORT    Patient Name:   Phillip Terry Date of Exam: 10/05/2022 Medical Rec #:  [id number]  Height:       72.0 in Accession #:    [id number] Weight:       242.4 lb Date of Birth:  1945/09/28  BSA:          2.312 m Patient Age:    77 years   BP:           132/66 mmHg Patient Gender: M          HR:           62 bpm. Exam Location:  [place]  Procedure: 2D Echo, Cardiac Doppler, Color Doppler and Strain Analysis  Indications:    Coronary artery disease of native artery of native heart with stable angina pectoris (HCC) [I25.118 (ICD-10-CM)]; S/P CABG x 3 [Z95.1 (ICD-10-CM)]; Essential hypertension [I10 (ICD-10-CM)]; Mixed hyperlipidemia  [E78.2 (ICD-10-CM)]; PAF (paroxysmal atrial fibrillation) (HCC) [I48.0 (ICD-10-CM)]; Pulmonary fibrosis (HCC) [J84.10 (ICD-10-CM)]  History:        Patient has prior history of Echocardiogram examinations, most recent 10/07/2020. Prior CABG, Arrythmias:Atrial Fibrillation; Risk Factors:Hypertension and Dyslipidemia.  Sonographer:    Charlie Jointer RDCS Referring Phys: [id number] Loucile Posner J [facility] [employer]  IMPRESSIONS   1. Left ventricular ejection fraction, by estimation, is 55 to 60%. The left ventricle has normal function. The left ventricle has no regional wall motion abnormalities. There is mild concentric left ventricular hypertrophy. Left ventricular diastolic parameters were normal. 2. Right ventricular systolic function is normal. The right ventricular size is mildly enlarged. There is normal pulmonary artery systolic pressure. 3. The mitral valve is normal in structure. Mild mitral valve regurgitation. No evidence of mitral stenosis. 4. The aortic valve is tricuspid. Aortic valve regurgitation is not visualized. No aortic stenosis is present. 5. Aortic Normal DTA. There is moderate dilatation of the ascending aorta and of the aortic root, measuring 40 mm. 6. The inferior vena cava is normal in size with greater than 50% respiratory variability, suggesting right atrial pressure of 3 mmHg.  FINDINGS Left Ventricle: Left ventricular ejection fraction, by estimation, is 55 to 60%. The left ventricle has normal function. The left ventricle has no regional wall motion abnormalities. Global longitudinal strain performed but not reported based on interpreter judgement due to suboptimal tracking. The left ventricular internal cavity size was normal in size. There is mild concentric left ventricular hypertrophy. Left ventricular diastolic parameters were normal. Indeterminate filling pressures.  Right Ventricle: The right ventricular size is mildly enlarged. No increase in right ventricular wall  thickness. Right ventricular systolic function is normal. There is normal pulmonary artery systolic pressure. The tricuspid regurgitant velocity is 2.35 m/s, and with an assumed right atrial pressure of 8 mmHg, the estimated right ventricular systolic pressure is 30.1 mmHg.  Left Atrium: Left atrial size was normal in size.  Right Atrium: Right atrial size was normal in size.  Pericardium: There is no evidence of pericardial effusion.  Mitral Valve: The mitral valve is normal in structure. Mild mitral valve regurgitation. No evidence of mitral valve stenosis.  Tricuspid Valve: The tricuspid valve is normal in structure. Tricuspid valve regurgitation is mild . No evidence of tricuspid stenosis.  Aortic Valve: The aortic valve is tricuspid. Aortic valve regurgitation is not visualized. No aortic stenosis is present.  Pulmonic Valve: The pulmonic valve was normal in structure. Pulmonic valve regurgitation is not visualized. No evidence of  pulmonic stenosis.  Aorta: The aortic arch was not well visualized, the aortic root is normal in size and structure and Normal DTA. There is moderate dilatation of the ascending aorta and of the aortic root, measuring 40 mm.  Venous: A normal flow pattern is recorded from the right upper pulmonary vein. The inferior vena cava is normal in size with greater than 50% respiratory variability, suggesting right atrial pressure of 3 mmHg.  IAS/Shunts: No atrial level shunt detected by color flow Doppler.   LEFT VENTRICLE PLAX 2D LVIDd:         4.70 cm   Diastology LVIDs:         3.40 cm   LV e' medial:   6.09 cm/s LV PW:         1.20 cm   LV E/e' medial: 16.1 LV IVS:        1.20 cm LVOT diam:     2.30 cm LV SV:         97 LV SV Index:   42 LVOT Area:     4.15 cm   RIGHT VENTRICLE            IVC RV Basal diam:  4.50 cm    IVC diam: 2.30 cm RV Mid diam:    4.20 cm RV S prime:     8.49 cm/s TAPSE (M-mode): 2.0 cm  LEFT ATRIUM             Index         RIGHT ATRIUM           Index LA diam:        4.70 cm 2.03 cm/m   RA Area:     16.40 cm LA Vol (A2C):   58.5 ml 25.30 ml/m  RA Volume:   38.70 ml  16.74 ml/m LA Vol (A4C):   60.0 ml 25.95 ml/m LA Biplane Vol: 60.8 ml 26.30 ml/m AORTIC VALVE             PULMONIC VALVE LVOT Vmax:   99.27 cm/s  PR End Diast Vel: 6.15 msec LVOT Vmean:  66.800 cm/s LVOT VTI:    0.234 m  AORTA Ao Root diam: 4.20 cm Ao Asc diam:  3.90 cm Ao Desc diam: 2.10 cm  MITRAL VALVE               TRICUSPID VALVE MV Area (PHT): 3.51 cm    TR Peak grad:   22.1 mmHg MV Decel Time: 216 msec    TR Vmax:        235.00 cm/s MV E velocity: 98.17 cm/s MV A velocity: 85.33 cm/s  SHUNTS MV E/A ratio:  1.15        Systemic VTI:  0.23 m Systemic Diam: 2.30 cm  Redell Leiter MD Electronically signed by Redell Leiter MD Signature Date/Time: 10/05/2022/11:58:13 AM    Final   TEE  ECHO INTRAOPERATIVE TEE 10/11/2020  Narrative *INTRAOPERATIVE TRANSESOPHAGEAL REPORT *    Patient Name:   Phillip Terry     Date of Exam: 10/11/2020 Medical Rec #:  [id number]      Height:       72.0 in Accession #:    [id number]     Weight:       240.7 lb Date of Birth:  [birth year]      BSA:          2.31 m Patient Age:    75 years       BP:  160/70 mmHg Patient Gender: M              HR:           70 bpm. Exam Location:  Anesthesiology  Transesophogeal exam was perform intraoperatively during surgical procedure. Patient was closely monitored under general anesthesia during the entirety of examination.  Indications:     I25.110 Atherosclerotic heart disease of native coronary artery with unstable angina pectoris Sonographer:     Lyle Marc RDCS Performing Phys: Debby Like MD Diagnosing Phys: Debby Like MD  Complications: No known complications during this procedure. POST-OP IMPRESSIONS _ Left Ventricle: The left ventricle is unchanged from pre-bypass. _ Right Ventricle: The right ventricle appears unchanged from  pre-bypass. _ Aorta: The aorta appears unchanged from pre-bypass. _ Left Atrium: The left atrium appears unchanged from pre-bypass. _ Left Atrial Appendage: The left atrial appendage appears unchanged from pre-bypass. _ Aortic Valve: The aortic valve appears unchanged from pre-bypass. _ Mitral Valve: The mitral valve appears unchanged from pre-bypass. _ Tricuspid Valve: The tricuspid valve appears unchanged from pre-bypass. _ Pulmonic Valve: The pulmonic valve appears unchanged from pre-bypass. _ Interatrial Septum: The interatrial septum appears unchanged from pre-bypass. _ Interventricular Septum: The interventricular septum appears unchanged from pre-bypass. _ Pericardium: The pericardium appears unchanged from pre-bypass.  PRE-OP FINDINGS Left Ventricle: The left ventricle has normal systolic function, with an ejection fraction of 60-65%. The cavity size was normal. There is no left ventricular hypertrophy.   Right Ventricle: The right ventricle has normal systolic function. The cavity was normal. There is no increase in right ventricular wall thickness.  Left Atrium: Left atrial size was normal in size. No left atrial/left atrial appendage thrombus was detected. There is continuous echo contrast seen in the left atrial cavity.  Right Atrium: Right atrial size was normal in size. Prominent Chiari network.  Interatrial Septum: No atrial level shunt detected by color flow Doppler.  Pericardium: There is no evidence of pericardial effusion.  Mitral Valve: The mitral valve is normal in structure. Mitral valve regurgitation is trivial by color flow Doppler. There is No evidence of mitral stenosis.  Tricuspid Valve: The tricuspid valve was normal in structure. Tricuspid valve regurgitation is trivial by color flow Doppler. No evidence of tricuspid stenosis is present.  Aortic Valve: The aortic valve is tricuspid Aortic valve regurgitation was not visualized by color flow Doppler. There  is no stenosis of the aortic valve.   Pulmonic Valve: The pulmonic valve was not assessed. Pulmonic valve regurgitation was not assessed by color flow Doppler.    Debby Like MD Electronically signed by Debby Like MD Signature Date/Time: 10/11/2020/5:15:53 PM    Final  MONITORS  LONG TERM MONITOR (3-14 DAYS) 12/06/2020  Narrative Patch Wear Time:  3 days and 0 hours (2022-10-14T19:56:03-398 to 2022-10-17T19:58:35-0400)  Patient had a min HR of 47 bpm, max HR of 115 bpm, and avg HR of 62 bpm. Predominant underlying rhythm was Sinus Rhythm. 2 Supraventricular Tachycardia runs occurred, the run with the fastest interval lasting 4 beats with a max rate of 115 bpm, the longest lasting 5 beats with an avg rate of 100 bpm. Isolated SVEs were rare (<1.0%), SVE Couplets were rare (<1.0%), and SVE Triplets were rare (<1.0%). Isolated VEs were rare (<1.0%), VE Couplets were rare (<1.0%), and no VE Triplets were present.  Ventricular ectopy was rare Supraventricular ectopy is rare no episodes of atrial fibrillation or flutter.  There were 2 brief episodes of atrial tachycardia longest 5 beats and duration  heart rate of 100 bpm.  There were no triggered or diary events  Conclusion unremarkable 3-day event monitor   CT SCANS  CT CORONARY MORPH W/CTA COR W/SCORE 09/13/2020  Addendum 09/13/2020  7:03 PM ADDENDUM REPORT: 09/13/2020 19:01  EXAM: OVER-READ INTERPRETATION  CT CHEST  The following report is an over-read performed by radiologist Dr. Isla Qua [facility] on 09/13/2020. This over-read does not include interpretation of cardiac or coronary anatomy or pathology. The coronary CT a and coronary calcium  interpretation by the cardiologist is attached. Chest with limited coverage through the heart including the mid chest only.  COMPARISON:  Chest CT from September 2021  FINDINGS: Cardiovascular: Please see dedicated report from cardiology for further detail  regarding cardiac findings.  Mediastinum/Nodes: No adenopathy in the visible mediastinum or hila. Esophagus grossly unremarkable with respect to visualized portions.  Lungs/Pleura: Mild subpleural reticulation bilaterally. Potential interstitial lung disease as outlined in prior CT imaging. No effusion. No consolidative changes. Airways, visible airways are patent.  Upper Abdomen: Incidental imaging of upper abdominal contents with signs of hepatic steatosis, moderate to marked. No acute upper abdominal process on very limited assessment.  Musculoskeletal: Spinal degenerative changes. No acute or destructive bone process.  IMPRESSION: 1. Suspicion for interstitial lung disease as outlined in prior CT imaging. 2. Hepatic steatosis.   Electronically Signed By: Isla Blind M.D. On: 09/13/2020 19:01  Narrative CLINICAL DATA:  79 year old with angina.  EXAM: Cardiac/Coronary  CTA  TECHNIQUE: The patient was scanned on a [employer].  FINDINGS: A 130 kV prospective scan was triggered in the descending thoracic aorta at 111 HU's. Axial non-contrast 3 mm slices were carried out through the heart. The data set was analyzed on a dedicated work station and scored using the Agatson method. Gantry rotation speed was 250 msecs and collimation was .6 mm. Beta blockade and 0.8 mg of sl NTG was given. The 3D data set was reconstructed in 5% intervals of the 67-82 % of the R-R cycle. Diastolic phases were analyzed on a dedicated work station using MPR, MIP and VRT modes. The patient received 80 cc of contrast.  Poor image quality: Misregistration artifact significant.  Aorta:  Normal size.  Aortic atherosclerosis.  Aortic Valve: No calcifications.  Coronary Arteries:  Normal coronary origin.  Right dominance.  RCA is a large dominant artery that gives rise to PDA and PLA. There is significant calcified plaque (Unable to interpret given  severe misregistration).  Left main is a large calcified artery that gives rise to LAD and LCX arteries. Unable to interpret given severe misregistration at the level of left main.  LAD - diffuse calcified plaque. Unable to interpret lumen given severe misregistration  LCX is a calcified vessel. Unable to interpret given severe misregistration.  Other findings:  Normal pulmonary vein drainage into the left atrium.  Normal left atrial appendage without a thrombus.  Normal size of the pulmonary artery.  Air from injection noted in apex of right ventricle.  Please see radiology report for non cardiac findings.  IMPRESSION: 1. Coronary calcium  score of 2605. This was 72 percentile for age and sex matched control.  2. Normal coronary origin with right dominance.  3. Severe multivessel coronary plaque. Unable to interpret lumen given severe misregistration. Recommend cardiac catheterization for further clarification.  Electronically Signed: By: Oneil Parchment MD On: 09/13/2020 11:38     ______________________________________________________________________________________________          Recent Labs: 05/06/2023: ALT 20 01/02/2024: BUN  14; Creatinine, Ser 1.03; Hemoglobin 14.8; Platelets 157; Potassium 4.4; Sodium 139  Recent Lipid Panel    Component Value Date/Time   CHOL 118 05/06/2023 0821   TRIG 139 05/06/2023 0821   HDL 31 (L) 05/06/2023 0821   CHOLHDL 3.8 05/06/2023 0821   CHOLHDL 2.6 10/09/2020 0325   VLDL 23 10/09/2020 0325   LDLCALC 62 05/06/2023 0821    Physical Exam:    VS:  There were no vitals taken for this visit.    Wt Readings from Last 3 Encounters:  01/28/24 238 lb 6.4 oz (108.1 kg)  01/08/24 240 lb (108.9 kg)  01/02/24 237 lb (107.5 kg)     GEN: *** Well nourished, well developed in no acute distress HEENT: Normal NECK: No JVD; No carotid bruits LYMPHATICS: No lymphadenopathy CARDIAC: ***RRR, no murmurs, rubs, gallops RESPIRATORY:   Clear to auscultation without rales, wheezing or rhonchi  ABDOMEN: Soft, non-tender, non-distended MUSCULOSKELETAL:  No edema; No deformity  SKIN: Warm and dry NEUROLOGIC:  Alert and oriented x 3 PSYCHIATRIC:  Normal affect    Signed, Redell Leiter, MD  02/18/2024 7:19 PM    [facility]     [1]  No outpatient medications have been marked as taking for the 02/19/24 encounter (Appointment) with Leiter Redell PARAS, MD.   "

## 2024-02-19 ENCOUNTER — Ambulatory Visit: Admitting: Cardiology
